# Patient Record
Sex: Female | Born: 1965 | Race: Black or African American | Hispanic: No | Marital: Single | Smoking: Never smoker
Health system: Southern US, Community
[De-identification: ages and names within clinical notes are randomized; demographics above are authoritative.]

## PROBLEM LIST (undated history)

## (undated) DIAGNOSIS — G5600 Carpal tunnel syndrome, unspecified upper limb: Secondary | ICD-10-CM

## (undated) DIAGNOSIS — I10 Essential (primary) hypertension: Secondary | ICD-10-CM

## (undated) DIAGNOSIS — K259 Gastric ulcer, unspecified as acute or chronic, without hemorrhage or perforation: Secondary | ICD-10-CM

## (undated) DIAGNOSIS — K219 Gastro-esophageal reflux disease without esophagitis: Secondary | ICD-10-CM

## (undated) DIAGNOSIS — F32A Depression, unspecified: Secondary | ICD-10-CM

## (undated) DIAGNOSIS — Z8619 Personal history of other infectious and parasitic diseases: Secondary | ICD-10-CM

## (undated) DIAGNOSIS — G4730 Sleep apnea, unspecified: Secondary | ICD-10-CM

## (undated) DIAGNOSIS — M779 Enthesopathy, unspecified: Secondary | ICD-10-CM

## (undated) DIAGNOSIS — M1990 Unspecified osteoarthritis, unspecified site: Secondary | ICD-10-CM

## (undated) DIAGNOSIS — R519 Headache, unspecified: Secondary | ICD-10-CM

## (undated) DIAGNOSIS — M722 Plantar fascial fibromatosis: Secondary | ICD-10-CM

## (undated) DIAGNOSIS — R51 Headache: Secondary | ICD-10-CM

## (undated) DIAGNOSIS — M7580 Other shoulder lesions, unspecified shoulder: Secondary | ICD-10-CM

## (undated) DIAGNOSIS — F419 Anxiety disorder, unspecified: Secondary | ICD-10-CM

## (undated) DIAGNOSIS — M7730 Calcaneal spur, unspecified foot: Secondary | ICD-10-CM

## (undated) DIAGNOSIS — F329 Major depressive disorder, single episode, unspecified: Secondary | ICD-10-CM

## (undated) DIAGNOSIS — F410 Panic disorder [episodic paroxysmal anxiety] without agoraphobia: Secondary | ICD-10-CM

## (undated) DIAGNOSIS — D573 Sickle-cell trait: Secondary | ICD-10-CM

## (undated) HISTORY — PX: WISDOM TOOTH EXTRACTION: SHX21

## (undated) HISTORY — DX: Calcaneal spur, unspecified foot: M77.30

## (undated) HISTORY — PX: CHOLECYSTECTOMY: SHX55

## (undated) HISTORY — DX: Carpal tunnel syndrome, unspecified upper limb: G56.00

## (undated) HISTORY — DX: Anxiety disorder, unspecified: F41.9

## (undated) HISTORY — DX: Gastric ulcer, unspecified as acute or chronic, without hemorrhage or perforation: K25.9

## (undated) HISTORY — DX: Personal history of other infectious and parasitic diseases: Z86.19

## (undated) HISTORY — DX: Plantar fascial fibromatosis: M72.2

## (undated) HISTORY — DX: Gastro-esophageal reflux disease without esophagitis: K21.9

---

## 2001-05-06 ENCOUNTER — Emergency Department (HOSPITAL_COMMUNITY): Admission: EM | Admit: 2001-05-06 | Discharge: 2001-05-06 | Payer: Self-pay | Admitting: Emergency Medicine

## 2001-06-30 ENCOUNTER — Emergency Department (HOSPITAL_COMMUNITY): Admission: EM | Admit: 2001-06-30 | Discharge: 2001-06-30 | Payer: Self-pay | Admitting: Emergency Medicine

## 2001-10-25 ENCOUNTER — Emergency Department (HOSPITAL_COMMUNITY): Admission: EM | Admit: 2001-10-25 | Discharge: 2001-10-26 | Payer: Self-pay | Admitting: Emergency Medicine

## 2001-10-26 ENCOUNTER — Encounter: Payer: Self-pay | Admitting: Emergency Medicine

## 2002-05-30 ENCOUNTER — Emergency Department (HOSPITAL_COMMUNITY): Admission: EM | Admit: 2002-05-30 | Discharge: 2002-05-30 | Payer: Self-pay | Admitting: *Deleted

## 2002-05-30 ENCOUNTER — Encounter: Payer: Self-pay | Admitting: *Deleted

## 2002-06-04 ENCOUNTER — Encounter: Admission: RE | Admit: 2002-06-04 | Discharge: 2002-06-04 | Payer: Self-pay | Admitting: Internal Medicine

## 2003-04-24 ENCOUNTER — Emergency Department (HOSPITAL_COMMUNITY): Admission: EM | Admit: 2003-04-24 | Discharge: 2003-04-24 | Payer: Self-pay | Admitting: Emergency Medicine

## 2003-06-08 ENCOUNTER — Emergency Department (HOSPITAL_COMMUNITY): Admission: EM | Admit: 2003-06-08 | Discharge: 2003-06-09 | Payer: Self-pay | Admitting: Emergency Medicine

## 2003-06-18 ENCOUNTER — Encounter: Admission: RE | Admit: 2003-06-18 | Discharge: 2003-06-18 | Payer: Self-pay | Admitting: Internal Medicine

## 2004-02-19 ENCOUNTER — Encounter: Admission: RE | Admit: 2004-02-19 | Discharge: 2004-02-19 | Payer: Self-pay | Admitting: Internal Medicine

## 2004-06-17 ENCOUNTER — Encounter: Admission: RE | Admit: 2004-06-17 | Discharge: 2004-06-17 | Payer: Self-pay | Admitting: Internal Medicine

## 2004-07-05 ENCOUNTER — Emergency Department (HOSPITAL_COMMUNITY): Admission: EM | Admit: 2004-07-05 | Discharge: 2004-07-05 | Payer: Self-pay | Admitting: Emergency Medicine

## 2004-08-29 HISTORY — PX: TUBAL LIGATION: SHX77

## 2004-09-08 DIAGNOSIS — Z87448 Personal history of other diseases of urinary system: Secondary | ICD-10-CM | POA: Insufficient documentation

## 2004-09-08 DIAGNOSIS — M722 Plantar fascial fibromatosis: Secondary | ICD-10-CM

## 2004-09-23 ENCOUNTER — Inpatient Hospital Stay (HOSPITAL_COMMUNITY): Admission: AD | Admit: 2004-09-23 | Discharge: 2004-09-23 | Payer: Self-pay | Admitting: Obstetrics and Gynecology

## 2005-01-06 ENCOUNTER — Emergency Department (HOSPITAL_COMMUNITY): Admission: EM | Admit: 2005-01-06 | Discharge: 2005-01-06 | Payer: Self-pay | Admitting: Family Medicine

## 2005-03-31 ENCOUNTER — Ambulatory Visit: Payer: Self-pay | Admitting: Internal Medicine

## 2005-04-30 ENCOUNTER — Emergency Department (HOSPITAL_COMMUNITY): Admission: EM | Admit: 2005-04-30 | Discharge: 2005-05-01 | Payer: Self-pay | Admitting: Emergency Medicine

## 2005-08-30 ENCOUNTER — Emergency Department (HOSPITAL_COMMUNITY): Admission: EM | Admit: 2005-08-30 | Discharge: 2005-08-30 | Payer: Self-pay | Admitting: Family Medicine

## 2006-12-13 ENCOUNTER — Emergency Department (HOSPITAL_COMMUNITY): Admission: EM | Admit: 2006-12-13 | Discharge: 2006-12-13 | Payer: Self-pay | Admitting: Family Medicine

## 2007-02-27 ENCOUNTER — Ambulatory Visit: Payer: Self-pay | Admitting: *Deleted

## 2007-03-01 ENCOUNTER — Ambulatory Visit: Payer: Self-pay | Admitting: Sports Medicine

## 2007-03-01 DIAGNOSIS — I10 Essential (primary) hypertension: Secondary | ICD-10-CM | POA: Insufficient documentation

## 2007-03-06 ENCOUNTER — Telehealth: Payer: Self-pay | Admitting: Sports Medicine

## 2007-03-15 ENCOUNTER — Ambulatory Visit: Payer: Self-pay | Admitting: Sports Medicine

## 2007-03-15 DIAGNOSIS — M25579 Pain in unspecified ankle and joints of unspecified foot: Secondary | ICD-10-CM

## 2007-03-29 ENCOUNTER — Telehealth: Payer: Self-pay | Admitting: *Deleted

## 2007-05-02 ENCOUNTER — Emergency Department (HOSPITAL_COMMUNITY): Admission: EM | Admit: 2007-05-02 | Discharge: 2007-05-02 | Payer: Self-pay | Admitting: Emergency Medicine

## 2007-05-02 ENCOUNTER — Telehealth: Payer: Self-pay | Admitting: *Deleted

## 2007-12-07 ENCOUNTER — Emergency Department (HOSPITAL_COMMUNITY): Admission: EM | Admit: 2007-12-07 | Discharge: 2007-12-07 | Payer: Self-pay | Admitting: Family Medicine

## 2008-04-05 ENCOUNTER — Telehealth: Payer: Self-pay | Admitting: *Deleted

## 2008-04-16 ENCOUNTER — Emergency Department (HOSPITAL_COMMUNITY): Admission: EM | Admit: 2008-04-16 | Discharge: 2008-04-16 | Payer: Self-pay | Admitting: Family Medicine

## 2008-04-23 ENCOUNTER — Ambulatory Visit: Payer: Self-pay | Admitting: Sports Medicine

## 2008-04-23 DIAGNOSIS — M7660 Achilles tendinitis, unspecified leg: Secondary | ICD-10-CM | POA: Insufficient documentation

## 2008-05-23 ENCOUNTER — Ambulatory Visit: Payer: Self-pay | Admitting: Internal Medicine

## 2008-05-23 DIAGNOSIS — R519 Headache, unspecified: Secondary | ICD-10-CM | POA: Insufficient documentation

## 2008-05-23 DIAGNOSIS — R51 Headache: Secondary | ICD-10-CM

## 2008-05-23 DIAGNOSIS — G4700 Insomnia, unspecified: Secondary | ICD-10-CM

## 2008-05-27 ENCOUNTER — Ambulatory Visit (HOSPITAL_COMMUNITY): Admission: RE | Admit: 2008-05-27 | Discharge: 2008-05-27 | Payer: Self-pay | Admitting: Internal Medicine

## 2008-05-28 ENCOUNTER — Encounter (INDEPENDENT_AMBULATORY_CARE_PROVIDER_SITE_OTHER): Payer: Self-pay | Admitting: Internal Medicine

## 2008-08-29 ENCOUNTER — Ambulatory Visit: Payer: Self-pay | Admitting: Sports Medicine

## 2008-09-23 ENCOUNTER — Ambulatory Visit: Payer: Self-pay | Admitting: Sports Medicine

## 2008-10-30 ENCOUNTER — Emergency Department (HOSPITAL_COMMUNITY): Admission: EM | Admit: 2008-10-30 | Discharge: 2008-10-31 | Payer: Self-pay | Admitting: Emergency Medicine

## 2009-04-04 ENCOUNTER — Emergency Department (HOSPITAL_COMMUNITY): Admission: EM | Admit: 2009-04-04 | Discharge: 2009-04-04 | Payer: Self-pay | Admitting: Emergency Medicine

## 2009-05-01 ENCOUNTER — Telehealth (INDEPENDENT_AMBULATORY_CARE_PROVIDER_SITE_OTHER): Payer: Self-pay | Admitting: *Deleted

## 2009-06-18 ENCOUNTER — Telehealth: Payer: Self-pay | Admitting: Sports Medicine

## 2009-07-30 ENCOUNTER — Ambulatory Visit: Payer: Self-pay | Admitting: Internal Medicine

## 2009-11-07 ENCOUNTER — Emergency Department (HOSPITAL_COMMUNITY): Admission: EM | Admit: 2009-11-07 | Discharge: 2009-11-07 | Payer: Self-pay | Admitting: Emergency Medicine

## 2009-12-19 ENCOUNTER — Emergency Department (HOSPITAL_COMMUNITY): Admission: EM | Admit: 2009-12-19 | Discharge: 2009-12-19 | Payer: Self-pay | Admitting: Emergency Medicine

## 2010-01-12 ENCOUNTER — Emergency Department (HOSPITAL_COMMUNITY): Admission: EM | Admit: 2010-01-12 | Discharge: 2010-01-12 | Payer: Self-pay | Admitting: Emergency Medicine

## 2010-01-19 ENCOUNTER — Emergency Department (HOSPITAL_COMMUNITY): Admission: EM | Admit: 2010-01-19 | Discharge: 2010-01-19 | Payer: Self-pay | Admitting: Emergency Medicine

## 2010-02-24 ENCOUNTER — Emergency Department (HOSPITAL_COMMUNITY): Admission: EM | Admit: 2010-02-24 | Discharge: 2010-02-24 | Payer: Self-pay | Admitting: Family Medicine

## 2010-07-02 ENCOUNTER — Emergency Department (HOSPITAL_COMMUNITY): Admission: EM | Admit: 2010-07-02 | Discharge: 2010-07-02 | Payer: Self-pay | Admitting: Family Medicine

## 2010-09-06 ENCOUNTER — Emergency Department (HOSPITAL_COMMUNITY): Admission: EM | Admit: 2010-09-06 | Discharge: 2010-09-07 | Payer: Self-pay | Admitting: Emergency Medicine

## 2010-12-30 ENCOUNTER — Emergency Department (HOSPITAL_COMMUNITY)
Admission: EM | Admit: 2010-12-30 | Discharge: 2010-12-30 | Disposition: A | Discharge status: Home / Self Care | Payer: Self-pay | Attending: Emergency Medicine | Admitting: Emergency Medicine

## 2010-12-30 DIAGNOSIS — J029 Acute pharyngitis, unspecified: Secondary | ICD-10-CM | POA: Insufficient documentation

## 2010-12-30 DIAGNOSIS — I10 Essential (primary) hypertension: Secondary | ICD-10-CM | POA: Insufficient documentation

## 2010-12-30 LAB — RAPID STREP SCREEN (MED CTR MEBANE ONLY): Streptococcus, Group A Screen (Direct): NEGATIVE

## 2011-01-05 LAB — POCT I-STAT, CHEM 8
BUN: 7 mg/dL (ref 6–23)
Chloride: 106 mEq/L (ref 96–112)
HCT: 38 % (ref 36.0–46.0)
Potassium: 3.6 mEq/L (ref 3.5–5.1)

## 2011-01-05 LAB — POCT CARDIAC MARKERS
CKMB, poc: <1 ng/mL — ABNORMAL LOW (ref 1.0–8.0)
Myoglobin, poc: 52.6 ng/mL (ref 12–200)
Troponin i, poc: <0.05 ng/mL (ref 0.00–0.09)

## 2011-01-05 LAB — DIFFERENTIAL
Basophils Absolute: 0 10*3/uL (ref 0.0–0.1)
Basophils Relative: 1 % (ref 0–1)
Lymphocytes Relative: 30 % (ref 12–46)
Monocytes Absolute: 0.6 10*3/uL (ref 0.1–1.0)
Monocytes Relative: 7 % (ref 3–12)
Neutro Abs: 4.6 10*3/uL (ref 1.7–7.7)
Neutrophils Relative %: 60 % (ref 43–77)

## 2011-01-05 LAB — CBC
HCT: 35 % — ABNORMAL LOW (ref 36.0–46.0)
Hemoglobin: 11.5 g/dL — ABNORMAL LOW (ref 12.0–15.0)
MCHC: 32.9 g/dL (ref 30.0–36.0)
RDW: 16.4 % — ABNORMAL HIGH (ref 11.5–15.5)
WBC: 7.7 10*3/uL (ref 4.0–10.5)

## 2011-01-07 LAB — WET PREP, GENITAL
Trich, Wet Prep: NONE SEEN
Yeast Wet Prep HPF POC: NONE SEEN

## 2011-01-07 LAB — POCT URINALYSIS DIPSTICK
Hgb urine dipstick: NEGATIVE
Ketones, ur: NEGATIVE mg/dL
Protein, ur: NEGATIVE mg/dL
Specific Gravity, Urine: 1.015 (ref 1.005–1.030)
Urobilinogen, UA: 0.2 mg/dL (ref 0.0–1.0)
pH: 7 (ref 5.0–8.0)

## 2011-01-07 LAB — POCT PREGNANCY, URINE: Preg Test, Ur: NEGATIVE

## 2011-03-12 ENCOUNTER — Encounter: Payer: Self-pay | Admitting: Internal Medicine

## 2011-07-16 LAB — POCT URINALYSIS DIP (DEVICE)
Nitrite: NEGATIVE
Operator id: 247071
Protein, ur: NEGATIVE
Specific Gravity, Urine: 1.015
Urobilinogen, UA: 0.2

## 2011-07-16 LAB — URINE CULTURE

## 2012-11-06 ENCOUNTER — Emergency Department (HOSPITAL_COMMUNITY): Payer: Self-pay

## 2012-11-06 ENCOUNTER — Encounter (HOSPITAL_COMMUNITY): Payer: Self-pay

## 2012-11-06 ENCOUNTER — Emergency Department (HOSPITAL_COMMUNITY)
Admission: EM | Admit: 2012-11-06 | Discharge: 2012-11-06 | Disposition: A | Discharge status: Home / Self Care | Payer: Self-pay | Attending: Emergency Medicine | Admitting: Emergency Medicine

## 2012-11-06 DIAGNOSIS — Y929 Unspecified place or not applicable: Secondary | ICD-10-CM | POA: Insufficient documentation

## 2012-11-06 DIAGNOSIS — S86019A Strain of unspecified Achilles tendon, initial encounter: Secondary | ICD-10-CM

## 2012-11-06 DIAGNOSIS — Y939 Activity, unspecified: Secondary | ICD-10-CM | POA: Insufficient documentation

## 2012-11-06 DIAGNOSIS — Z8739 Personal history of other diseases of the musculoskeletal system and connective tissue: Secondary | ICD-10-CM | POA: Insufficient documentation

## 2012-11-06 DIAGNOSIS — W19XXXA Unspecified fall, initial encounter: Secondary | ICD-10-CM | POA: Insufficient documentation

## 2012-11-06 DIAGNOSIS — S93499A Sprain of other ligament of unspecified ankle, initial encounter: Secondary | ICD-10-CM | POA: Insufficient documentation

## 2012-11-06 HISTORY — DX: Other shoulder lesions, unspecified shoulder: M75.80

## 2012-11-06 HISTORY — DX: Enthesopathy, unspecified: M77.9

## 2012-11-06 MED ORDER — HYDROCODONE-ACETAMINOPHEN 5-325 MG PO TABS
2.0000 | ORAL_TABLET | Freq: Once | ORAL | Status: AC
  Administered 2012-11-06: 2 via ORAL
  Filled 2012-11-06: qty 2

## 2012-11-06 MED ORDER — HYDROCODONE-ACETAMINOPHEN 5-325 MG PO TABS: 2.0000 | ORAL_TABLET | ORAL | Status: DC | PRN

## 2012-11-06 NOTE — ED Notes (Signed)
Paged ortho 

## 2012-11-06 NOTE — Progress Notes (Signed)
Orthopedic Tech Progress Note Patient Details:  Amy Perez Jan 30, 1966 [id number]  Ortho Devices Type of Ortho Device: CAM walker Ortho Device/Splint Location: (R) LE Ortho Device/Splint Interventions: Application   Jennye Moccasin 11/06/2012, 6:28 PM

## 2012-11-06 NOTE — ED Notes (Signed)
Pt states she fell last week and thought her foot would get better but presents for increase in pain to her right foot and pain in both hands with hx of tendonitis. Has bone spurs so unsure if it made it worse in the foot.

## 2012-11-06 NOTE — ED Notes (Signed)
Ortho tech at bedside 

## 2012-11-06 NOTE — ED Notes (Addendum)
Pt given d/c teaching and prescriptions. Pt verbalizes understanding of d/c teaching and instructions on follow up care tomorrow. Pt has no further questions upon d/c and does not appear to be in acute distress upon d/c. Pt instructed not to drive due to pain medication and states nephew will be picking her up from ED.

## 2012-11-06 NOTE — ED Provider Notes (Addendum)
History   This chart was scribed for Doug Sou, MD by Gerlean Ren, ED Scribe. This patient was seen in room TR07C/TR07C and the patient's care was started at 5:53 PM    CSN: [id number]  Arrival date & time 11/06/12  1516   None     Chief Complaint  Patient presents with  . Foot Pain     The history is provided by the patient. No language interpreter was used.   Amy Perez is a 47 y.o. female who presents to the Emergency Department complaining of constant, gradually worsening, non-radiating right heel pain after falling backwards last week that was initially painful but pt was not concerned due to h/o painful heel spurs.  Pain is worse with weightbearing to improve by Pt denies any further injury as result of fall.  Pt has percocet and benadryl allergies.  Pt denies tobacco and alcohol use.   Past Medical History  Diagnosis Date  . AC (acromioclavicular) joint bone spurs   . Tendonitis     Past Surgical History  Procedure Date  . Tubal ligation 08/29/04  . Cholecystectomy     No family history on file.  History  Substance Use Topics  . Smoking status: Never Smoker   . Smokeless tobacco: Not on file  . Alcohol Use: No    No OB history provided.   Review of Systems  Constitutional: Negative.   HENT: Negative.   Respiratory: Negative.   Cardiovascular: Negative.   Gastrointestinal: Negative.   Musculoskeletal:       Right heel pain  Skin: Negative.   Neurological: Negative.   Hematological: Negative.   Psychiatric/Behavioral: Negative.     Allergies  Benadryl; Oxycodone-acetaminophen; and Penicillins Percocet causes nausea Benadryl causes itching patient can take hydrocodone without problem Home Medications   Current Outpatient Rx  Name  Route  Sig  Dispense  Refill  . IBUPROFEN 200 MG PO TABS   Oral   Take 600 mg by mouth every 6 (six) hours as needed. For pain           BP 163/107  Pulse 100  Temp 98.1 F (36.7 C) (Oral)  SpO2 100%   LMP 10/23/2012  Physical Exam  Nursing note and vitals reviewed. Constitutional: She appears well-developed and well-nourished.  HENT:  Head: Normocephalic and atraumatic.  Eyes: Conjunctivae normal are normal. Pupils are equal, round, and reactive to light.  Neck: Neck supple. No tracheal deviation present. No thyromegaly present.  Cardiovascular: Normal rate and regular rhythm.   No murmur heard. Pulmonary/Chest: Effort normal and breath sounds normal.  Abdominal: Soft. Bowel sounds are normal. She exhibits no distension. There is no tenderness.  Musculoskeletal: Normal range of motion. She exhibits no edema and no tenderness.       Right lower extremity skin intact tender and swollen ecchymotic overlying the Achilles tendon positive Thompson's sign DP pulse 2+ all other extremities atraumatic, neurovascularly intact  Neurological: She is alert. Coordination normal.  Skin: Skin is warm and dry. No rash noted.  Psychiatric: She has a normal mood and affect.    ED Course  Procedures (including critical care time) DIAGNOSTIC STUDIES: Oxygen Saturation is 100% on room air, normal by my interpretation.    COORDINATION OF CARE: 5:56 PM- Patient informed of clinical course, understands medical decision-making process, and agrees with plan.   Dg Ankle Complete Right  11/06/2012  *RADIOLOGY REPORT*  Clinical Data: Pain and swelling after fall  RIGHT ANKLE - COMPLETE 3+ VIEW  Comparison: None.  Findings: There is regional soft tissue swelling.  There is an excavatum defect of the calcaneus at the site of the Achilles attachment with calcification and 2 cm proximal that.  This suggests Achilles rupture and a avulsion.  No fracture of the tibia, fibula or talus.  IMPRESSION: Suspicion of Achilles rupture/ avulsion.   Original Report Authenticated By: Paulina Fusi, M.D.   640 pmPatient comfortable in cam walker   No diagnosis found.  X-ray reviewed by me MDM  Case discussed with Dr.  Shon Baton plan Cam Dan Humphreys Prescription Norco patient to follow up with Dr.Hewitt tomorrow in the office. Patient should also have repeat blood pressure check one week Diagnosis 1 Achilles tendon rupture #2 elevated blood pressure   I personally performed the services described in this documentation, which was scribed in my presence. The recorded information has been reviewed and is accurate.         Doug Sou, MD 11/06/12 1844  Doug Sou, MD 11/06/12 [phone]

## 2013-12-15 ENCOUNTER — Encounter (HOSPITAL_COMMUNITY): Payer: Self-pay | Admitting: Emergency Medicine

## 2013-12-15 ENCOUNTER — Emergency Department (HOSPITAL_COMMUNITY)
Admission: EM | Admit: 2013-12-15 | Discharge: 2013-12-15 | Disposition: A | Discharge status: Home / Self Care | Payer: Self-pay | Attending: Emergency Medicine | Admitting: Emergency Medicine

## 2013-12-15 DIAGNOSIS — M25579 Pain in unspecified ankle and joints of unspecified foot: Secondary | ICD-10-CM | POA: Insufficient documentation

## 2013-12-15 DIAGNOSIS — Z880 Allergy status to penicillin: Secondary | ICD-10-CM | POA: Insufficient documentation

## 2013-12-15 DIAGNOSIS — L02619 Cutaneous abscess of unspecified foot: Secondary | ICD-10-CM | POA: Insufficient documentation

## 2013-12-15 DIAGNOSIS — L03039 Cellulitis of unspecified toe: Principal | ICD-10-CM | POA: Insufficient documentation

## 2013-12-15 DIAGNOSIS — L0390 Cellulitis, unspecified: Secondary | ICD-10-CM

## 2013-12-15 MED ORDER — CEPHALEXIN 500 MG PO CAPS: 500.0000 mg | ORAL_CAPSULE | Freq: Four times a day (QID) | ORAL | Status: DC

## 2013-12-15 NOTE — Discharge Instructions (Signed)
Cellulitis Cellulitis is an infection of the skin and the tissue beneath it. The infected area is usually red and tender. Cellulitis occurs most often in the arms and lower legs.  CAUSES  Cellulitis is caused by bacteria that enter the skin through cracks or cuts in the skin. The most common types of bacteria that cause cellulitis are Staphylococcus and Streptococcus. SYMPTOMS   Redness and warmth.  Swelling.  Tenderness or pain.  Fever. DIAGNOSIS  Your caregiver can usually determine what is wrong based on a physical exam. Blood tests may also be done. TREATMENT  Treatment usually involves taking an antibiotic medicine. HOME CARE INSTRUCTIONS   Take your antibiotics as directed. Finish them even if you start to feel better.  Keep the infected arm or leg elevated to reduce swelling.  Apply a warm cloth to the affected area up to 4 times per day to relieve pain.  Only take over-the-counter or prescription medicines for pain, discomfort, or fever as directed by your caregiver.  Keep all follow-up appointments as directed by your caregiver. SEEK MEDICAL CARE IF:   You notice red streaks coming from the infected area.  Your red area gets larger or turns dark in color.  Your bone or joint underneath the infected area becomes painful after the skin has healed.  Your infection returns in the same area or another area.  You notice a swollen bump in the infected area.  You develop new symptoms. SEEK IMMEDIATE MEDICAL CARE IF:   You have a fever.  You feel very sleepy.  You develop vomiting or diarrhea.  You have a general ill feeling (malaise) with muscle aches and pains. MAKE SURE YOU:   Understand these instructions.  Will watch your condition.  Will get help right away if you are not doing well or get worse. Document Released: 07/21/2005 Document Revised: 04/11/2012 Document Reviewed: 12/27/2011 [employer] Patient Information 2014 [employer].  

## 2013-12-15 NOTE — ED Notes (Signed)
Pt states she thinks something may have bitten her R great toe, she noticed redness and swelling to the toe 2 days ago and its gotten worse since.

## 2013-12-15 NOTE — ED Notes (Signed)
Pt presents with white growth in between toes. Great toe on right foot has large pus pocked on top of toe and toe nail is darkened. Toes and foot are reddened and swollen; pt is able to move but with discomfort. Pt is no diabetic.

## 2013-12-15 NOTE — ED Notes (Signed)
PT ambulated with baseline gait; VSS; A&Ox3; no signs of distress; respirations even and unlabored; skin warm and dry; no questions upon discharge.  

## 2013-12-15 NOTE — ED Provider Notes (Signed)
CSN: [id number]     Arrival date & time 12/15/13  1559 History  This chart was scribed for non-physician practitioner, Amy Perez, working with Hurman HornJohn M Bednar, MD by Smiley HousemanFallon Davis, ED Scribe. This patient was seen in room TR07C/TR07C and the patient's care was started at 7:32 PM.   Chief Complaint  Patient presents with  . Toe Pain   The history is provided by the patient. No language interpreter was used.   HPI Comments: Amy Perez is a 48 y.o. female who presents to the Emergency Department complaining of constant worsening pain in her right great toe that started about 2 days ago.  Pt states she felt pain when she was putting on her shoes, but she thought it was a scratch or a bite.  Pt denies fever, chills, nausea, and emesis.  Pt denies diagnosis of diabetes.  Pt states her toe nail is discolored and would like something for her toenail.  Pt [place] [facility] [place] [facility], due to insurance change.     Past Medical History  Diagnosis Date  . AC (acromioclavicular) joint bone spurs   . Tendonitis    Past Surgical History  Procedure Laterality Date  . Tubal ligation  08/29/04  . Cholecystectomy     History reviewed. No pertinent family history. History  Substance Use Topics  . Smoking status: Never Smoker   . Smokeless tobacco: Not on file  . Alcohol Use: No   OB History   Grav Para Term Preterm Abortions TAB SAB Ect Mult Living                 Review of Systems  Constitutional: Negative for fever and chills.  Respiratory: Negative for shortness of breath.   Cardiovascular: Negative for chest pain.  Gastrointestinal: Negative for nausea, vomiting, abdominal pain and diarrhea.  Musculoskeletal: Positive for arthralgias (Right great toe).  Skin: Negative for color change and rash.  Psychiatric/Behavioral: Negative for behavioral problems and confusion.  All other systems reviewed and are negative.   Allergies  Amoxicillin; Benadryl;  Oxycodone-acetaminophen; and Penicillins  Home Medications   Current Outpatient Rx  Name  Route  Sig  Dispense  Refill  . naproxen sodium (ANAPROX) 220 MG tablet   Oral   Take 440 mg by mouth daily as needed (foot pain).          Triage Vitals: BP 146/87  Pulse 111  Temp(Src) 98.8 F (37.1 C) (Oral)  Resp 18  Ht 5\' 2"  (1.575 m)  Wt 270 lb (122.471 kg)  BMI 49.37 kg/m2  SpO2 98%  Physical Exam  Nursing note and vitals reviewed. Constitutional: She is oriented to person, place, and time. She appears well-developed and well-nourished. No distress.  HENT:  Head: Normocephalic and atraumatic.  Eyes: EOM are normal.  Neck: Neck supple. No tracheal deviation present.  Cardiovascular: Normal rate.   Pulmonary/Chest: Effort normal. No respiratory distress.  Musculoskeletal: Normal range of motion.  Neurological: She is alert and oriented to person, place, and time.  Skin: Skin is warm and dry.  0.25 cm pustule on the dorsal aspect of the right great toe with mild surrounding erythema.  No streaking.  No evidence of felon or paronychia.      Psychiatric: She has a normal mood and affect. Her behavior is normal.    ED Course  Procedures (including critical care time) DIAGNOSTIC STUDIES: Oxygen Saturation is 98% on RA, normal by my interpretation.    COORDINATION OF CARE:  7:38 PM-Will drain white head located on right great toe.  Patient informed of current plan of treatment and evaluation and agrees with plan.    INCISION AND DRAINAGE PROCEDURE NOTE: Patient identification was confirmed and verbal consent was obtained. This procedure was performed by Amy Canada  at 7:40 PM. Site: Right great toe Needle size: none Anesthetic used (type and amt): none Blade size: 11 Drainage: mild Complexity: simple Packing used: none Site anesthetized, incision made over site, wound drained and explored loculations, rinsed with copious amounts of normal saline, wound packed with  sterile gauze, covered with dry, sterile dressing.  Pt tolerated procedure well without complications.  Instructions for care discussed verbally and pt provided with additional written instructions for homecare and f/u.   MDM   Pt was encouraged to take the antibiotic as directed and return for new or worsening symptoms.  Pt treated for a mild cellulitis. Pt is not diabetic.  Discharged to home in good condition.  Return precautions given.    Final diagnoses:  Cellulitis    I personally performed the services described in this documentation, which was scribed in my presence. The recorded information has been reviewed and is accurate.       Roxy Horseman, PA-C 12/15/13 2235

## 2013-12-16 NOTE — ED Provider Notes (Signed)
Medical screening examination/treatment/procedure(s) were performed by non-physician practitioner and as supervising physician I was immediately available for consultation/collaboration.   Barry Faircloth M Keagan Anthis, MD 12/16/13 0240 

## 2014-08-01 ENCOUNTER — Emergency Department (HOSPITAL_COMMUNITY)
Admission: EM | Admit: 2014-08-01 | Discharge: 2014-08-01 | Disposition: A | Discharge status: Home / Self Care | Payer: Self-pay | Attending: Emergency Medicine | Admitting: Emergency Medicine

## 2014-08-01 ENCOUNTER — Encounter (HOSPITAL_COMMUNITY): Payer: Self-pay | Admitting: Emergency Medicine

## 2014-08-01 DIAGNOSIS — G5603 Carpal tunnel syndrome, bilateral upper limbs: Secondary | ICD-10-CM

## 2014-08-01 DIAGNOSIS — Z880 Allergy status to penicillin: Secondary | ICD-10-CM | POA: Insufficient documentation

## 2014-08-01 DIAGNOSIS — Z791 Long term (current) use of non-steroidal anti-inflammatories (NSAID): Secondary | ICD-10-CM | POA: Insufficient documentation

## 2014-08-01 DIAGNOSIS — G5601 Carpal tunnel syndrome, right upper limb: Secondary | ICD-10-CM | POA: Insufficient documentation

## 2014-08-01 DIAGNOSIS — G5602 Carpal tunnel syndrome, left upper limb: Secondary | ICD-10-CM | POA: Insufficient documentation

## 2014-08-01 NOTE — ED Provider Notes (Signed)
CSN: [id number]     Arrival date & time 08/01/14  1554 History   First MD Initiated Contact with Patient 08/01/14 [id number]     Chief Complaint  Patient presents with  . Hand Pain     (Consider location/radiation/quality/duration/timing/severity/associated sxs/prior Treatment) The history is provided by the patient. No language interpreter was used.  Amy Perez is a 48 y/o F with PMHx of AC joint spurs and tendonitis presenting to the ED with bilateral hand pain that has been ongoing for the past year. Patient reported that the pain occurs at night and that it is described as a burning, pins and needles sensation -localized to the hands, denied radiation elsewhere. Stated that the pain also occurs at night. 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 Stated that she was seen and assessed at an Urgent Care Center a couple of months ago where she was given braces that aided in her discomfort. Stated that she lost them. Patient reported that she is right-hand dominant. Denied seeing a specialist regarding these issues. Denied injury, fall, neck pain, neck stiffness. PCP none  Past Medical History  Diagnosis Date  . AC (acromioclavicular) joint bone spurs   . Tendonitis    Past Surgical History  Procedure Laterality Date  . Tubal ligation  08/29/04  . Cholecystectomy     History reviewed. No pertinent family history. History  Substance Use Topics  . Smoking status: Never Smoker   . Smokeless tobacco: Not on file  . Alcohol Use: No   OB History   Grav Para Term Preterm Abortions TAB SAB Ect Mult Living                 Review of Systems  Musculoskeletal: Positive for arthralgias (bilateral hands).  Neurological: Negative for weakness and numbness.      Allergies  Amoxicillin; Benadryl; Oxycodone-acetaminophen; and Penicillins  Home Medications   Prior to Admission medications   Medication Sig Start Date End Date Taking? Authorizing  Provider  naproxen sodium (ANAPROX) 220 MG tablet Take 440 mg by mouth daily as needed (foot pain).   Yes Historical Provider, MD   BP 142/90  Pulse 89  Temp(Src) 98.2 F (36.8 C) (Oral)  SpO2 100%  LMP 07/18/2014 Physical Exam  Nursing note and vitals reviewed. Constitutional: She is oriented to person, place, and time. She appears well-developed and well-nourished. No distress.  HENT:  Head: Normocephalic and atraumatic.  Eyes: Conjunctivae and EOM are normal. Right eye exhibits no discharge. Left eye exhibits no discharge.  Neck: Normal range of motion. Neck supple. No tracheal deviation present.  Cardiovascular: Normal rate, regular rhythm and normal heart sounds.  Exam reveals no friction rub.   No murmur heard. Pulses:      Radial pulses are 2+ on the right side, and 2+ on the left side.  Cap refill less than 3 seconds  Pulmonary/Chest: Effort normal and breath sounds normal. No respiratory distress. She has no wheezes. She has no rales.  Musculoskeletal: Normal range of motion. She exhibits no tenderness.  Negative deformities, malalignments, lesions, sores, deformities, ecchymosis identified to the hands bilaterally. Negative swelling, erythema, inflammation, warmth upon palpation. Negative snuffbox tenderness. Full range of motion to digits of the hands bilaterally, wrists bilaterally, elbows bilaterally-, full range of motion to upper extremities bilaterally. Patient is able to produce a fist bilaterally. Positive Tinel sign bilaterally.  Lymphadenopathy:    She has no cervical adenopathy.  Neurological:  She is alert and oriented to person, place, and time. No cranial nerve deficit. She exhibits normal muscle tone. Coordination normal.  Cranial nerves III-XII grossly intact Strength 5+/5+ to upper extremities bilaterally with resistance applied, equal distribution noted Equal grip strength bilaterally Sensation intact with differentiation sharp and dull touch Strength intact  to MCP, PIP, DIP joints of the hands bilaterally 2 point discrimination bilaterally  Skin: Skin is warm and dry. No rash noted. She is not diaphoretic. No erythema.  Psychiatric: She has a normal mood and affect. Her behavior is normal. Thought content normal.    ED Course  Procedures (including critical care time) Labs Review Labs Reviewed - No data to display  Imaging Review No results found.   EKG Interpretation None      MDM   Final diagnoses:  Bilateral carpal tunnel syndrome    Medications - No data to display  Filed Vitals:   08/01/14 1600 08/01/14 1703 08/01/14 1728  BP: 172/111 142/90   Pulse: 108 89   Temp: 98.4 F (36.9 C)  98.2 F (36.8 C)  TempSrc: Oral  Oral  SpO2: 100% 100%     Doubt septic joint. Negative findings of ischemia. Patient neurovascularly intact. Full range of motion to the hands bilaterally. Strength intact. 2 point discrimination intact. Suspicion to be possible carpal tunnel syndrome. Patient stable, afebrile. Patient not septic appearing. Discharged patient. Patient placed in wrist braces bilaterally-discussed with patient to wear these at night. [facility] to [facility] and hand specialist. Discussed with patient to rest ice and elevate as well as massage. Discussed with patient to closely monitor symptoms and if symptoms are to worsen or change to report back to the ED - strict return instructions given.  Patient agreed to plan of care, understood, all questions answered.   Raymon Mutton, PA-C 08/01/14 1747

## 2014-08-01 NOTE — Discharge Instructions (Signed)
Please call your doctor for a followup appointment within 24-48 hours. When you talk to your doctor please let them know that you were seen in the emergency department and have them acquire all of your records so that they can discuss the findings with you and formulate a treatment plan to fully care for your new and ongoing problems. Please call and set up an appointment with hand specialist, Dr. Cheree DittoGraham neck Please wear wrist braces at night Please massage with icy hot ointment Please continue to monitor symptoms closely and if symptoms are to worsen or change (fever greater than 101, chills, sweating, nausea, vomiting, chest pain, shortness of breathe, difficulty breathing, weakness, numbness, tingling, worsening or changes to pain pattern, fall, injury, swelling to the hand, red streaks, dropping objects due to weakness in the hands) please report back to the Emergency Department immediately.    Carpal Tunnel Syndrome Carpal tunnel syndrome is a disorder of the nervous system in the wrist that causes pain, hand weakness, and/or loss of feeling. Carpal tunnel syndrome is caused by the compression, stretching, or irritation of the median nerve at the wrist joint. Athletes who experience carpal tunnel syndrome may notice a decrease in their performance to the condition, especially for sports that require strong hand or wrist action.  SYMPTOMS   Tingling, numbness, or burning pain in the hand or fingers.  Inability to sleep due to pain in the hand.  Sharp pains that shoot from the wrist up the arm or to the fingers, especially at night.  Morning stiffness or cramping of the hand.  Thumb weakness, resulting in difficulty holding objects or making a fist.  Shiny, dry skin on the hand.  Reduced performance in any sport requiring a strong grip. CAUSES   Median nerve damage at the wrist is caused by pressure due to swelling, inflammation, or scarred tissue.  Sources of pressure  include:  Repetitive gripping or squeezing that causes inflammation of the tendon sheaths.  Scarring or shortening of the ligament that covers the median nerve.  Traumatic injury to the wrist or forearm such as fracture, sprain, or dislocation.  Prolonged hyperextension (wrist bent backward) or hyperflexion (wrist bent downward) of the wrist. RISK INCREASES WITH:  Diabetes mellitus.  Menopause or amenorrhea.  Rheumatoid arthritis.  Raynaud disease.  Pregnancy.  Gout.  Kidney disease.  Ganglion cyst.  Repetitive hand or wrist action.  Hypothyroidism (underactive thyroid gland).  Repetitive jolting or shaking of the hands or wrist.  Prolonged forceful weight-bearing on the hands. PREVENTION  Bracing the hand and wrist straight during activities that involve repetitive grasping.  For activities that require prolonged extension of the wrist (bending towards the top of the forearm) periodically change the position of your wrists.  Learn and use proper technique in activities that result in the wrist position in neutral to slight extension.  Avoid bending the wrist into full extension or flexion (up or down).  Keep the wrist in a straight (neutral) position. To keep the wrist in this position, wear a splint.  Avoid repetitive hand and wrist motions.  When possible avoid prolonged grasping of items (steering wheel of a car, a pen, a vacuum cleaner, or a rake).  Loosen your grip for activities that require prolonged grasping of items.  Place keyboards and writing surfaces at the correct height as to decrease strain on the wrist and hand.  Alternate work tasks to avoid prolonged wrist flexion.  Avoid pinching activities (needlework and writing) as they may irritate your  carpal tunnel syndrome.  If these activities are necessary, complete them for shorter periods of time.  When writing, use a felt tip or rollerball pen and/or build up the grip on a pen to decrease  the forces required for writing. PROGNOSIS  Carpal tunnel syndrome is usually curable with appropriate conservative treatment and sometimes resolves spontaneously. For some cases, surgery is necessary, especially if muscle wasting or nerve changes have developed.  RELATED COMPLICATIONS   Permanent numbness and a weak thumb or fingers in the affected hand.  Permanent paralysis of a portion of the hand and fingers. TREATMENT  Treatment initially consists of stopping activities that aggravate the symptoms as well as medication and ice to reduce inflammation. A wrist splint is often recommended for wear during activities of repetitive motion as well as at night. It is also important to learn and use proper technique when performing activities that typically cause pain. On occasion, a corticosteroid injection may be given. If symptoms persist despite conservative treatment, surgery may be an option. Surgical techniques free the pinched or compressed nerve. Carpal tunnel surgery is usually performed on an outpatient basis, meaning you go home the same day as surgery. These procedures provide almost complete relief of all symptoms in 95% of patients. Expect at least 2 weeks for healing after surgery. For cases that are the result of repeated jolting or shaking of the hand or wrist or prolonged hyperextension, surgery is not usually recommended because stretching of the median nerve, not compression, is usually the cause of carpal tunnel syndrome in these cases. MEDICATION   If pain medication is necessary, nonsteroidal anti-inflammatory medications, such as aspirin and ibuprofen, or other minor pain relievers, such as acetaminophen, are often recommended.  Do not take pain medication for 7 days before surgery.  Prescription pain relievers are usually only prescribed after surgery. Use only as directed and only as much as you need.  Corticosteroid injections may be given to reduce inflammation. However,  they are not always recommended.  Vitamin B6 (pyridoxine) may reduce symptoms; use only if prescribed for your disorder. SEEK MEDICAL CARE IF:   Symptoms get worse or do not improve in 2 weeks despite treatment.  You also have a current or recent history of neck or shoulder injury that has resulted in pain or tingling elsewhere in your arm. Document Released: 10/11/2005 Document Revised: 02/25/2014 Document Reviewed: 01/23/2009 [facility] Patient Information 2015 [place], [place]. This information is not intended to replace advice given to you by your health care provider. Make sure you discuss any questions you have with your health care provider.   Emergency Department Resource Guide 1) Find a Doctor and Pay Out of Pocket Although you won't have to find out who is covered by your insurance plan, it is a good idea to ask around and get recommendations. You will then need to call the office and see if the doctor you have chosen will accept you as a new patient and what types of options they offer for patients who are self-pay. Some doctors offer discounts or will set up payment plans for their patients who do not have insurance, but you will need to ask so you aren't surprised when you get to your appointment.  2) Contact Your Local Health Department Not all health departments have doctors that can see patients for sick visits, but many do, so it is worth a call to see if yours does. If you don't know where your local health department is, you can check  in your phone book. The CDC also has a tool to help you locate your state's health department, and many state websites also have listings of all of their local health departments.  3) Find a Walk-in Clinic If your illness is not likely to be very severe or complicated, you may want to try a walk in clinic. These are popping up all over the country in pharmacies, drugstores, and shopping centers. They're usually staffed by nurse practitioners or  physician assistants that have been trained to treat common illnesses and complaints. They're usually fairly quick and inexpensive. However, if you have serious medical issues or chronic medical problems, these are probably not your best option.  No Primary Care Doctor: - Call Health Connect at  [phone] - they can help you locate a primary care doctor that  accepts your insurance, provides certain services, etc. - Physician Referral Service- [phone]  Chronic Pain Problems: Organization         Address  Phone   Notes  [facility]  [phone] Patients need to be referred by their primary care doctor.   Medication Assistance: Organization         Address  Phone   Notes  [facility] Medication [facility] [address] [place] [place], [place] [address] [phone] --Must be a resident of [facility] -- Must have NO insurance coverage whatsoever (no Medicaid/ Medicare, etc.) -- The pt. MUST have a primary care doctor that directs their care regularly and follows them in the community   [facility]ist  [phone]   [employer]  [phone]    Agencies that provide inexpensive medical care: Organization         Address  Phone   Notes  [facility]  [phone]   [facility]    [phone]   [facility] [address] [place], [place] [address] [phone]   [facility] [address] [place], [place] [phone]   Planned Parenthood    [phone]   [facility]    [phone]   Community Health and [facility]  [address], [place] Phone:  [phone], Fax:  [phone] Hours of Operation:  9 am - 6 pm, M-F.  Also accepts Medicaid/Medicare and self-pay.  [facility]  [address], [place] Phone: [phone], Fax: [phone]. Hours of Operation:  8:30 am - 5:30 pm, M-F.   Also accepts Medicaid and self-pay.  [facility] [place] [address] [place] Phone: [phone]   [facility] [address] Natasha Bence [place], [place] [phone], Ext. [phone] Mondays & Thursdays: 7-9 AM.  First 15 patients are seen on a first come, first serve basis.    Medicaid-accepting [facility] Providers:  Organization         Address  Phone   Notes  [facility] [address], [place] [phone] Also accepts self-pay patients.  [facility] [address] Laurell Josephs [place], [place]  [phone]   [facility] [address], [place] [phone]   [facility] [address] [place] [phone]   Renaye Rakers [address], [place]   [phone] Only accepts [place] Access [place] patients after they have their name applied to their  card.   Self-Pay (no insurance) in [facility]:  Organization         Address  Phone   Notes  Sickle Cell Patients, [facility] [place] [phone]   [facility]gent Care [place] [phone]   [facility] [address], [place] [phone]   [facility]/Dr. Osei-Bonsu  [address], [place] or [address], [place] [phone] Phone number for both [place] and [place] locations is the same.  Urgent Medical and [facility] [address] [place] [phone]   [facility] [address] [place] or [address] [phone]   [facility] [address] [place] [phone], phone; [phone], fax Sees patients 1st and 3rd Saturday of every month.  Must not qualify for public or private insurance (i.e. Medicaid, Medicare, [place] Health Choice, Veterans'  Benefits)  Household income should be no more than 200% of the poverty level The clinic cannot treat you if you are pregnant or think you are pregnant  Sexually transmitted diseases are not treated at the clinic.    Dental Care: Organization         Address  Phone  Notes  [facility] Department of [facility] [place] [phone] Accepts children up to age 50 who are enrolled in [place] or [place]; pregnant women with a Medicaid card; and children who have applied for Medicaid or [place] Health Choice, but were declined, whose parents can pay a reduced fee at time of service.  [facility] of [facility]  [address], [place] [phone] Accepts children up to age 48 who are enrolled in [place] or [facility] [facility]; pregnant women with a Medicaid card; and children who have applied for Medicaid or [place] Health Choice, but were declined, whose parents can pay a reduced fee at time of service.  [place] Adult [facility] Access PROGRAM  [place] [phone] Patients are seen by appointment only. Walk-ins are not accepted. [place] will see patients 40 years of age and older. Monday - Tuesday (8am-5pm) Most Wednesdays (8:30-5pm) $30 per visit, cash only  [facility] Adult [facility] Access PROGRAM  [address] [facility] [phone] Patients are seen by appointment only. Walk-ins are not accepted. [place] will see patients 81 years of age and older. One Wednesday Evening (Monthly: Volunteer Based).  $30 per visit, cash only  [place]  [phone] for adults; Children under age 76, call Graduate Pediatric Dentistry at [phone]. Children aged 11-14, please call [phone] to request a pediatric application.  Dental services are provided in all areas of dental care including fillings, crowns and bridges, complete and partial  dentures, implants, gum treatment, root canals, and extractions. Preventive care is also provided. Treatment is provided to both adults and children. Patients are selected via a lottery and there is often a waiting list.   [facility] [address], [place]  [phone] [email or web address]   [facility] [address] [place], [place] [phone], Ext. [phone] Second and Fourth Thursday of each month, opens at 6:30 AM; Clinic ends at 9 AM.  Patients are seen on a first-come first-served basis, and  a limited number are seen during each clinic.   [facility]  [address] Hillard Danker [place], [place] [phone]   Eligibility Requirements You must have lived in [place], [place], or [place] counties for at least the last three months.   You cannot be eligible for state or federal sponsored [employer], including [employer], [place], or [employer].   You generally cannot be eligible for healthcare insurance through your employer.    How to apply: Eligibility screenings are held every Tuesday and Wednesday afternoon from 1:00 pm until 4:00 pm. You do not need an appointment for the interview!  [facility] [address], [place], [place]   [place]  [place] Department  [place]  [phone]    Behavioral Health Resources in the Community: Intensive Outpatient Programs Organization         Address  Phone  Notes  [place]. [address], [place], [place] [phone]   [facility] Outpatient [address] [place], [place]   ADS: Alcohol & Drug Svcs [address], [place], [place]   [place] [address],  [place], [place] or [phone]   Substance Abuse Resources Organization          Address  Phone  Notes  Alcohol and Drug Services  [phone]   [place]  [phone]   The [place]   Chinita Pester  [phone]   Residential & Outpatient Substance Abuse Program  [phone]   Psychological Services Organization         Address  Phone  Notes  [facility] Carmine  [place]  [phone]   [place] [address], [place] or [phone]    Mobile Crisis Teams Organization         Address  Phone  Notes  [facility]atives, Mobile Crisis Care Unit  [phone]   [facility]  [address] [place], [place]   [place] [address], [place] [phone]    Self-Help/Support Groups Organization         Address  Phone             Notes  [place]. of [place] - variety of support groups  [place] Call for more information  Narcotics Anonymous (NA), [facility]aring [facility] [address], [employer] [place]  2 meetings at this location   Special educational needs teacher         Address  Phone  Notes  ASAP Residential Treatment [place],    [place]  [phone]   [facility]  [address], [place] [address], [place], [place]   [place], [place] [phone] Admissions: 8am-3pm M-F  Incentives Substance [place] [address],    [place], [place] [phone]   [facility] [address] Jadene Pierini [place], [place]   [facility] [address]  [place], [place]   [facility] Programs - Intensive Outpatient [address], [place], [place]   [facility] ([place].,  [place], [place] or [phone]   [facility] Services (RTS) [address] [place], [place] Accepts Medicaid  [facility]ship  [address],  [place] [phone] Substance Abuse/Addiction Treatment   [facility] Organization         Address  Phone  Notes  [facility]  [phone]   Domenic Schwab, PhD [address] Arlis Porta [place], [place]   [phone] or [phone]   [place], [place] [phone]   [address], [place], [place] [phone] Insurance/Medicaid/sponsorship through [facility] and Families [address]                                    [place], [place] [phone] [place] [address], [place] [phone]    Dr. Adele Schilder  [phone]   [facility] Dept. 1) [address], [place] 2) [place] 3)  [place] [address], [place] [phone]  [phone]   [place] [phone] or [phone] (After Hours)

## 2014-08-01 NOTE — ED Notes (Signed)
Declined W/C at D/C and was escorted to lobby by RN. 

## 2014-08-01 NOTE — ED Notes (Signed)
Orotho tech at bed side.

## 2014-08-01 NOTE — ED Notes (Signed)
PT offered ice packs for hand but declined at this time.

## 2014-08-01 NOTE — ED Notes (Signed)
Pt reports biateral hand pain for several months. Pt reports working in [employer] .

## 2014-08-01 NOTE — Progress Notes (Signed)
Orthopedic Tech Progress Note Patient Details:  Terrance MassMichelle E [facility] 1966/05/07 [id number]  Ortho Devices Type of Ortho Device: Thumb velcro splint Ortho Device/Splint Location: (B) UE Ortho Device/Splint Interventions: Ordered;Application   Jennye MoccasinHughes, Gabrien Mentink Craig 08/01/2014, 5:10 PM

## 2014-08-02 NOTE — ED Provider Notes (Signed)
Medical screening examination/treatment/procedure(s) were performed by non-physician practitioner and as supervising physician I was immediately available for consultation/collaboration.   EKG Interpretation None        Audree CamelScott T Chelsa Stout, MD 08/02/14 1642

## 2015-02-26 ENCOUNTER — Emergency Department (HOSPITAL_COMMUNITY): Payer: No Typology Code available for payment source

## 2015-02-26 ENCOUNTER — Emergency Department (HOSPITAL_COMMUNITY)
Admission: EM | Admit: 2015-02-26 | Discharge: 2015-02-26 | Disposition: A | Discharge status: Home / Self Care | Payer: No Typology Code available for payment source | Attending: Emergency Medicine | Admitting: Emergency Medicine

## 2015-02-26 ENCOUNTER — Encounter (HOSPITAL_COMMUNITY): Payer: Self-pay | Admitting: Emergency Medicine

## 2015-02-26 DIAGNOSIS — Z79899 Other long term (current) drug therapy: Secondary | ICD-10-CM | POA: Insufficient documentation

## 2015-02-26 DIAGNOSIS — E669 Obesity, unspecified: Secondary | ICD-10-CM | POA: Diagnosis not present

## 2015-02-26 DIAGNOSIS — R0602 Shortness of breath: Secondary | ICD-10-CM | POA: Diagnosis not present

## 2015-02-26 DIAGNOSIS — K219 Gastro-esophageal reflux disease without esophagitis: Secondary | ICD-10-CM | POA: Insufficient documentation

## 2015-02-26 DIAGNOSIS — Z880 Allergy status to penicillin: Secondary | ICD-10-CM | POA: Diagnosis not present

## 2015-02-26 DIAGNOSIS — R079 Chest pain, unspecified: Secondary | ICD-10-CM | POA: Diagnosis present

## 2015-02-26 DIAGNOSIS — Z8739 Personal history of other diseases of the musculoskeletal system and connective tissue: Secondary | ICD-10-CM | POA: Diagnosis not present

## 2015-02-26 HISTORY — DX: Gastro-esophageal reflux disease without esophagitis: K21.9

## 2015-02-26 LAB — BASIC METABOLIC PANEL
Anion gap: 10 (ref 5–15)
BUN: 8 mg/dL (ref 6–20)
CALCIUM: 9 mg/dL (ref 8.9–10.3)
CO2: 23 mmol/L (ref 22–32)
Chloride: 105 mmol/L (ref 101–111)
Creatinine, Ser: 0.99 mg/dL (ref 0.44–1.00)
Glucose, Bld: 92 mg/dL (ref 70–99)
POTASSIUM: 3.7 mmol/L (ref 3.5–5.1)
SODIUM: 138 mmol/L (ref 135–145)

## 2015-02-26 LAB — I-STAT TROPONIN, ED: Troponin i, poc: 0 ng/mL (ref 0.00–0.08)

## 2015-02-26 LAB — CBC
HEMATOCRIT: 37.3 % (ref 36.0–46.0)
HEMOGLOBIN: 12.9 g/dL (ref 12.0–15.0)
MCH: 25.7 pg — AB (ref 26.0–34.0)
MCHC: 34.6 g/dL (ref 30.0–36.0)
MCV: 74.3 fL — AB (ref 78.0–100.0)
Platelets: 338 10*3/uL (ref 150–400)
RBC: 5.02 MIL/uL (ref 3.87–5.11)
RDW: 16.3 % — ABNORMAL HIGH (ref 11.5–15.5)
WBC: 6.6 10*3/uL (ref 4.0–10.5)

## 2015-02-26 LAB — POC URINE PREG, ED: Preg Test, Ur: NEGATIVE

## 2015-02-26 MED ORDER — GI COCKTAIL ~~LOC~~
30.0000 mL | Freq: Once | ORAL | Status: AC
  Administered 2015-02-26: 30 mL via ORAL
  Filled 2015-02-26: qty 30

## 2015-02-26 MED ORDER — ASPIRIN 325 MG PO TABS
325.0000 mg | ORAL_TABLET | ORAL | Status: AC
  Administered 2015-02-26: 325 mg via ORAL
  Filled 2015-02-26: qty 1

## 2015-02-26 MED ORDER — OMEPRAZOLE 20 MG PO CPDR: 20.0000 mg | DELAYED_RELEASE_CAPSULE | Freq: Every day | ORAL | Status: DC

## 2015-02-26 NOTE — ED Notes (Signed)
Patient coming from home with central chest pain that has been ongoing x 1 month.  Pain is 6/10.  Denies radiation to the left or right arm but does have radiation under the left breast.  Patient is tender to palpation of the chest.

## 2015-02-26 NOTE — Discharge Instructions (Signed)
Chest Pain (Nonspecific) °It is often hard to give a specific diagnosis for the cause of chest pain. There is always a chance that your pain could be related to something serious, such as a heart attack or a blood clot in the lungs. You need to follow up with your health care provider for further evaluation. °CAUSES  °· Heartburn. °· Pneumonia or bronchitis. °· Anxiety or stress. °· Inflammation around your heart (pericarditis) or lung (pleuritis or pleurisy). °· A blood clot in the lung. °· A collapsed lung (pneumothorax). It can develop suddenly on its own (spontaneous pneumothorax) or from trauma to the chest. °· Shingles infection (herpes zoster virus). °The chest wall is composed of bones, muscles, and cartilage. Any of these can be the source of the pain. °· The bones can be bruised by injury. °· The muscles or cartilage can be strained by coughing or overwork. °· The cartilage can be affected by inflammation and become sore (costochondritis). °DIAGNOSIS  °Lab tests or other studies may be needed to find the cause of your pain. Your health care provider may have you take a test called an ambulatory electrocardiogram (ECG). An ECG records your heartbeat patterns over a 24-hour period. You may also have other tests, such as: °· Transthoracic echocardiogram (TTE). During echocardiography, sound waves are used to evaluate how blood flows through your heart. °· Transesophageal echocardiogram (TEE). °· Cardiac monitoring. This allows your health care provider to monitor your heart rate and rhythm in real time. °· Holter monitor. This is a portable device that records your heartbeat and can help diagnose heart arrhythmias. It allows your health care provider to track your heart activity for several days, if needed. °· Stress tests by exercise or by giving medicine that makes the heart beat faster. °TREATMENT  °· Treatment depends on what may be causing your chest pain. Treatment may include: °· Acid blockers for  heartburn. °· Anti-inflammatory medicine. °· Pain medicine for inflammatory conditions. °· Antibiotics if an infection is present. °· You may be advised to change lifestyle habits. This includes stopping smoking and avoiding alcohol, caffeine, and chocolate. °· You may be advised to keep your head raised (elevated) when sleeping. This reduces the chance of acid going backward from your stomach into your esophagus. °Most of the time, nonspecific chest pain will improve within 2-3 days with rest and mild pain medicine.  °HOME CARE INSTRUCTIONS  °· If antibiotics were prescribed, take them as directed. Finish them even if you start to feel better. °· For the next few days, avoid physical activities that bring on chest pain. Continue physical activities as directed. °· Do not use any tobacco products, including cigarettes, chewing tobacco, or electronic cigarettes. °· Avoid drinking alcohol. °· Only take medicine as directed by your health care provider. °· Follow your health care provider's suggestions for further testing if your chest pain does not go away. °· Keep any follow-up appointments you made. If you do not go to an appointment, you could develop lasting (chronic) problems with pain. If there is any problem keeping an appointment, call to reschedule. °SEEK MEDICAL CARE IF:  °· Your chest pain does not go away, even after treatment. °· You have a rash with blisters on your chest. °· You have a fever. °SEEK IMMEDIATE MEDICAL CARE IF:  °· You have increased chest pain or pain that spreads to your arm, neck, jaw, back, or abdomen. °· You have shortness of breath. °· You have an increasing cough, or you cough   up blood. °· You have severe back or abdominal pain. °· You feel nauseous or vomit. °· You have severe weakness. °· You faint. °· You have chills. °This is an emergency. Do not wait to see if the pain will go away. Get medical help at once. Call your local emergency services (911 in [place] Do not drive  yourself to the hospital. °MAKE SURE YOU:  °· Understand these instructions. °· Will watch your condition. °· Will get help right away if you are not doing well or get worse. °Document Released: 07/21/2005 Document Revised: 10/16/2013 Document Reviewed: 05/16/2008 °Exit[employer]® Patient Information ©2015 [employer]. This information is not intended to replace advice given to you by your health care provider. Make sure you discuss any questions you have with your health care provider. ° °Gastroesophageal Reflux Disease, Adult °Gastroesophageal reflux disease (GERD) happens when acid from your stomach flows up into the esophagus. When acid comes in contact with the esophagus, the acid causes soreness (inflammation) in the esophagus. Over time, GERD may create small holes (ulcers) in the lining of the esophagus. °CAUSES  °· Increased body weight. This puts pressure on the stomach, making acid rise from the stomach into the esophagus. °· Smoking. This increases acid production in the stomach. °· Drinking alcohol. This causes decreased pressure in the lower esophageal sphincter (valve or ring of muscle between the esophagus and stomach), allowing acid from the stomach into the esophagus. °· Late evening meals and a full stomach. This increases pressure and acid production in the stomach. °· A malformed lower esophageal sphincter. °Sometimes, no cause is found. °SYMPTOMS  °· Burning pain in the lower part of the mid-chest behind the breastbone and in the mid-stomach area. This may occur twice a week or more often. °· Trouble swallowing. °· Sore throat. °· Dry cough. °· Asthma-like symptoms including chest tightness, shortness of breath, or wheezing. °DIAGNOSIS  °Your caregiver may be able to diagnose GERD based on your symptoms. In some cases, X-rays and other tests may be done to check for complications or to check the condition of your stomach and esophagus. °TREATMENT  °Your caregiver may recommend over-the-counter or  prescription medicines to help decrease acid production. Ask your caregiver before starting or adding any new medicines.  °HOME CARE INSTRUCTIONS  °· Change the factors that you can control. Ask your caregiver for guidance concerning weight loss, quitting smoking, and alcohol consumption. °· Avoid foods and drinks that make your symptoms worse, such as: °¨ Caffeine or alcoholic drinks. °¨ Chocolate. °¨ Peppermint or mint flavorings. °¨ Garlic and onions. °¨ Spicy foods. °¨ Citrus fruits, such as oranges, lemons, or limes. °¨ Tomato-based foods such as sauce, chili, salsa, and pizza. °¨ Fried and fatty foods. °· Avoid lying down for the 3 hours prior to your bedtime or prior to taking a nap. °· Eat small, frequent meals instead of large meals. °· Wear loose-fitting clothing. Do not wear anything tight around your waist that causes pressure on your stomach. °· Raise the head of your bed 6 to 8 inches with wood blocks to help you sleep. Extra pillows will not help. °· Only take over-the-counter or prescription medicines for pain, discomfort, or fever as directed by your caregiver. °· Do not take aspirin, ibuprofen, or other nonsteroidal anti-inflammatory drugs (NSAIDs). °SEEK IMMEDIATE MEDICAL CARE IF:  °· You have pain in your arms, neck, jaw, teeth, or back. °· Your pain increases or changes in intensity or duration. °· You develop nausea, vomiting, or sweating (  diaphoresis). °· You develop shortness of breath, or you faint. °· Your vomit is green, yellow, black, or looks like coffee grounds or blood. °· Your stool is red, bloody, or black. °These symptoms could be signs of other problems, such as heart disease, gastric bleeding, or esophageal bleeding. °MAKE SURE YOU:  °· Understand these instructions. °· Will watch your condition. °· Will get help right away if you are not doing well or get worse. °Document Released: 07/21/2005 Document Revised: 01/03/2012 Document Reviewed: 04/30/2011 °ExitCare® Patient  Information ©2015 [employer]. This information is not intended to replace advice given to you by your health care provider. Make sure you discuss any questions you have with your health care provider. ° °Food Choices for Gastroesophageal Reflux Disease °When you have gastroesophageal reflux disease (GERD), the foods you eat and your eating habits are very important. Choosing the right foods can help ease the discomfort of GERD. °WHAT GENERAL GUIDELINES DO I NEED TO FOLLOW? °· Choose fruits, vegetables, whole grains, low-fat dairy products, and low-fat meat, fish, and poultry. °· Limit fats such as oils, salad dressings, butter, nuts, and avocado. °· Keep a food diary to identify foods that cause symptoms. °· Avoid foods that cause reflux. These may be different for different people. °· Eat frequent small meals instead of three large meals each day. °· Eat your meals slowly, in a relaxed setting. °· Limit fried foods. °· Cook foods using methods other than frying. °· Avoid drinking alcohol. °· Avoid drinking large amounts of liquids with your meals. °· Avoid bending over or lying down until 2-3 hours after eating. °WHAT FOODS ARE NOT RECOMMENDED? °The following are some foods and drinks that may worsen your symptoms: °Vegetables °Tomatoes. Tomato juice. Tomato and spaghetti sauce. Chili peppers. Onion and garlic. Horseradish. °Fruits °Oranges, grapefruit, and lemon (fruit and juice). °Meats °High-fat meats, fish, and poultry. This includes hot dogs, ribs, ham, sausage, salami, and bacon. °Dairy °Whole milk and chocolate milk. Sour cream. Cream. Butter. Ice cream. Cream cheese.  °Beverages °Coffee and tea, with or without caffeine. Carbonated beverages or energy drinks. °Condiments °Hot sauce. Barbecue sauce.  °Sweets/Desserts °Chocolate and cocoa. Donuts. Peppermint and spearmint. °Fats and Oils °High-fat foods, including French fries and potato chips. °Other °Vinegar. Strong spices, such as black pepper, white  pepper, red pepper, cayenne, curry powder, cloves, ginger, and chili powder. °The items listed above may not be a complete list of foods and beverages to avoid. Contact your dietitian for more information. °Document Released: 10/11/2005 Document Revised: 10/16/2013 Document Reviewed: 08/15/2013 °ExitCare® Patient Information ©2015 [employer]. This information is not intended to replace advice given to you by your health care provider. Make sure you discuss any questions you have with your health care provider. ° °

## 2015-02-26 NOTE — ED Provider Notes (Signed)
CSN: [id number]     Arrival date & time 02/26/15  1016 History   First MD Initiated Contact with Patient 02/26/15 1107     Chief Complaint  Patient presents with  . Chest Pain   Amy Perez is a 49 y.o. female with a history of acid reflux who presents to the ED complaining of substernal chest pain constant for the past month. She claims of substernal chest pain that she describes as dull and rates it a 6 out of 10 currently. She reports it occasionally can radiate to the left side of her chest. She reports her pain fluctuates in intensity but never goes away completely. She reports her pain is worse with eating certain foods such as cheese and better after taking Zantac or gas pills. Patient also reports some shortness of breath with exertion such as walking up stairs at his worsened in the past 2 months. She denies shortness of breath at rest or current shortness of breath. She denies personal history of MI. She reports a father with MI at age 48 and a mother with an MI at age 48. The patient denies personal or family history of PEs or DVTs. The patient denies personal or family history of blood clotting disorders such as factor V Leiden, protein C or protein S deficiency. The patient is not a smoker and is not on any endogenous estrogens. She denies recent long travel or recent surgeries. The patient denies fevers, cough, abdominal pain, nausea, vomiting, diarrhea, palpitations, leg pain, leg swelling or rashes.   (Consider location/radiation/quality/duration/timing/severity/associated sxs/prior Treatment) HPI  Past Medical History  Diagnosis Date  . AC (acromioclavicular) joint bone spurs   . Tendonitis   . Acid reflux    Past Surgical History  Procedure Laterality Date  . Tubal ligation  08/29/04  . Cholecystectomy     No family history on file. History  Substance Use Topics  . Smoking status: Never Smoker   . Smokeless tobacco: Not on file  . Alcohol Use: No   OB History    No data available     Review of Systems  Constitutional: Negative for fever and chills.  HENT: Negative for congestion, ear pain and sore throat.   Eyes: Negative for pain and visual disturbance.  Respiratory: Positive for shortness of breath. Negative for cough and wheezing.   Cardiovascular: Positive for chest pain. Negative for palpitations and leg swelling.  Gastrointestinal: Negative for nausea, vomiting, abdominal pain and diarrhea.  Genitourinary: Negative for dysuria, hematuria and difficulty urinating.  Musculoskeletal: Negative for back pain and neck pain.  Skin: Negative for rash.  Neurological: Negative for weakness, light-headedness, numbness and headaches.      Allergies  Amoxicillin; Benadryl; Oxycodone-acetaminophen; and Penicillins  Home Medications   Prior to Admission medications   Medication Sig Start Date End Date Taking? Authorizing Provider  naproxen sodium (ANAPROX) 220 MG tablet Take 440 mg by mouth daily as needed (foot pain).    Historical Provider, MD  omeprazole (PRILOSEC) 20 MG capsule Take 1 capsule (20 mg total) by mouth daily. 02/26/15   Everlene FarrierWilliam Mulki Roesler, PA-C   BP 125/72 mmHg  Pulse 85  Temp(Src) 98.1 F (36.7 C) (Oral)  Resp 17  Ht 5\' 6"  (1.676 m)  Wt 260 lb (117.935 kg)  BMI 41.99 kg/m2  SpO2 99%  LMP 02/12/2015 Physical Exam  Constitutional: She is oriented to person, place, and time. She appears well-developed and well-nourished. No distress.  Obese female. Nontoxic appearing.  HENT:  Head:  Normocephalic and atraumatic.  Right Ear: External ear normal.  Left Ear: External ear normal.  Mouth/Throat: Oropharynx is clear and moist. No oropharyngeal exudate.  Eyes: Conjunctivae are normal. Pupils are equal, round, and reactive to light. Right eye exhibits no discharge. Left eye exhibits no discharge.  Neck: Neck supple. No JVD present. No tracheal deviation present.  Cardiovascular: Normal rate, regular rhythm, normal heart sounds and  intact distal pulses.  Exam reveals no gallop and no friction rub.   No murmur heard. Bilateral radial, posterior tibialis and dorsalis pedis pulses are intact.   Pulmonary/Chest: Effort normal and breath sounds normal. No respiratory distress. She has no wheezes. She has no rales. She exhibits tenderness.  Lungs are clear to auscultation bilaterally. Substernal chest tenderness which reproduces her chest pain.  Abdominal: Soft. She exhibits no distension. There is no tenderness. There is no guarding.  Musculoskeletal: She exhibits no edema or tenderness.  No lower extremity edema or tenderness.  Lymphadenopathy:    She has no cervical adenopathy.  Neurological: She is alert and oriented to person, place, and time. Coordination normal.  Skin: Skin is warm and dry. No rash noted. She is not diaphoretic. No erythema. No pallor.  Psychiatric: She has a normal mood and affect. Her behavior is normal.  Nursing note and vitals reviewed.   ED Course  Procedures (including critical care time) Labs Review Labs Reviewed  CBC - Abnormal; Notable for the following:    MCV 74.3 (*)    MCH 25.7 (*)    RDW 16.3 (*)    All other components within normal limits  BASIC METABOLIC PANEL  I-STAT TROPOININ, ED  POC URINE PREG, ED    Imaging Review Dg Chest Port 1 View  02/26/2015   CLINICAL DATA:  Chest pain and short of breath  EXAM: PORTABLE CHEST - 1 VIEW  COMPARISON:  09/07/2010  FINDINGS: Mild cardiac enlargement without heart failure. Mild bibasilar atelectasis. Negative for pleural effusion. Negative for mass lesion.  IMPRESSION: Hypoventilation with mild bibasilar atelectasis.   Electronically Signed   By: Marlan Palau M.D.   On: 02/26/2015 11:19     EKG Interpretation   Date/Time:  Wednesday Feb 26 2015 10:20:22 EDT Ventricular Rate:  86 PR Interval:  170 QRS Duration: 80 QT Interval:  378 QTC Calculation: 452 R Axis:   26 Text Interpretation:  Normal sinus rhythm Abnormal ECG No  significant  change since last tracing Confirmed by NANAVATI, MD, ANKIT ([id number]) on  02/26/2015 11:38:57 AM      Filed Vitals:   02/26/15 1030 02/26/15 1200 02/26/15 1230 02/26/15 1300  BP: 148/80 140/67 145/62 125/72  Pulse: 86 84 80 85  Temp:      TempSrc:      Resp: 17     Height:      Weight:      SpO2: 100% 99% 97% 99%     MDM   Meds given in ED:  Medications  aspirin tablet 325 mg (325 mg Oral Given 02/26/15 1106)  gi cocktail (Maalox,Lidocaine,Donnatal) (30 mLs Oral Given 02/26/15 1221)    Discharge Medication List as of 02/26/2015  1:16 PM    START taking these medications   Details  omeprazole (PRILOSEC) 20 MG capsule Take 1 capsule (20 mg total) by mouth daily., Starting 02/26/2015, Until Discontinued, Print        Final diagnoses:  Chest pain, unspecified chest pain type  Gastroesophageal reflux disease, esophagitis presence not specified    Patient with  substernal chest pain ongoing for the past month. She reports her pain is constant and is worse with certain foods and improves with Zantac. Patient is to be discharged with recommendation to follow up with PCP in regards to today's hospital visit. Chest pain is not likely of cardiac or pulmonary etiology d/t presentation, perc negative, VSS, no tracheal deviation, no JVD or new murmur, RRR, breath sounds equal bilaterally, EKG without acute abnormalities, negative troponin, and negative CXR. Pt has been advised start a PPI and return to the ED if CP becomes exertional, associated with diaphoresis or nausea, radiates to left jaw/arm, worsens or becomes concerning in any way. Pt appears reliable for follow up and is agreeable to discharge. I advised the patient to follow-up with their primary care provider this week. I advised the patient to return to the emergency department with new or worsening symptoms or new concerns. The patient verbalized understanding and agreement with plan.   This patient was discussed with Dr.  Rhunette CroftNanavati who agrees with assessment and plan.     Everlene FarrierWilliam Oree Hislop, PA-C 02/26/15 1752  Derwood KaplanAnkit Nanavati, MD 02/27/15 [phone]

## 2015-03-04 ENCOUNTER — Ambulatory Visit: Payer: No Typology Code available for payment source

## 2016-02-27 ENCOUNTER — Emergency Department (HOSPITAL_COMMUNITY)
Admission: EM | Admit: 2016-02-27 | Discharge: 2016-02-27 | Disposition: A | Discharge status: Home / Self Care | Payer: BLUE CROSS/BLUE SHIELD | Attending: Emergency Medicine | Admitting: Emergency Medicine

## 2016-02-27 ENCOUNTER — Encounter (HOSPITAL_COMMUNITY): Payer: Self-pay | Admitting: Emergency Medicine

## 2016-02-27 DIAGNOSIS — Z3202 Encounter for pregnancy test, result negative: Secondary | ICD-10-CM | POA: Insufficient documentation

## 2016-02-27 DIAGNOSIS — I10 Essential (primary) hypertension: Secondary | ICD-10-CM

## 2016-02-27 DIAGNOSIS — K219 Gastro-esophageal reflux disease without esophagitis: Secondary | ICD-10-CM | POA: Insufficient documentation

## 2016-02-27 DIAGNOSIS — Z79899 Other long term (current) drug therapy: Secondary | ICD-10-CM | POA: Insufficient documentation

## 2016-02-27 DIAGNOSIS — Z880 Allergy status to penicillin: Secondary | ICD-10-CM | POA: Diagnosis not present

## 2016-02-27 DIAGNOSIS — Z8739 Personal history of other diseases of the musculoskeletal system and connective tissue: Secondary | ICD-10-CM | POA: Insufficient documentation

## 2016-02-27 DIAGNOSIS — R42 Dizziness and giddiness: Secondary | ICD-10-CM | POA: Diagnosis present

## 2016-02-27 LAB — BASIC METABOLIC PANEL
Anion gap: 7 (ref 5–15)
BUN: 7 mg/dL (ref 6–20)
CALCIUM: 9 mg/dL (ref 8.9–10.3)
CO2: 26 mmol/L (ref 22–32)
Chloride: 106 mmol/L (ref 101–111)
Creatinine, Ser: 0.83 mg/dL (ref 0.44–1.00)
GLUCOSE: 102 mg/dL — AB (ref 65–99)
Potassium: 3.1 mmol/L — ABNORMAL LOW (ref 3.5–5.1)
Sodium: 139 mmol/L (ref 135–145)

## 2016-02-27 LAB — URINALYSIS, ROUTINE W REFLEX MICROSCOPIC
Bilirubin Urine: NEGATIVE
GLUCOSE, UA: NEGATIVE mg/dL
Ketones, ur: NEGATIVE mg/dL
Nitrite: NEGATIVE
PH: 7.5 (ref 5.0–8.0)
Protein, ur: NEGATIVE mg/dL
Specific Gravity, Urine: 1.013 (ref 1.005–1.030)

## 2016-02-27 LAB — CBC
HEMATOCRIT: 38.3 % (ref 36.0–46.0)
Hemoglobin: 12.8 g/dL (ref 12.0–15.0)
MCH: 25.8 pg — ABNORMAL LOW (ref 26.0–34.0)
MCHC: 33.4 g/dL (ref 30.0–36.0)
MCV: 77.1 fL — AB (ref 78.0–100.0)
Platelets: 322 10*3/uL (ref 150–400)
RBC: 4.97 MIL/uL (ref 3.87–5.11)
RDW: 16.8 % — AB (ref 11.5–15.5)
WBC: 7.8 10*3/uL (ref 4.0–10.5)

## 2016-02-27 LAB — URINE MICROSCOPIC-ADD ON

## 2016-02-27 LAB — PREGNANCY, URINE: Preg Test, Ur: NEGATIVE

## 2016-02-27 LAB — TROPONIN I: Troponin I: <0.03 ng/mL (ref <0.031)

## 2016-02-27 MED ORDER — AMLODIPINE BESYLATE 5 MG PO TABS: 5.0000 mg | ORAL_TABLET | Freq: Every day | ORAL | Status: DC

## 2016-02-27 MED ORDER — AMLODIPINE BESYLATE 5 MG PO TABS
5.0000 mg | ORAL_TABLET | Freq: Once | ORAL | Status: AC
  Administered 2016-02-27: 5 mg via ORAL
  Filled 2016-02-27: qty 1

## 2016-02-27 MED ORDER — GI COCKTAIL ~~LOC~~
30.0000 mL | Freq: Once | ORAL | Status: AC
  Administered 2016-02-27: 30 mL via ORAL
  Filled 2016-02-27: qty 30

## 2016-02-27 MED ORDER — POTASSIUM CHLORIDE CRYS ER 20 MEQ PO TBCR
20.0000 meq | EXTENDED_RELEASE_TABLET | Freq: Once | ORAL | Status: AC
  Administered 2016-02-27: 20 meq via ORAL
  Filled 2016-02-27: qty 1

## 2016-02-27 NOTE — ED Notes (Signed)
Pt arrives from home with fatigue for the last several days not feeling well. Per EMS feeling dizzy with position changes. Per pt feeling like her bp was high. No PCP. 20g LAC, [id number].

## 2016-02-27 NOTE — Discharge Instructions (Signed)
Please read and follow all provided instructions.  Your diagnoses today include:  1. Essential hypertension    Tests performed today include:  Vital signs. See below for your results today.   Medications prescribed:   Take as prescribed   Home care instructions:  Follow any educational materials contained in this packet.  Follow-up instructions: Please follow-up a primary care provider with the number provided for further evaluation of symptoms and treatment   Return instructions:   Please return to the Emergency Department if you do not get better, if you get worse, or new symptoms OR  - Fever (temperature greater than 101.38F)  - Bleeding that does not stop with holding pressure to the area    -Severe pain (please note that you may be more sore the day after your accident)  - Chest Pain  - Difficulty breathing  - Severe nausea or vomiting  - Inability to tolerate food and liquids  - Passing out  - Skin becoming red around your wounds  - Change in mental status (confusion or lethargy)  - New numbness or weakness     Please return if you have any other emergent concerns.  Additional Information:  Your vital signs today were: BP 175/86 mmHg   Pulse 90   Temp(Src) 98.3 F (36.8 C) (Oral)   Resp 20   SpO2 98%   LMP 02/27/2016 If your blood pressure (BP) was elevated above 135/85 this visit, please have this repeated by your doctor within one month. ---------------

## 2016-02-27 NOTE — ED Notes (Signed)
E-signature not available, patient denies any concerns with discharge 

## 2016-02-27 NOTE — ED Provider Notes (Signed)
CSN: [id number]     Arrival date & time 02/27/16  1342 History   First MD Initiated Contact with Patient 02/27/16 1347     Chief Complaint  Patient presents with  . Dizziness  . Fatigue  . Hypertension   (Consider location/radiation/quality/duration/timing/severity/associated sxs/prior Treatment) HPI  50 y.o. female presents to the Emergency Department today complaining of fatigue for the past several weeks since she has been taking care of her elderly mother. States increase in stress as well as a decrease in sleep as well. Called EMS for her mother today and noticed dizziness upon standing up. EMS took BP and showed that it was elevated. Pt does not have a PCP or take blood pressure medication. No hx HTN. Does not endorse any pain. No CP/SOB/ABD pain at this time. No headaches. No vision changes/loss. No N/V/D. No fevers. Pt does report dizziness with positional changes. Room does not tilt on its axis. No abnormal gait. Notes decrease in PO intake since she is busy. No other symptoms noted.  Past Medical History  Diagnosis Date  . AC (acromioclavicular) joint bone spurs   . Tendonitis   . Acid reflux    Past Surgical History  Procedure Laterality Date  . Tubal ligation  08/29/04  . Cholecystectomy     History reviewed. No pertinent family history. Social History  Substance Use Topics  . Smoking status: Never Smoker   . Smokeless tobacco: None  . Alcohol Use: No   OB History    No data available     Review of Systems ROS reviewed and all are negative for acute change except as noted in the HPI.  Allergies  Amoxicillin; Benadryl; Oxycodone-acetaminophen; and Penicillins  Home Medications   Prior to Admission medications   Medication Sig Start Date End Date Taking? Authorizing Provider  naproxen sodium (ANAPROX) 220 MG tablet Take 440 mg by mouth daily as needed (foot pain).    Historical Provider, MD  omeprazole (PRILOSEC) 20 MG capsule Take 1 capsule (20 mg total) by  mouth daily. 02/26/15   Everlene FarrierWilliam Dansie, PA-C   BP 175/86 mmHg  Pulse 90  Temp(Src) 98.3 F (36.8 C) (Oral)  Resp 20  SpO2 98%  LMP 02/27/2016   Physical Exam  Constitutional: She is oriented to person, place, and time. She appears well-developed and well-nourished.  HENT:  Head: Normocephalic and atraumatic.  Eyes: EOM are normal. Pupils are equal, round, and reactive to light.  Neck: Normal range of motion. Neck supple. No tracheal deviation present.  Cardiovascular: Normal rate, regular rhythm, normal heart sounds and intact distal pulses.   No murmur heard. Pulmonary/Chest: Effort normal and breath sounds normal. No respiratory distress. She has no wheezes. She has no rales. She exhibits no tenderness.  Abdominal: Soft. Bowel sounds are normal. There is no tenderness.  Musculoskeletal: Normal range of motion.  Neurological: She is alert and oriented to person, place, and time. She has normal strength. No cranial nerve deficit or sensory deficit.  Cranial Nerves:  II: Pupils equal, round, reactive to light III,IV, VI: ptosis not present, extra-ocular motions intact bilaterally  V,VII: smile symmetric, facial light touch sensation equal VIII: hearing grossly normal bilaterally  IX,X: midline uvula rise  XI: bilateral shoulder shrug equal and strong XII: midline tongue extension  Skin: Skin is warm and dry.  Psychiatric: She has a normal mood and affect. Her behavior is normal. Thought content normal.  Nursing note and vitals reviewed.  ED Course  Procedures (including critical care time)  Labs Review Labs Reviewed  CBC - Abnormal; Notable for the following:    MCV 77.1 (*)    MCH 25.8 (*)    RDW 16.8 (*)    All other components within normal limits  BASIC METABOLIC PANEL - Abnormal; Notable for the following:    Potassium 3.1 (*)    Glucose, Bld 102 (*)    All other components within normal limits  URINALYSIS, ROUTINE W REFLEX MICROSCOPIC (NOT AT [facility]) - Abnormal;  Notable for the following:    APPearance CLOUDY (*)    Hgb urine dipstick LARGE (*)    Leukocytes, UA TRACE (*)    All other components within normal limits  URINE MICROSCOPIC-ADD ON - Abnormal; Notable for the following:    Squamous Epithelial / LPF 6-30 (*)    Bacteria, UA MANY (*)    All other components within normal limits  PREGNANCY, URINE  TROPONIN I   Imaging Review No results found. I have personally reviewed and evaluated these images and lab results as part of my medical decision-making.   EKG Interpretation   Date/Time:  Friday Feb 27 2016 13:53:42 EDT Ventricular Rate:  88 PR Interval:  178 QRS Duration: 84 QT Interval:  384 QTC Calculation: 464 R Axis:   38 Text Interpretation:  Normal sinus rhythm Normal ECG No significant change  was found Confirmed by Manus Gunning  MD, STEPHEN [phone]) on 02/27/2016 1:58:17 PM      MDM  I have reviewed and evaluated the relevant laboratory values I have interpreted the relevant EKG. I have reviewed the relevant previous healthcare records. I obtained HPI from historian. Patient discussed with supervising physician  ED Course:  Assessment: Pt is a 50yF who presents with fatigue x several weeks. Notes increase stress with taking care of mother who is sick and decrease in sleep. Noted high BP at work today and told to come to ED. No PCP. No Dx HTN or medication. On exam, pt in NAD. Nontoxic/nonseptic appearing. VSS. Afebrile. Lungs CTA. Heart RRR. Abdomen nontender soft. Orthostatics unremarkable. Blood pressure did show 193/111 standing. No vision changes, headache. Labs unremarkable. Trop neg. EKG NSR. Given  Amlodipine in ED. Plan is to DC Home with Rx Amlodipine and follow up with PCP with number provided. At time of discharge, Patient is in no acute distress. Vital Signs are stable. Patient is able to ambulate. Patient able to tolerate PO.    Disposition/Plan:  DC home Additional Verbal discharge instructions given and discussed  with patient.  Pt Instructed to f/u with PCP in the next week for evaluation and treatment of symptoms. Return precautions given Pt acknowledges and agrees with plan  Supervising Physician Glynn Octave, MD   Final diagnoses:  Essential hypertension       Audry Pili, PA-C 02/27/16 2000  Glynn Octave, MD 02/28/16 [phone]

## 2016-04-08 ENCOUNTER — Ambulatory Visit (HOSPITAL_COMMUNITY)
Admission: EM | Admit: 2016-04-08 | Discharge: 2016-04-08 | Disposition: A | Discharge status: Nursing Home (Includes ICF) | Payer: BLUE CROSS/BLUE SHIELD | Attending: Emergency Medicine | Admitting: Emergency Medicine

## 2016-04-08 ENCOUNTER — Emergency Department (HOSPITAL_COMMUNITY): Payer: BLUE CROSS/BLUE SHIELD

## 2016-04-08 ENCOUNTER — Encounter (HOSPITAL_COMMUNITY): Payer: Self-pay | Admitting: Emergency Medicine

## 2016-04-08 ENCOUNTER — Emergency Department (HOSPITAL_COMMUNITY)
Admission: EM | Admit: 2016-04-08 | Discharge: 2016-04-09 | Disposition: A | Discharge status: Home / Self Care | Payer: BLUE CROSS/BLUE SHIELD | Attending: Emergency Medicine | Admitting: Emergency Medicine

## 2016-04-08 ENCOUNTER — Other Ambulatory Visit: Payer: Self-pay

## 2016-04-08 DIAGNOSIS — R072 Precordial pain: Secondary | ICD-10-CM | POA: Insufficient documentation

## 2016-04-08 DIAGNOSIS — R0602 Shortness of breath: Secondary | ICD-10-CM

## 2016-04-08 DIAGNOSIS — R0600 Dyspnea, unspecified: Secondary | ICD-10-CM

## 2016-04-08 DIAGNOSIS — Z79899 Other long term (current) drug therapy: Secondary | ICD-10-CM | POA: Diagnosis not present

## 2016-04-08 DIAGNOSIS — R079 Chest pain, unspecified: Secondary | ICD-10-CM

## 2016-04-08 DIAGNOSIS — I10 Essential (primary) hypertension: Secondary | ICD-10-CM | POA: Diagnosis not present

## 2016-04-08 HISTORY — DX: Essential (primary) hypertension: I10

## 2016-04-08 HISTORY — DX: Panic disorder (episodic paroxysmal anxiety): F41.0

## 2016-04-08 LAB — BASIC METABOLIC PANEL
ANION GAP: 7 (ref 5–15)
BUN: 7 mg/dL (ref 6–20)
CALCIUM: 9.2 mg/dL (ref 8.9–10.3)
CO2: 24 mmol/L (ref 22–32)
Chloride: 107 mmol/L (ref 101–111)
Creatinine, Ser: 0.88 mg/dL (ref 0.44–1.00)
GFR calc non Af Amer: >60 mL/min (ref >60)
GLUCOSE: 91 mg/dL (ref 65–99)
Potassium: 3.5 mmol/L (ref 3.5–5.1)
Sodium: 138 mmol/L (ref 135–145)

## 2016-04-08 LAB — CBC
HEMATOCRIT: 37.9 % (ref 36.0–46.0)
HEMOGLOBIN: 12.6 g/dL (ref 12.0–15.0)
MCH: 25.1 pg — AB (ref 26.0–34.0)
MCHC: 33.2 g/dL (ref 30.0–36.0)
MCV: 75.6 fL — ABNORMAL LOW (ref 78.0–100.0)
Platelets: 337 10*3/uL (ref 150–400)
RBC: 5.01 MIL/uL (ref 3.87–5.11)
RDW: 16.5 % — ABNORMAL HIGH (ref 11.5–15.5)
WBC: 6.9 10*3/uL (ref 4.0–10.5)

## 2016-04-08 LAB — I-STAT TROPONIN, ED
TROPONIN I, POC: 0.03 ng/mL (ref 0.00–0.08)
TROPONIN I, POC: 0.01 ng/mL (ref 0.00–0.08)

## 2016-04-08 MED ORDER — GI COCKTAIL ~~LOC~~
30.0000 mL | Freq: Once | ORAL | Status: AC
  Administered 2016-04-08: 30 mL via ORAL
  Filled 2016-04-08: qty 30

## 2016-04-08 NOTE — ED Notes (Signed)
Patient states intermittent chest pain and shortness of breath x 4 weeks.  Patient states she has been seen here for the same 3 x weeks ago.   Patient states "i want an xray or something this time".   Patient went to urgent care today and they sent her here.

## 2016-04-08 NOTE — ED Notes (Signed)
Patient's ekg from today and prior visit given to dr Chaney Mallingmortenson

## 2016-04-08 NOTE — ED Provider Notes (Addendum)
HPI  SUBJECTIVE:  Amy Perez is a 50 y.o. female who presents with dull, stabbing, substernal chest pain is been going on for the past 3-4 weeks. She states that it is intermittent, lasting hours. She states it is radiating to her right arm, and that it is different than her previous chest pain for which she received a ED workup on 02/26/2015. She reports new shortness of breath, dyspnea on exertion and decreased exercise tolerance. Symptoms are worse with lying down, shortness of breath is worse with walking. She states that the chest pain has been treated successfully with Zantac and omeprazole in the past. She is currently not on any omeprazole. She denies nausea, vomiting, diaphoresis, radiation to her neck, through to her back, belching, water brash, coughing, wheezing, fevers. No unintentional weight gain, lower extremity edema, nocturia, orthopnea, PND. No calf pain, swelling, hemoptysis, surgery in the past 4 weeks, exogenous estrogen, prolonged immobilization, abdominal pain. Past medical history of GERD, hypertension, panic attacks. states that she is compliant with her HTN medications. No history of PE, DVT, MI, coronary disease, diabetes, smoking, arrhythmia, atrial fibrillation, CHF. Family history significant for mother with MI starting in her 3070s, father with MIs in his 850s. No immediate family history of PE or DVT. PMD: None.   Past Medical History  Diagnosis Date  . AC (acromioclavicular) joint bone spurs   . Tendonitis   . Acid reflux   . Hypertension   . Panic attacks     Past Surgical History  Procedure Laterality Date  . Tubal ligation  08/29/04  . Cholecystectomy      Family History  Problem Relation Age of Onset  . Hypertension Mother   . Hypertension Father   . Heart attack Father     Social History  Substance Use Topics  . Smoking status: Never Smoker   . Smokeless tobacco: None  . Alcohol Use: No    No current facility-administered medications for  this encounter.  Current outpatient prescriptions:  .  amLODipine (NORVASC) 5 MG tablet, Take 1 tablet (5 mg total) by mouth daily., Disp: 30 tablet, Rfl: 0 .  omeprazole (PRILOSEC) 20 MG capsule, Take 1 capsule (20 mg total) by mouth daily., Disp: 30 capsule, Rfl: 1 .  naproxen sodium (ANAPROX) 220 MG tablet, Take 440 mg by mouth daily as needed (foot pain)., Disp: , Rfl:   Allergies  Allergen Reactions  . Amoxicillin Nausea Only  . Benadryl [Diphenhydramine Hcl] Itching  . Oxycodone-Acetaminophen Nausea And Vomiting  . Penicillins Nausea Only     ROS  As noted in HPI.   Physical Exam  BP 144/96 mmHg  Pulse 88  Temp(Src) 98.9 F (37.2 C)  Resp 24  SpO2 100%  LMP 03/10/2016  Constitutional: Well developed, well nourished, no acute distress. Morbidly obese Eyes: PERRL, EOMI, conjunctiva normal bilaterally HENT: Normocephalic, atraumatic,mucus membranes moist Neck: No JVD Respiratory: Clear to auscultation bilaterally, no rales, no wheezing, no rhonchi Cardiovascular: Normal rate and rhythm, no murmurs, no gallops, no rubs. Upper and lower distal pulses equal bilaterally GI: Soft, nondistended, normal bowel sounds, nontender, no rebound, no guarding skin: No rash, skin intact Musculoskeletal: Calves symmetric, No edema, no tenderness, no deformities Neurologic: Alert & oriented x 3, CN II-XII grossly intact, no motor deficits, sensation grossly intact Psychiatric: Speech and behavior appropriate   ED Course   Medications - No data to display  Orders Placed This Encounter  Procedures  . ED EKG    Standing Status: Standing  Number of Occurrences: 1     Standing Expiration Date:     Order Specific Question:  Reason for Exam    Answer:  Chest Pain  . EKG 12-Lead    Standing Status: Standing     Number of Occurrences: 1     Standing Expiration Date:    Results for orders placed or performed during the hospital encounter of 04/08/16 (from the past 24 hour(s))   Basic metabolic panel     Status: None   Collection Time: 04/08/16  2:55 PM  Result Value Ref Range   Sodium 138 135 - 145 mmol/L   Potassium 3.5 3.5 - 5.1 mmol/L   Chloride 107 101 - 111 mmol/L   CO2 24 22 - 32 mmol/L   Glucose, Bld 91 65 - 99 mg/dL   BUN 7 6 - 20 mg/dL   Creatinine, Ser 6.04 0.44 - 1.00 mg/dL   Calcium 9.2 8.9 - 54.0 mg/dL   GFR calc non Af Amer >60 >60 mL/min   GFR calc Af Amer >60 >60 mL/min   Anion gap 7 5 - 15  CBC     Status: Abnormal   Collection Time: 04/08/16  2:55 PM  Result Value Ref Range   WBC 6.9 4.0 - 10.5 K/uL   RBC 5.01 3.87 - 5.11 MIL/uL   Hemoglobin 12.6 12.0 - 15.0 g/dL   HCT 98.1 19.1 - 47.8 %   MCV 75.6 (L) 78.0 - 100.0 fL   MCH 25.1 (L) 26.0 - 34.0 pg   MCHC 33.2 30.0 - 36.0 g/dL   RDW 29.5 (H) 62.1 - 30.8 %   Platelets 337 150 - 400 K/uL  I-stat troponin, ED     Status: None   Collection Time: 04/08/16  3:11 PM  Result Value Ref Range   Troponin i, poc 0.03 0.00 - 0.08 ng/mL   Comment 3           Dg Chest 2 View  04/08/2016  CLINICAL DATA:  Chest pain, shortness of breath and hypertension for 4-5 weeks. EXAM: CHEST  2 VIEW COMPARISON:  Chest x-ray dated 02/26/2015. FINDINGS: Heart size is upper normal. Overall cardiomediastinal silhouette is stable in size and configuration. Lungs are clear. No evidence of pneumonia. No pleural effusion or pneumothorax seen. Mild degenerative spurring noted within the thoracic spine. No acute appearing osseous abnormality. IMPRESSION: No active cardiopulmonary disease.  No evidence of pneumonia. Electronically Signed   By: Bary Richard M.D.   On: 04/08/2016 15:31    ED Clinical Impression  Chest pain, unspecified chest pain type  Dyspnea   ED Assessment/Plan  Previous records reviewed. Patient had negative ED cardiac workup on 02/26/2015. She was started on omeprazole.   EKG: Normal sinus rhythm, rate 89. Normal axis. Slightly prolonged QT. No hypertrophy. No ST elevation. Compared to EKG from  02/27/2016. EKG was obtained while patient was symptomatic.   Concern for atypical chest pain given that the chest pain has changed and she has new shortness of breath. Plan to transfer to the ED for comprehensive workup.   on reevaluation, patient states that she is asymptomatic. Vitals are normal. Feel that she is stable to go by shuttle. She has no EKG changes.   Discussed MDM, plan and followup with patient  Patient agrees with plan.   *This clinic note was created using Dragon dictation software. Therefore, there may be occasional mistakes despite careful proofreading.  ?  Domenick Gong, MD 04/08/16 1747  Domenick Gong, MD  04/08/16 2209 

## 2016-04-08 NOTE — ED Provider Notes (Signed)
CSN: [id number]     Arrival date & time 04/08/16  1446 History   First MD Initiated Contact with Patient 04/08/16 2211     Chief Complaint  Patient presents with  . Chest Pain  . Shortness of Breath     (Consider location/radiation/quality/duration/timing/severity/associated sxs/prior Treatment) The history is provided by the patient and medical records. No language interpreter was used.     Amy Perez is a 50 y.o. female  with a hx of tendonitis, GERD, HTN, anxiety presents to the Emergency Department complaining of gradual, intermittent, progressively worsening dull, stabbing, substernal chest pain onset 4-5 weeks ago, prior to being started on HTN medications.  Pt reports the pain is not aggravated by anything in particular, but that is happens after eating at times.  It is also worse at night when laying flat.  Pt reports hx of cholecystectomy approx 15 years ago.  She also reports increased gas production at night.  She does report new shortness of breath, dyspnea on exertion and decreased exercise tolerance present for 3 weeks.  She has used Zantac and Omeprazole in the past (last dose 2 days ago) without relief.    No unintentional weight gain, lower extremity edema, nocturia, orthopnea, PND. No calf pain, swelling, hemoptysis, surgery in the past 4 weeks, exogenous estrogen, prolonged immobilization, abdominal pain.  No history of PE, DVT, MI, coronary disease, diabetes, smoking, arrhythmia, atrial fibrillation, CHF. Family history significant for mother with MI starting in her 62s, father with MIs in his 22s.    PCP: none   Past Medical History  Diagnosis Date  . AC (acromioclavicular) joint bone spurs   . Tendonitis   . Acid reflux   . Hypertension   . Panic attacks    Past Surgical History  Procedure Laterality Date  . Tubal ligation  08/29/04  . Cholecystectomy     Family History  Problem Relation Age of Onset  . Hypertension Mother   . Hypertension Father   .  Heart attack Father    Social History  Substance Use Topics  . Smoking status: Never Smoker   . Smokeless tobacco: None  . Alcohol Use: No   OB History    No data available     Review of Systems  Constitutional: Negative for fever, diaphoresis, appetite change, fatigue and unexpected weight change.  HENT: Negative for mouth sores.   Eyes: Negative for visual disturbance.  Respiratory: Positive for shortness of breath ( with exertion). Negative for cough, chest tightness and wheezing.   Cardiovascular: Positive for chest pain ( intermittent). Negative for palpitations and leg swelling.  Gastrointestinal: Negative for nausea, vomiting, abdominal pain, diarrhea and constipation.  Endocrine: Negative for polydipsia, polyphagia and polyuria.  Genitourinary: Negative for dysuria, urgency, frequency and hematuria.  Musculoskeletal: Negative for back pain and neck stiffness.  Skin: Negative for rash.  Allergic/Immunologic: Negative for immunocompromised state.  Neurological: Negative for syncope, light-headedness and headaches.  Hematological: Does not bruise/bleed easily.  Psychiatric/Behavioral: Negative for sleep disturbance. The patient is not nervous/anxious.       Allergies  Amoxicillin; Benadryl; Oxycodone-acetaminophen; and Penicillins  Home Medications   Prior to Admission medications   Medication Sig Start Date End Date Taking? Authorizing Provider  amLODipine (NORVASC) 5 MG tablet Take 1 tablet (5 mg total) by mouth daily. 02/27/16  Yes Audry Pili, PA-C  omeprazole (PRILOSEC) 20 MG capsule Take 1 capsule (20 mg total) by mouth daily. Patient not taking: Reported on 04/08/2016 02/26/15   Everlene Farrier,  PA-C   BP 166/99 mmHg  Pulse 83  Temp(Src) 98.4 F (36.9 C) (Oral)  Resp 18  Ht  (1.676 m)  Wt 113.399 kg  BMI 40.37 kg/m2  SpO2 99%  LMP 03/11/2016 Physical Exam  Constitutional: She appears well-developed and well-nourished. No distress.  Awake, alert,  nontoxic appearance  HENT:  Head: Normocephalic and atraumatic.  Mouth/Throat: Oropharynx is clear and moist. No oropharyngeal exudate.  Eyes: Conjunctivae are normal. No scleral icterus.  Neck: Normal range of motion. Neck supple.  Cardiovascular: Normal rate, regular rhythm, normal heart sounds and intact distal pulses.   No murmur heard. Pulmonary/Chest: Effort normal and breath sounds normal. No respiratory distress. She has no wheezes.  Equal chest expansion  Abdominal: Soft. Bowel sounds are normal. She exhibits no mass. There is no tenderness. There is no rebound and no guarding.  Obese Soft and nontender  Musculoskeletal: Normal range of motion. She exhibits no edema or tenderness.  No peripheral edema No calf tenderness Neg Homan's sign  Neurological: She is alert.  Speech is clear and goal oriented Moves extremities without ataxia  Skin: Skin is warm and dry. No rash noted. She is not diaphoretic. No erythema.  Psychiatric: She has a normal mood and affect.  Nursing note and vitals reviewed.   ED Course  Procedures (including critical care time) Labs Review Labs Reviewed  CBC - Abnormal; Notable for the following:    MCV 75.6 (*)    MCH 25.1 (*)    RDW 16.5 (*)    All other components within normal limits  BASIC METABOLIC PANEL  I-STAT TROPOININ, ED  Rosezena Sensor, ED    Imaging Review Dg Chest 2 View  04/08/2016  CLINICAL DATA:  Chest pain, shortness of breath and hypertension for 4-5 weeks. EXAM: CHEST  2 VIEW COMPARISON:  Chest x-ray dated 02/26/2015. FINDINGS: Heart size is upper normal. Overall cardiomediastinal silhouette is stable in size and configuration. Lungs are clear. No evidence of pneumonia. No pleural effusion or pneumothorax seen. Mild degenerative spurring noted within the thoracic spine. No acute appearing osseous abnormality. IMPRESSION: No active cardiopulmonary disease.  No evidence of pneumonia. Electronically Signed   By: Bary Richard  M.D.   On: 04/08/2016 15:31   I have personally reviewed and evaluated these images and lab results as part of my medical decision-making.   EKG Interpretation: Initial  Date/Time:  Thursday April 08 2016 14:49:56 EDT Ventricular Rate:  97 PR Interval:  168 QRS Duration: 82 QT Interval:  392 QTC Calculation: 497 R Axis:   0 Text Interpretation:  Normal sinus rhythm Nonspecific T wave abnormality  Abnormal ECG No significant change since last tracing Confirmed by FLOYD  MD, Reuel Boom [phone]) on 04/08/2016 10:11:23 PM    ED ECG REPORT: Repeat   Date: 04/09/2016  Rate: 97  Rhythm: normal sinus rhythm  QRS Axis: normal  Intervals: normal  ST/T Wave abnormalities: nonspecific ST changes  Conduction Disutrbances:none  Narrative Interpretation: unchanged from ECG earlier in the evening  Old EKG Reviewed: unchanged  I have personally reviewed the EKG tracing and agree with the computerized printout as noted.   MDM   Final diagnoses:  Chest pain, unspecified chest pain type  SOB (shortness of breath) on exertion   Terrance Mass Wade presents for chest pain, intermittent for the last several months and SOB on exertion for the last several weeks.  Pt without risk factors for DVT/PE.  No clinical evidence of CHF or volume overload.  CXR  without pulmonary edema.  No hypoxia, tachycardia, leg swelling on exam.  No evidence of ACS. Repeat ECG and troponin are negative and unchanged.  Pt VSS throughout time here.  HTN noted, but pt reports she has not had her night medications.  She has been CP free throughout her time here in the ED (>8 hours) and remains CP free at this time.  Pt can ambulate in the ED without increased work of breathing or hypoxia.    Chest pain is not likely of cardiac or pulmonary etiology d/t presentation, PERC negative, VSS, no tracheal deviation, no JVD or new murmur, RRR, breath sounds equal bilaterally, EKG without acute abnormalities, negative troponin, and negative  CXR. Pt has been advised to return to the ED if CP becomes exertional, associated with diaphoresis or nausea, radiates to left jaw/arm, worsens or becomes concerning in any way. Pt appears reliable for follow up and is agreeable to discharge.   Case has been discussed with Dr. Adela LankFloyd who has evaluated his ECGs and agrees with the above plan to discharge.   Dahlia ClientHannah Cline Draheim, PA-C 04/09/16 0028  Melene Planan Floyd, DO 04/09/16 0031

## 2016-04-08 NOTE — ED Notes (Addendum)
Complains of chest, center chest.  [facility]  Patient reports chest pain for 3 weeks.  Patient reports tests at hospital were all negative.  Patient feels this is related to reflux.  Unable to relate pain to any behavior.  Patient reports sob for a week.  Getting sob with ambulation.  denies cold symptoms. Patient is difficult to follow history.  Patient jumps around in her history.  Not answering questions, but telling this nurse varied complaints and has already decided what she thinks is wrong.  Patient seems very tired and has continued stress

## 2016-04-08 NOTE — Discharge Instructions (Signed)
Let them know if your chest pain returns.

## 2016-04-09 MED ORDER — OMEPRAZOLE 20 MG PO CPDR: 20.0000 mg | DELAYED_RELEASE_CAPSULE | Freq: Every day | ORAL | Status: DC

## 2016-04-09 NOTE — ED Notes (Signed)
Patient Alert and oriented X4. Stable and ambulatory. Patient verbalized understanding of the discharge instructions.  Patient belongings were taken by the patient.  

## 2016-04-09 NOTE — Discharge Instructions (Signed)
1. Medications: Resume omeprazole, usual home medications 2. Treatment: rest, drink plenty of fluids,  3. Follow Up: Please followup with your primary doctor in 2-3 days for discussion of your diagnoses and further evaluation after today's visit; if you do not have a primary care doctor use the resource guide provided to find one; Please return to the ER for worsening chest pain, CP that occurs with exertion, radiates to the arm or is accompanied by shortness of breath, nausea, diaphoresis, or other concerning symptoms    Nonspecific Chest Pain  Chest pain can be caused by many different conditions. There is always a chance that your pain could be related to something serious, such as a heart attack or a blood clot in your lungs. Chest pain can also be caused by conditions that are not life-threatening. If you have chest pain, it is very important to follow up with your health care provider. CAUSES  Chest pain can be caused by:  Heartburn.  Pneumonia or bronchitis.  Anxiety or stress.  Inflammation around your heart (pericarditis) or lung (pleuritis or pleurisy).  A blood clot in your lung.  A collapsed lung (pneumothorax). It can develop suddenly on its own (spontaneous pneumothorax) or from trauma to the chest.  Shingles infection (varicella-zoster virus).  Heart attack.  Damage to the bones, muscles, and cartilage that make up your chest wall. This can include:  Bruised bones due to injury.  Strained muscles or cartilage due to frequent or repeated coughing or overwork.  Fracture to one or more ribs.  Sore cartilage due to inflammation (costochondritis). RISK FACTORS  Risk factors for chest pain may include:  Activities that increase your risk for trauma or injury to your chest.  Respiratory infections or conditions that cause frequent coughing.  Medical conditions or overeating that can cause heartburn.  Heart disease or family history of heart disease.  Conditions  or health behaviors that increase your risk of developing a blood clot.  Having had chicken pox (varicella zoster). SIGNS AND SYMPTOMS Chest pain can feel like:  Burning or tingling on the surface of your chest or deep in your chest.  Crushing, pressure, aching, or squeezing pain.  Dull or sharp pain that is worse when you move, cough, or take a deep breath.  Pain that is also felt in your back, neck, shoulder, or arm, or pain that spreads to any of these areas. Your chest pain may come and go, or it may stay constant. DIAGNOSIS Lab tests or other studies may be needed to find the cause of your pain. Your health care provider may have you take a test called an ambulatory ECG (electrocardiogram). An ECG records your heartbeat patterns at the time the test is performed. You may also have other tests, such as:  Transthoracic echocardiogram (TTE). During echocardiography, sound waves are used to create a picture of all of the heart structures and to look at how blood flows through your heart.  Transesophageal echocardiogram (TEE).This is a more advanced imaging test that obtains images from inside your body. It allows your health care provider to see your heart in finer detail.  Cardiac monitoring. This allows your health care provider to monitor your heart rate and rhythm in real time.  Holter monitor. This is a portable device that records your heartbeat and can help to diagnose abnormal heartbeats. It allows your health care provider to track your heart activity for several days, if needed.  Stress tests. These can be done through exercise or  by taking medicine that makes your heart beat more quickly.  Blood tests.  Imaging tests. TREATMENT  Your treatment depends on what is causing your chest pain. Treatment may include:  Medicines. These may include:  Acid blockers for heartburn.  Anti-inflammatory medicine.  Pain medicine for inflammatory conditions.  Antibiotic medicine,  if an infection is present.  Medicines to dissolve blood clots.  Medicines to treat coronary artery disease.  Supportive care for conditions that do not require medicines. This may include:  Resting.  Applying heat or cold packs to injured areas.  Limiting activities until pain decreases. HOME CARE INSTRUCTIONS  If you were prescribed an antibiotic medicine, finish it all even if you start to feel better.  Avoid any activities that bring on chest pain.  Do not use any tobacco products, including cigarettes, chewing tobacco, or electronic cigarettes. If you need help quitting, ask your health care provider.  Do not drink alcohol.  Take medicines only as directed by your health care provider.  Keep all follow-up visits as directed by your health care provider. This is important. This includes any further testing if your chest pain does not go away.  If heartburn is the cause for your chest pain, you may be told to keep your head raised (elevated) while sleeping. This reduces the chance that acid will go from your stomach into your esophagus.  Make lifestyle changes as directed by your health care provider. These may include:  Getting regular exercise. Ask your health care provider to suggest some activities that are safe for you.  Eating a heart-healthy diet. A registered dietitian can help you to learn healthy eating options.  Maintaining a healthy weight.  Managing diabetes, if necessary.  Reducing stress. SEEK MEDICAL CARE IF:  Your chest pain does not go away after treatment.  You have a rash with blisters on your chest.  You have a fever. SEEK IMMEDIATE MEDICAL CARE IF:   Your chest pain is worse.  You have an increasing cough, or you cough up blood.  You have severe abdominal pain.  You have severe weakness.  You faint.  You have chills.  You have sudden, unexplained chest discomfort.  You have sudden, unexplained discomfort in your arms, back, neck,  or jaw.  You have shortness of breath at any time.  You suddenly start to sweat, or your skin gets clammy.  You feel nauseous or you vomit.  You suddenly feel light-headed or dizzy.  Your heart begins to beat quickly, or it feels like it is skipping beats. These symptoms may represent a serious problem that is an emergency. Do not wait to see if the symptoms will go away. Get medical help right away. Call your local emergency services (911 in the [place] Do not drive yourself to the hospital.   This information is not intended to replace advice given to you by your health care provider. Make sure you discuss any questions you have with your health care provider.   Document Released: 07/21/2005 Document Revised: 11/01/2014 Document Reviewed: 05/17/2014 Elsevier Interactive Patient Education [employer] Elsevier Inc.

## 2016-04-13 ENCOUNTER — Telehealth: Payer: Self-pay | Admitting: Behavioral Health

## 2016-04-13 NOTE — Telephone Encounter (Signed)
Unable to reach patient at time of Pre-Visit Call.  Left message for patient to return call when available.    

## 2016-04-14 ENCOUNTER — Encounter: Payer: Self-pay | Admitting: Physician Assistant

## 2016-04-14 ENCOUNTER — Ambulatory Visit (INDEPENDENT_AMBULATORY_CARE_PROVIDER_SITE_OTHER): Payer: BLUE CROSS/BLUE SHIELD | Admitting: Physician Assistant

## 2016-04-14 VITALS — BP 156/98 | HR 88 | Temp 98.1°F | Resp 16 | Ht 65.5 in | Wt 282.5 lb

## 2016-04-14 DIAGNOSIS — I10 Essential (primary) hypertension: Secondary | ICD-10-CM

## 2016-04-14 DIAGNOSIS — K219 Gastro-esophageal reflux disease without esophagitis: Secondary | ICD-10-CM | POA: Diagnosis not present

## 2016-04-14 DIAGNOSIS — F411 Generalized anxiety disorder: Secondary | ICD-10-CM

## 2016-04-14 DIAGNOSIS — R0600 Dyspnea, unspecified: Secondary | ICD-10-CM

## 2016-04-14 MED ORDER — DULOXETINE HCL 20 MG PO CPEP: 20.0000 mg | ORAL_CAPSULE | Freq: Every day | ORAL | Status: DC

## 2016-04-14 MED ORDER — ALPRAZOLAM 0.25 MG PO TABS: 0.2500 mg | ORAL_TABLET | Freq: Two times a day (BID) | ORAL | Status: DC | PRN

## 2016-04-14 MED ORDER — HYDROCHLOROTHIAZIDE 25 MG PO TABS: 25.0000 mg | ORAL_TABLET | Freq: Every day | ORAL | Status: DC

## 2016-04-14 MED ORDER — PANTOPRAZOLE SODIUM 40 MG PO TBEC
40.0000 mg | DELAYED_RELEASE_TABLET | Freq: Every day | ORAL | Status: DC
Start: 2016-04-14 — End: 2016-06-10

## 2016-04-14 MED FILL — ALPRAZolam 0.25 MG TABS: 0.25 | 10 days supply | Qty: 20 | Fill #0

## 2016-04-14 MED FILL — DULoxetine HCL 20 MG CPEP: 20 | 30 days supply | Qty: 30 | Fill #0

## 2016-04-14 MED FILL — PANTOPRAZOLE SOD DR 40 MG T: 40 | 30 days supply | Qty: 30 | Fill #0

## 2016-04-14 MED FILL — HYDROCHLOROTHIAZIDE 25 MG T: 25 | 30 days supply | Qty: 30 | Fill #0

## 2016-04-14 NOTE — Progress Notes (Signed)
Pre visit review using our clinic review tool, if applicable. No additional management support is needed unless otherwise documented below in the visit note/SLS  

## 2016-04-14 NOTE — Progress Notes (Signed)
  Patient presents to clinic today to establish care. Patient is also an ER follow-up for unspecified chest pain.   Patient presented to the ER on 04/08/16 c/o gradually worsening stabbing chest pain. Patient also had noted worsening heart burn and reflux, worse at night, despite taking OTC Prilosec. Repeat EKGs and troponin levels were negative and unchanged. Pain was felt to be non-cardiac in nature. Patient discharged with strict return precautions.   Since discharge, patient denies recurrence of chest pain. Does note continued reflux despite use of PPI. Denies nausea or vomiting but does note occasional epigastric discomfort. Again worse after meals, bedtime and on waking in the morning. Patient notes history of gastric ulcer in her early 30s that healed. Denies hx of H. Pylori. Denies melena, hematochezia or tenesmus. Does note some intermittent constipation depending on diet.  Patient does note 2 months of dyspnea on exertion. Notes she will become winded and fatigued even walking from her car into a shopping center or business. Denies history of asthma or COPD. Denies smoking history. Does note chest discomfort during these episodes, described as pressure and not pain. Denies personal history of MI or CAD. Again recent ER assessment negative. Patient does note + family history of early CAD.   Patient with history of hypertension, recently placed on amlodipine over the past month. Is taking as directed. Notes lightheadedness with taking the medications. Denies palpitations. Is checking BP at home. Endorses BP varies. Last 3 readings are as follows: 150/80 120/70 and 118/90.   Chronic Issues: Patient has history of generalized anxiety and panic attacks in the past. Endorses this improved on its own but has now recurred secondary to home stressors, work stressors and recent health issues.  Does note some labile mood with anhedonia. Denies SI/HI.     Past Medical History  Diagnosis Date  . AC  (acromioclavicular) joint bone spurs   . Tendonitis     Feet  . Acid reflux   . Hypertension   . Panic attacks   . Plantar fasciitis   . Heel spur   . Anxiety   . History of chicken pox   . GERD (gastroesophageal reflux disease)   . Gastric ulcer   . CTS (carpal tunnel syndrome)     Bilateral    Past Surgical History  Procedure Laterality Date  . Tubal ligation  08/29/04  . Cholecystectomy    . Wisdom tooth extraction      No current outpatient prescriptions on file prior to visit.   No current facility-administered medications on file prior to visit.    Allergies  Allergen Reactions  . Amoxicillin Nausea Only  . Benadryl [Diphenhydramine Hcl] Itching  . Oxycodone-Acetaminophen Nausea And Vomiting  . Penicillins Nausea Only    Family History  Problem Relation Age of Onset  . Hypertension Mother     Living  . Hypertension Father 61    Deceased  . Heart attack Father   . Arthritis Mother   . Arthritis Sister     x2  . Diabetes Mother   . Lung cancer Sister     Deceased  . Cancer Other     Paternal & Maternal Aunts  . Dementia Mother   . Heart attack Maternal Grandmother   . Neuropathy Sister   . Heart disease Brother     x1  . Alcoholism Brother     x2    Social History   Social History  . Marital Status: Single    Spouse   Name: N/A  . Number of Children: N/A  . Years of Education: N/A   Occupational History  . Not on file.   Social History Main Topics  . Smoking status: Never Smoker   . Smokeless tobacco: Not on file  . Alcohol Use: No  . Drug Use: No  . Sexual Activity: Not on file   Other Topics Concern  . Not on file   Social History Narrative    Review of Systems  Constitutional: Negative for fever and malaise/fatigue.  HENT: Negative for hearing loss.   Respiratory: Positive for shortness of breath. Negative for cough, hemoptysis, sputum production and wheezing.   Cardiovascular: Positive for chest pain. Negative for  palpitations, leg swelling and PND.  Gastrointestinal: Positive for heartburn, nausea and constipation. Negative for vomiting, abdominal pain, diarrhea, blood in stool and melena.  Genitourinary: Negative.   Neurological: Negative for dizziness, loss of consciousness and headaches.  Psychiatric/Behavioral: Positive for depression. Negative for suicidal ideas, hallucinations and substance abuse. The patient is nervous/anxious. The patient does not have insomnia.     BP 156/98 mmHg  Pulse 88  Temp(Src) 98.1 F (36.7 C) (Oral)  Resp 16  Ht 5' 5.5" (1.664 m)  Wt 282 lb 8 oz (128.141 kg)  BMI 46.28 kg/m2  SpO2 99%  LMP 03/11/2016  Physical Exam  Constitutional: She is oriented to person, place, and time and well-developed, well-nourished, and in no distress.  HENT:  Head: Normocephalic and atraumatic.  Right Ear: External ear normal.  Left Ear: External ear normal.  Nose: Nose normal.  Mouth/Throat: Oropharynx is clear and moist. No oropharyngeal exudate.  TM within normal lmits  Eyes: Conjunctivae are normal. Pupils are equal, round, and reactive to light.  Neck: Neck supple. No thyromegaly present.  Cardiovascular: Normal rate, regular rhythm, normal heart sounds and intact distal pulses.   Pulmonary/Chest: Effort normal and breath sounds normal. No respiratory distress. She has no wheezes. She has no rales. She exhibits no tenderness.  Abdominal: Soft. Bowel sounds are normal. She exhibits no distension and no mass. There is no tenderness. There is no rebound and no guarding.  Neurological: She is alert and oriented to person, place, and time. No cranial nerve deficit.  Skin: Skin is warm and dry. No rash noted.  Psychiatric: Affect normal.  Vitals reviewed.   Recent Results (from the past 2160 hour(s))  CBC     Status: Abnormal   Collection Time: 02/27/16  2:30 PM  Result Value Ref Range   WBC 7.8 4.0 - 10.5 K/uL   RBC 4.97 3.87 - 5.11 MIL/uL   Hemoglobin 12.8 12.0 - 15.0  g/dL   HCT 38.3 36.0 - 46.0 %   MCV 77.1 (L) 78.0 - 100.0 fL   MCH 25.8 (L) 26.0 - 34.0 pg   MCHC 33.4 30.0 - 36.0 g/dL   RDW 16.8 (H) 11.5 - 15.5 %   Platelets 322 150 - 400 K/uL  Basic metabolic panel     Status: Abnormal   Collection Time: 02/27/16  2:30 PM  Result Value Ref Range   Sodium 139 135 - 145 mmol/L   Potassium 3.1 (L) 3.5 - 5.1 mmol/L   Chloride 106 101 - 111 mmol/L   CO2 26 22 - 32 mmol/L   Glucose, Bld 102 (H) 65 - 99 mg/dL   BUN 7 6 - 20 mg/dL   Creatinine, Ser 0.83 0.44 - 1.00 mg/dL   Calcium 9.0 8.9 - 10.3 mg/dL   GFR calc non   Af Amer >60 >60 mL/min   GFR calc Af Amer >60 >60 mL/min    Comment: (NOTE) The eGFR has been calculated using the CKD EPI equation. This calculation has not been validated in all clinical situations. eGFR's persistently <60 mL/min signify possible Chronic Kidney Disease.    Anion gap 7 5 - 15  Troponin I     Status: None   Collection Time: 02/27/16  2:30 PM  Result Value Ref Range   Troponin I <0.03 <0.031 ng/mL    Comment:        NO INDICATION OF MYOCARDIAL INJURY.   Urinalysis, Routine w reflex microscopic (not at [facility])     Status: Abnormal   Collection Time: 02/27/16  4:26 PM  Result Value Ref Range   Color, Urine YELLOW YELLOW   APPearance CLOUDY (A) CLEAR   Specific Gravity, Urine 1.013 1.005 - 1.030   pH 7.5 5.0 - 8.0   Glucose, UA NEGATIVE NEGATIVE mg/dL   Hgb urine dipstick LARGE (A) NEGATIVE   Bilirubin Urine NEGATIVE NEGATIVE   Ketones, ur NEGATIVE NEGATIVE mg/dL   Protein, ur NEGATIVE NEGATIVE mg/dL   Nitrite NEGATIVE NEGATIVE   Leukocytes, UA TRACE (A) NEGATIVE  Pregnancy, urine     Status: None   Collection Time: 02/27/16  4:26 PM  Result Value Ref Range   Preg Test, Ur NEGATIVE NEGATIVE    Comment:        THE SENSITIVITY OF THIS METHODOLOGY IS >20 mIU/mL.   Urine microscopic-add on     Status: Abnormal   Collection Time: 02/27/16  4:26 PM  Result Value Ref Range   Squamous Epithelial / LPF 6-30 (A)  NONE SEEN   WBC, UA 0-5 0 - 5 WBC/hpf   RBC / HPF 0-5 0 - 5 RBC/hpf   Bacteria, UA MANY (A) NONE SEEN  Basic metabolic panel     Status: None   Collection Time: 04/08/16  2:55 PM  Result Value Ref Range   Sodium 138 135 - 145 mmol/L   Potassium 3.5 3.5 - 5.1 mmol/L   Chloride 107 101 - 111 mmol/L   CO2 24 22 - 32 mmol/L   Glucose, Bld 91 65 - 99 mg/dL   BUN 7 6 - 20 mg/dL   Creatinine, Ser 0.88 0.44 - 1.00 mg/dL   Calcium 9.2 8.9 - 10.3 mg/dL   GFR calc non Af Amer >60 >60 mL/min   GFR calc Af Amer >60 >60 mL/min    Comment: (NOTE) The eGFR has been calculated using the CKD EPI equation. This calculation has not been validated in all clinical situations. eGFR's persistently <60 mL/min signify possible Chronic Kidney Disease.    Anion gap 7 5 - 15  CBC     Status: Abnormal   Collection Time: 04/08/16  2:55 PM  Result Value Ref Range   WBC 6.9 4.0 - 10.5 K/uL   RBC 5.01 3.87 - 5.11 MIL/uL   Hemoglobin 12.6 12.0 - 15.0 g/dL   HCT 37.9 36.0 - 46.0 %   MCV 75.6 (L) 78.0 - 100.0 fL   MCH 25.1 (L) 26.0 - 34.0 pg   MCHC 33.2 30.0 - 36.0 g/dL   RDW 16.5 (H) 11.5 - 15.5 %   Platelets 337 150 - 400 K/uL  I-stat troponin, ED     Status: None   Collection Time: 04/08/16  3:11 PM  Result Value Ref Range   Troponin i, poc 0.03 0.00 - 0.08 ng/mL   Comment 3  Comment: Due to the release kinetics of cTnI, a negative result within the first hours of the onset of symptoms does not rule out myocardial infarction with certainty. If myocardial infarction is still suspected, repeat the test at appropriate intervals.   I-stat troponin, ED     Status: None   Collection Time: 04/08/16 10:52 PM  Result Value Ref Range   Troponin i, poc 0.01 0.00 - 0.08 ng/mL   Comment 3            Comment: Due to the release kinetics of cTnI, a negative result within the first hours of the onset of symptoms does not rule out myocardial infarction with certainty. If myocardial infarction is  still suspected, repeat the test at appropriate intervals.   Comp Met (CMET)     Status: None   Collection Time: 04/14/16  4:01 PM  Result Value Ref Range   Sodium 138 135 - 145 mEq/L   Potassium 4.1 3.5 - 5.1 mEq/L   Chloride 104 96 - 112 mEq/L   CO2 30 19 - 32 mEq/L   Glucose, Bld 83 70 - 99 mg/dL   BUN 7 6 - 23 mg/dL   Creatinine, Ser 0.93 0.40 - 1.20 mg/dL   Total Bilirubin 0.4 0.2 - 1.2 mg/dL   Alkaline Phosphatase 70 39 - 117 U/L   AST 17 0 - 37 U/L   ALT 11 0 - 35 U/L   Total Protein 8.0 6.0 - 8.3 g/dL   Albumin 4.1 3.5 - 5.2 g/dL   Calcium 9.7 8.4 - 10.5 mg/dL   GFR 82.31 >60.00 mL/min  H. pylori antibody, IgG     Status: None   Collection Time: 04/14/16  4:01 PM  Result Value Ref Range   H Pylori IgG Negative Negative  Lipase     Status: None   Collection Time: 04/14/16  4:01 PM  Result Value Ref Range   Lipase 37.0 11.0 - 59.0 U/L  CBC     Status: Abnormal   Collection Time: 04/14/16  4:01 PM  Result Value Ref Range   WBC 6.7 4.0 - 10.5 K/uL   RBC 4.91 3.87 - 5.11 Mil/uL   Platelets 382.0 150.0 - 400.0 K/uL   Hemoglobin 12.5 12.0 - 15.0 g/dL   HCT 38.0 36.0 - 46.0 %   MCV 77.4 (L) 78.0 - 100.0 fl   MCHC 33.0 30.0 - 36.0 g/dL   RDW 17.7 (H) 11.5 - 15.5 %    Assessment/Plan: Essential hypertension Cannot tolerate amlodipine well. Will discontinue and begin HCTZ 25 mg daily. DASH diet reviewed. Giving dyspnea and family history, will check labs today and set up for echocardiogram and stress testing.FU scheduled.   Gastroesophageal reflux disease without esophagitis OTC Prilosec subtherapeutic. Will stop and begin Protonix 40 mg daily. Will check labs to include CMP, Lipase and H. Pylori IgG. Will begin GERD diet. Supportive measures reviewed. FU scheduled.  Dyspnea Suspect Multifactorial. Body mass index is 46.28 kg/(m^2). + family history of early CAD. Will check labs today and order echocardiogram and stress testing to further assess. Working on BP control.  Exercise is currently somewhat limited due to dyspnea. Supportive measures reviewed. Alarm signs/symptoms discussed that would warrant ER assessment.  Generalized anxiety disorder Will begin low-dose Cymbalta. Start as directed. Short term script for Xanax written for acute anxiety while waiting on Cymbalta to reach a therapeutic effect. Counseling recommended. Handout given. FU scheduled.     ,  Cody, PA-C   

## 2016-04-14 NOTE — Patient Instructions (Signed)
Please go to the lab for blood work. I will call you with your results.  We are making medication changes today with several new medications. I want us to introduce them in stages.  First off, for blood pressure, please stop the amlodipine and begin the hydrochlorothiazide taking as directed.  Try to limit foods high in salt. Keep checking your BP at home and write down. Bring these numbers to your follow-up in 2 weeks.  Go ahead and stop the Prilosec. Start the Protonix daily. You may want to add on an OTC 150 mg Zantac each night for a few nights until the Protonix kicks in. Follow the diet at the bottom of this summary. Follow-up in 2 weeks.  After tolerating these new medications for a few days ---  Start the Cymbalta daily for anxiety. This will take a few weeks to kick in but will hopefully help with anxiety and with pain. Use the Xanax as directed if needed for panic attack. Review the handout I have given you on our counselors. You can call that number to schedule an appointment if you would like.   You will be contacted to schedule an Echocardiogram (Ultrasound of your heart) and a Stress Test.  If you develop any significant chest pain, shortness of breath, lightheadedness or dizziness before our follow-up, please call 911 or go to the ER.  Food Choices for Gastroesophageal Reflux Disease, Adult When you have gastroesophageal reflux disease (GERD), the foods you eat and your eating habits are very important. Choosing the right foods can help ease the discomfort of GERD. WHAT GENERAL GUIDELINES DO I NEED TO FOLLOW?  Choose fruits, vegetables, whole grains, low-fat dairy products, and low-fat meat, fish, and poultry.  Limit fats such as oils, salad dressings, butter, nuts, and avocado.  Keep a food diary to identify foods that cause symptoms.  Avoid foods that cause reflux. These may be different for different people.  Eat frequent small meals instead of three large meals each  day.  Eat your meals slowly, in a relaxed setting.  Limit fried foods.  Cook foods using methods other than frying.  Avoid drinking alcohol.  Avoid drinking large amounts of liquids with your meals.  Avoid bending over or lying down until 2-3 hours after eating. WHAT FOODS ARE NOT RECOMMENDED? The following are some foods and drinks that may worsen your symptoms: Vegetables Tomatoes. Tomato juice. Tomato and spaghetti sauce. Chili peppers. Onion and garlic. Horseradish. Fruits Oranges, grapefruit, and lemon (fruit and juice). Meats High-fat meats, fish, and poultry. This includes hot dogs, ribs, ham, sausage, salami, and bacon. Dairy Whole milk and chocolate milk. Sour cream. Cream. Butter. Ice cream. Cream cheese.  Beverages Coffee and tea, with or without caffeine. Carbonated beverages or energy drinks. Condiments Hot sauce. Barbecue sauce.  Sweets/Desserts Chocolate and cocoa. Donuts. Peppermint and spearmint. Fats and Oils High-fat foods, including [place] fries and potato chips. Other Vinegar. Strong spices, such as black pepper, white pepper, red pepper, cayenne, curry powder, cloves, ginger, and chili powder. The items listed above may not be a complete list of foods and beverages to avoid. Contact your dietitian for more information.   This information is not intended to replace advice given to you by your health care provider. Make sure you discuss any questions you have with your health care provider.   Document Released: 10/11/2005 Document Revised: 11/01/2014 Document Reviewed: 08/15/2013 Elsevier Interactive Patient Education [employer]

## 2016-04-15 LAB — CBC
HEMATOCRIT: 38 % (ref 36.0–46.0)
Hemoglobin: 12.5 g/dL (ref 12.0–15.0)
MCHC: 33 g/dL (ref 30.0–36.0)
MCV: 77.4 fl — AB (ref 78.0–100.0)
PLATELETS: 382 10*3/uL (ref 150.0–400.0)
RBC: 4.91 Mil/uL (ref 3.87–5.11)
RDW: 17.7 % — ABNORMAL HIGH (ref 11.5–15.5)
WBC: 6.7 10*3/uL (ref 4.0–10.5)

## 2016-04-15 LAB — COMPREHENSIVE METABOLIC PANEL
ALBUMIN: 4.1 g/dL (ref 3.5–5.2)
ALK PHOS: 70 U/L (ref 39–117)
ALT: 11 U/L (ref 0–35)
AST: 17 U/L (ref 0–37)
BUN: 7 mg/dL (ref 6–23)
CHLORIDE: 104 meq/L (ref 96–112)
CO2: 30 mEq/L (ref 19–32)
Calcium: 9.7 mg/dL (ref 8.4–10.5)
Creatinine, Ser: 0.93 mg/dL (ref 0.40–1.20)
GFR: 82.31 mL/min (ref >60.00)
GLUCOSE: 83 mg/dL (ref 70–99)
POTASSIUM: 4.1 meq/L (ref 3.5–5.1)
SODIUM: 138 meq/L (ref 135–145)
Total Bilirubin: 0.4 mg/dL (ref 0.2–1.2)
Total Protein: 8 g/dL (ref 6.0–8.3)

## 2016-04-15 LAB — H. PYLORI ANTIBODY, IGG: H PYLORI IGG: NEGATIVE

## 2016-04-15 LAB — LIPASE: Lipase: 37 U/L (ref 11.0–59.0)

## 2016-04-26 DIAGNOSIS — K219 Gastro-esophageal reflux disease without esophagitis: Secondary | ICD-10-CM | POA: Insufficient documentation

## 2016-04-26 DIAGNOSIS — F411 Generalized anxiety disorder: Secondary | ICD-10-CM | POA: Insufficient documentation

## 2016-04-26 DIAGNOSIS — I10 Essential (primary) hypertension: Secondary | ICD-10-CM | POA: Insufficient documentation

## 2016-04-26 DIAGNOSIS — R0600 Dyspnea, unspecified: Secondary | ICD-10-CM | POA: Insufficient documentation

## 2016-04-26 NOTE — Assessment & Plan Note (Signed)
OTC Prilosec subtherapeutic. Will stop and begin Protonix 40 mg daily. Will check labs to include CMP, Lipase and H. Pylori IgG. Will begin GERD diet. Supportive measures reviewed. FU scheduled.

## 2016-04-26 NOTE — Assessment & Plan Note (Signed)
Suspect Multifactorial. Body mass index is 46.28 kg/(m^2). + family history of early CAD. Will check labs today and order echocardiogram and stress testing to further assess. Working on BP control. Exercise is currently somewhat limited due to dyspnea. Supportive measures reviewed. Alarm signs/symptoms discussed that would warrant ER assessment.

## 2016-04-26 NOTE — Assessment & Plan Note (Signed)
Cannot tolerate amlodipine well. Will discontinue and begin HCTZ 25 mg daily. DASH diet reviewed. Giving dyspnea and family history, will check labs today and set up for echocardiogram and stress testing.FU scheduled.

## 2016-04-26 NOTE — Assessment & Plan Note (Signed)
Will begin low-dose Cymbalta. Start as directed. Short term script for Xanax written for acute anxiety while waiting on Cymbalta to reach a therapeutic effect. Counseling recommended. Handout given. FU scheduled.

## 2016-04-28 ENCOUNTER — Ambulatory Visit (INDEPENDENT_AMBULATORY_CARE_PROVIDER_SITE_OTHER): Payer: BLUE CROSS/BLUE SHIELD | Admitting: Physician Assistant

## 2016-04-28 ENCOUNTER — Encounter: Payer: Self-pay | Admitting: Physician Assistant

## 2016-04-28 VITALS — BP 138/96 | HR 90 | Temp 98.3°F | Resp 16 | Ht 66.0 in | Wt 272.2 lb

## 2016-04-28 DIAGNOSIS — F411 Generalized anxiety disorder: Secondary | ICD-10-CM | POA: Diagnosis not present

## 2016-04-28 DIAGNOSIS — I10 Essential (primary) hypertension: Secondary | ICD-10-CM

## 2016-04-28 DIAGNOSIS — K219 Gastro-esophageal reflux disease without esophagitis: Secondary | ICD-10-CM | POA: Diagnosis not present

## 2016-04-28 NOTE — Patient Instructions (Signed)
Please continue medications as directed. Increase fluids and continue the acid reflux diet discussed at last visit.  I encourage you to increase hydration and the amount of fiber in your diet.  Start a daily probiotic (Align, Culturelle, Digestive Advantage, etc.). If no bowel movement within 24 hours, take 2 Tbs of Milk of Magnesia in a 4 oz glass of warmed prune juice every 2-3 days to help promote bowel movement. If no results within 24 hours, then repeat above regimen, adding a Dulcolax stool softener to regimen. If this does not promote a bowel movement, please call the office.  Follow-up 2 months. I will call you in 2 weeks to see how you are!

## 2016-05-02 NOTE — Assessment & Plan Note (Signed)
Marked improvement in symptoms. Continue Protonix. Will continue to work on diet and weight loss to further improve symptoms. FU scheduled.

## 2016-05-02 NOTE — Assessment & Plan Note (Signed)
Is tolerating Cymbalta very well. Continue current regimen. Continue Xanax PRN for acute anxiety. FU 2 months.

## 2016-05-02 NOTE — Assessment & Plan Note (Signed)
BP improved on recheck. Home BP normotensive. Will continue current regimen. DASH diet again reiterated to patient. Will review Echo and Stress test results once she has completed them next week. FU scheduled.

## 2016-05-02 NOTE — Progress Notes (Signed)
Patient presents to clinic today for follow-up of hypertension, GERD and anxiety.  Patient was placed on HCTZ 25 mg daily. Endorses taking daily as directed. Patient denies chest pain, palpitations, lightheadedness, dizziness, vision changes or frequent headaches. Endorses Home BPs have looked good. Has machine for review with BP at 118/83, 119/80, 121/85. Is scheduled for Echo and stress test on 05/04/16 due to prior complaints of chest discomfort at last visit.   Patient has been taking her Protonix as directed for suspect GERD secondary to complaints of epigastric/cehst discomfort and nighttime reflux. Endorses marked improvement in symptoms with resolution of pain. Is watching her diet.   For Generalized anxiety, patient endorses doing well with combination of Cymbalta and Xanax. Has not had to use Xanax since being prescribed, but states it does make her feel calmer to know she has it if needed. Denies depressed mood or anhedonia.   Past Medical History  Diagnosis Date  . AC (acromioclavicular) joint bone spurs   . Tendonitis     Feet  . Acid reflux   . Hypertension   . Panic attacks   . Plantar fasciitis   . Heel spur   . Anxiety   . History of chicken pox   . GERD (gastroesophageal reflux disease)   . Gastric ulcer   . CTS (carpal tunnel syndrome)     Bilateral    Current Outpatient Prescriptions on File Prior to Visit  Medication Sig Dispense Refill  . ALPRAZolam (XANAX) 0.25 MG tablet Take 1 tablet (0.25 mg total) by mouth 2 (two) times daily as needed for anxiety. 20 tablet 0  . DULoxetine (CYMBALTA) 20 MG capsule Take 1 capsule (20 mg total) by mouth daily. 30 capsule 1  . hydrochlorothiazide (HYDRODIURIL) 25 MG tablet Take 1 tablet (25 mg total) by mouth daily. 30 tablet 1  . pantoprazole (PROTONIX) 40 MG tablet Take 1 tablet (40 mg total) by mouth daily. 30 tablet 1  . Multiple Vitamin (MULTIVITAMIN) tablet Take 1 tablet by mouth daily. Reported on 04/28/2016     No  current facility-administered medications on file prior to visit.    Allergies  Allergen Reactions  . Amoxicillin Nausea Only  . Benadryl [Diphenhydramine Hcl] Itching  . Oxycodone-Acetaminophen Nausea And Vomiting  . Penicillins Nausea Only    Family History  Problem Relation Age of Onset  . Hypertension Mother     Living  . Hypertension Father 60    Deceased  . Heart attack Father   . Arthritis Mother   . Arthritis Sister     x2  . Diabetes Mother   . Lung cancer Sister     Deceased  . Cancer Other     Paternal & Maternal Aunts  . Dementia Mother   . Heart attack Maternal Grandmother   . Neuropathy Sister   . Heart disease Brother     x1  . Alcoholism Brother     x2    Social History   Social History  . Marital Status: Single    Spouse Name: N/A  . Number of Children: N/A  . Years of Education: N/A   Social History Main Topics  . Smoking status: Never Smoker   . Smokeless tobacco: None  . Alcohol Use: No  . Drug Use: No  . Sexual Activity: Not Asked   Other Topics Concern  . None   Social History Narrative    Review of Systems - See HPI.  All other ROS are negative.  BP  138/96 mmHg  Pulse 90  Temp(Src) 98.3 F (36.8 C) (Oral)  Resp 16  Ht '5\' 6"'  (1.676 m)  Wt 272 lb 4 oz (123.492 kg)  BMI 43.96 kg/m2  SpO2 100%  LMP 03/11/2016  Physical Exam  Constitutional: She is oriented to person, place, and time and well-developed, well-nourished, and in no distress.  HENT:  Head: Normocephalic and atraumatic.  Right Ear: External ear normal.  Left Ear: External ear normal.  Nose: Nose normal.  Mouth/Throat: Oropharynx is clear and moist. No oropharyngeal exudate.  Eyes: Conjunctivae are normal.  Neck: Neck supple.  Cardiovascular: Normal rate, regular rhythm, normal heart sounds and intact distal pulses.   Pulmonary/Chest: Effort normal and breath sounds normal. No respiratory distress. She has no wheezes. She has no rales. She exhibits no  tenderness.  Abdominal: Soft. Bowel sounds are normal. She exhibits no distension. There is no tenderness.  Neurological: She is alert and oriented to person, place, and time.  Skin: Skin is warm and dry. No rash noted.  Psychiatric: Affect normal.  Vitals reviewed.   Recent Results (from the past 2160 hour(s))  CBC     Status: Abnormal   Collection Time: 02/27/16  2:30 PM  Result Value Ref Range   WBC 7.8 4.0 - 10.5 K/uL   RBC 4.97 3.87 - 5.11 MIL/uL   Hemoglobin 12.8 12.0 - 15.0 g/dL   HCT 38.3 36.0 - 46.0 %   MCV 77.1 (L) 78.0 - 100.0 fL   MCH 25.8 (L) 26.0 - 34.0 pg   MCHC 33.4 30.0 - 36.0 g/dL   RDW 16.8 (H) 11.5 - 15.5 %   Platelets 322 150 - 400 K/uL  Basic metabolic panel     Status: Abnormal   Collection Time: 02/27/16  2:30 PM  Result Value Ref Range   Sodium 139 135 - 145 mmol/L   Potassium 3.1 (L) 3.5 - 5.1 mmol/L   Chloride 106 101 - 111 mmol/L   CO2 26 22 - 32 mmol/L   Glucose, Bld 102 (H) 65 - 99 mg/dL   BUN 7 6 - 20 mg/dL   Creatinine, Ser 0.83 0.44 - 1.00 mg/dL   Calcium 9.0 8.9 - 10.3 mg/dL   GFR calc non Af Amer >60 >60 mL/min   GFR calc Af Amer >60 >60 mL/min    Comment: (NOTE) The eGFR has been calculated using the CKD EPI equation. This calculation has not been validated in all clinical situations. eGFR's persistently <60 mL/min signify possible Chronic Kidney Disease.    Anion gap 7 5 - 15  Troponin I     Status: None   Collection Time: 02/27/16  2:30 PM  Result Value Ref Range   Troponin I <0.03 <0.031 ng/mL    Comment:        NO INDICATION OF MYOCARDIAL INJURY.   Urinalysis, Routine w reflex microscopic (not at [facility])     Status: Abnormal   Collection Time: 02/27/16  4:26 PM  Result Value Ref Range   Color, Urine YELLOW YELLOW   APPearance CLOUDY (A) CLEAR   Specific Gravity, Urine 1.013 1.005 - 1.030   pH 7.5 5.0 - 8.0   Glucose, UA NEGATIVE NEGATIVE mg/dL   Hgb urine dipstick LARGE (A) NEGATIVE   Bilirubin Urine NEGATIVE NEGATIVE    Ketones, ur NEGATIVE NEGATIVE mg/dL   Protein, ur NEGATIVE NEGATIVE mg/dL   Nitrite NEGATIVE NEGATIVE   Leukocytes, UA TRACE (A) NEGATIVE  Pregnancy, urine     Status: None  Collection Time: 02/27/16  4:26 PM  Result Value Ref Range   Preg Test, Ur NEGATIVE NEGATIVE    Comment:        THE SENSITIVITY OF THIS METHODOLOGY IS >20 mIU/mL.   Urine microscopic-add on     Status: Abnormal   Collection Time: 02/27/16  4:26 PM  Result Value Ref Range   Squamous Epithelial / LPF 6-30 (A) NONE SEEN   WBC, UA 0-5 0 - 5 WBC/hpf   RBC / HPF 0-5 0 - 5 RBC/hpf   Bacteria, UA MANY (A) NONE SEEN  Basic metabolic panel     Status: None   Collection Time: 04/08/16  2:55 PM  Result Value Ref Range   Sodium 138 135 - 145 mmol/L   Potassium 3.5 3.5 - 5.1 mmol/L   Chloride 107 101 - 111 mmol/L   CO2 24 22 - 32 mmol/L   Glucose, Bld 91 65 - 99 mg/dL   BUN 7 6 - 20 mg/dL   Creatinine, Ser 0.88 0.44 - 1.00 mg/dL   Calcium 9.2 8.9 - 10.3 mg/dL   GFR calc non Af Amer >60 >60 mL/min   GFR calc Af Amer >60 >60 mL/min    Comment: (NOTE) The eGFR has been calculated using the CKD EPI equation. This calculation has not been validated in all clinical situations. eGFR's persistently <60 mL/min signify possible Chronic Kidney Disease.    Anion gap 7 5 - 15  CBC     Status: Abnormal   Collection Time: 04/08/16  2:55 PM  Result Value Ref Range   WBC 6.9 4.0 - 10.5 K/uL   RBC 5.01 3.87 - 5.11 MIL/uL   Hemoglobin 12.6 12.0 - 15.0 g/dL   HCT 37.9 36.0 - 46.0 %   MCV 75.6 (L) 78.0 - 100.0 fL   MCH 25.1 (L) 26.0 - 34.0 pg   MCHC 33.2 30.0 - 36.0 g/dL   RDW 16.5 (H) 11.5 - 15.5 %   Platelets 337 150 - 400 K/uL  I-stat troponin, ED     Status: None   Collection Time: 04/08/16  3:11 PM  Result Value Ref Range   Troponin i, poc 0.03 0.00 - 0.08 ng/mL   Comment 3            Comment: Due to the release kinetics of cTnI, a negative result within the first hours of the onset of symptoms does not rule  out myocardial infarction with certainty. If myocardial infarction is still suspected, repeat the test at appropriate intervals.   I-stat troponin, ED     Status: None   Collection Time: 04/08/16 10:52 PM  Result Value Ref Range   Troponin i, poc 0.01 0.00 - 0.08 ng/mL   Comment 3            Comment: Due to the release kinetics of cTnI, a negative result within the first hours of the onset of symptoms does not rule out myocardial infarction with certainty. If myocardial infarction is still suspected, repeat the test at appropriate intervals.   Comp Met (CMET)     Status: None   Collection Time: 04/14/16  4:01 PM  Result Value Ref Range   Sodium 138 135 - 145 mEq/L   Potassium 4.1 3.5 - 5.1 mEq/L   Chloride 104 96 - 112 mEq/L   CO2 30 19 - 32 mEq/L   Glucose, Bld 83 70 - 99 mg/dL   BUN 7 6 - 23 mg/dL   Creatinine, Ser 0.93  0.40 - 1.20 mg/dL   Total Bilirubin 0.4 0.2 - 1.2 mg/dL   Alkaline Phosphatase 70 39 - 117 U/L   AST 17 0 - 37 U/L   ALT 11 0 - 35 U/L   Total Protein 8.0 6.0 - 8.3 g/dL   Albumin 4.1 3.5 - 5.2 g/dL   Calcium 9.7 8.4 - 10.5 mg/dL   GFR 82.31 >60.00 mL/min  H. pylori antibody, IgG     Status: None   Collection Time: 04/14/16  4:01 PM  Result Value Ref Range   H Pylori IgG Negative Negative  Lipase     Status: None   Collection Time: 04/14/16  4:01 PM  Result Value Ref Range   Lipase 37.0 11.0 - 59.0 U/L  CBC     Status: Abnormal   Collection Time: 04/14/16  4:01 PM  Result Value Ref Range   WBC 6.7 4.0 - 10.5 K/uL   RBC 4.91 3.87 - 5.11 Mil/uL   Platelets 382.0 150.0 - 400.0 K/uL   Hemoglobin 12.5 12.0 - 15.0 g/dL   HCT 38.0 36.0 - 46.0 %   MCV 77.4 (L) 78.0 - 100.0 fl   MCHC 33.0 30.0 - 36.0 g/dL   RDW 17.7 (H) 11.5 - 15.5 %    Assessment/Plan: Essential hypertension BP improved on recheck. Home BP normotensive. Will continue current regimen. DASH diet again reiterated to patient. Will review Echo and Stress test results once she has  completed them next week. FU scheduled.  Gastroesophageal reflux disease without esophagitis Marked improvement in symptoms. Continue Protonix. Will continue to work on diet and weight loss to further improve symptoms. FU scheduled.  Generalized anxiety disorder Is tolerating Cymbalta very well. Continue current regimen. Continue Xanax PRN for acute anxiety. FU 2 months.     Leeanne Rio, PA-C

## 2016-05-04 ENCOUNTER — Other Ambulatory Visit (HOSPITAL_COMMUNITY): Payer: BLUE CROSS/BLUE SHIELD

## 2016-05-13 MED FILL — PANTOPRAZOLE SOD DR 40 MG T: 40 | 30 days supply | Qty: 30 | Fill #1

## 2016-05-13 MED FILL — DULoxetine HCL 20 MG CPEP: 20 | 30 days supply | Qty: 30 | Fill #1

## 2016-05-13 MED FILL — HYDROCHLOROTHIAZIDE 25 MG T: 25 | 30 days supply | Qty: 30 | Fill #1

## 2016-05-25 ENCOUNTER — Ambulatory Visit (HOSPITAL_COMMUNITY): Payer: BLUE CROSS/BLUE SHIELD | Attending: Cardiovascular Disease

## 2016-05-25 ENCOUNTER — Other Ambulatory Visit (HOSPITAL_COMMUNITY): Payer: Self-pay

## 2016-05-25 ENCOUNTER — Ambulatory Visit (INDEPENDENT_AMBULATORY_CARE_PROVIDER_SITE_OTHER): Payer: BLUE CROSS/BLUE SHIELD

## 2016-05-25 DIAGNOSIS — R0600 Dyspnea, unspecified: Secondary | ICD-10-CM

## 2016-05-25 DIAGNOSIS — I119 Hypertensive heart disease without heart failure: Secondary | ICD-10-CM | POA: Insufficient documentation

## 2016-05-25 DIAGNOSIS — E785 Hyperlipidemia, unspecified: Secondary | ICD-10-CM | POA: Diagnosis not present

## 2016-05-25 DIAGNOSIS — Z6841 Body Mass Index (BMI) 40.0 and over, adult: Secondary | ICD-10-CM | POA: Diagnosis not present

## 2016-05-25 LAB — EXERCISE TOLERANCE TEST
Estimated workload: 4.6 METS
Exercise duration (min): 3 min
Exercise duration (sec): 0 s
MPHR: 171 {beats}/min
Peak HR: 153 {beats}/min
Percent HR: 89 %
RPE: 17
Rest HR: 89 {beats}/min

## 2016-06-10 ENCOUNTER — Other Ambulatory Visit: Payer: Self-pay | Admitting: Physician Assistant

## 2016-06-10 DIAGNOSIS — I10 Essential (primary) hypertension: Secondary | ICD-10-CM

## 2016-06-10 DIAGNOSIS — F411 Generalized anxiety disorder: Secondary | ICD-10-CM

## 2016-06-10 DIAGNOSIS — K219 Gastro-esophageal reflux disease without esophagitis: Secondary | ICD-10-CM

## 2016-06-11 MED FILL — DULoxetine HCL 20 MG CPEP: 20 | 30 days supply | Qty: 30 | Fill #0

## 2016-06-11 MED FILL — PANTOPRAZOLE SOD DR 40 MG T: 40 | 30 days supply | Qty: 30 | Fill #0

## 2016-06-11 MED FILL — HYDROCHLOROTHIAZIDE 25 MG T: 25 | 30 days supply | Qty: 30 | Fill #0

## 2016-06-14 ENCOUNTER — Telehealth: Payer: Self-pay | Admitting: *Deleted

## 2016-06-14 DIAGNOSIS — F411 Generalized anxiety disorder: Secondary | ICD-10-CM

## 2016-06-14 MED ORDER — ALPRAZOLAM 0.25 MG PO TABS: 0.2500 mg | ORAL_TABLET | Freq: Two times a day (BID) | ORAL | 1 refills | Status: DC | PRN

## 2016-06-14 NOTE — Telephone Encounter (Signed)
Ok to refill same sig and quantity with 1 additional refill. Please verify contract -- if not signed, she will need to come in to pick up Rx and sign contract

## 2016-06-14 NOTE — Telephone Encounter (Signed)
Patient informed she needs to come in to office to p/u Rx this month to sign CSC; [pharmacy also informed Dannielle Karvonen[request being sent], understood & agreed to p/u during regular business hours/SLS 08/21

## 2016-06-14 NOTE — Telephone Encounter (Signed)
Faxed refill request received from [facility] for Alprazolam 0.25mg  Last filled by MD on 04/14/16, #20x0 Last AEX - 04/28/16 Next AEX - 2-Mths. Please Advise on refills/SLS 08/21

## 2016-06-29 ENCOUNTER — Encounter: Payer: Self-pay | Admitting: Physician Assistant

## 2016-06-29 ENCOUNTER — Ambulatory Visit (INDEPENDENT_AMBULATORY_CARE_PROVIDER_SITE_OTHER): Payer: BLUE CROSS/BLUE SHIELD | Admitting: Physician Assistant

## 2016-06-29 VITALS — BP 122/88 | HR 86 | Temp 98.2°F | Resp 16 | Ht 66.0 in | Wt 270.0 lb

## 2016-06-29 DIAGNOSIS — G5603 Carpal tunnel syndrome, bilateral upper limbs: Secondary | ICD-10-CM

## 2016-06-29 DIAGNOSIS — F411 Generalized anxiety disorder: Secondary | ICD-10-CM

## 2016-06-29 DIAGNOSIS — G4700 Insomnia, unspecified: Secondary | ICD-10-CM | POA: Diagnosis not present

## 2016-06-29 DIAGNOSIS — K219 Gastro-esophageal reflux disease without esophagitis: Secondary | ICD-10-CM | POA: Diagnosis not present

## 2016-06-29 DIAGNOSIS — I10 Essential (primary) hypertension: Secondary | ICD-10-CM

## 2016-06-29 DIAGNOSIS — G5600 Carpal tunnel syndrome, unspecified upper limb: Secondary | ICD-10-CM | POA: Insufficient documentation

## 2016-06-29 MED ORDER — DULOXETINE HCL 30 MG PO CPEP: 30.0000 mg | ORAL_CAPSULE | Freq: Every day | ORAL | 1 refills | Status: DC

## 2016-06-29 MED ORDER — RANITIDINE HCL 300 MG PO TABS: 300.0000 mg | ORAL_TABLET | Freq: Every day | ORAL | 1 refills | Status: DC

## 2016-06-29 MED ORDER — TRAZODONE HCL 50 MG PO TABS: 25.0000 mg | ORAL_TABLET | Freq: Every day | ORAL | 1 refills | Status: DC

## 2016-06-29 MED FILL — raNITIdine HCL 300 MG TABS: 300 | 30 days supply | Qty: 30 | Fill #0

## 2016-06-29 MED FILL — traZODone HCL 50 MG TABS: 50 | 90 days supply | Qty: 45 | Fill #0

## 2016-06-29 MED FILL — DULoxetine HCL 30 MG CPEP: 30 | 30 days supply | Qty: 30 | Fill #0

## 2016-06-29 MED FILL — ALPRAZolam 0.25 MG TABS: 0.25 | 10 days supply | Qty: 20 | Fill #0

## 2016-06-29 NOTE — Progress Notes (Signed)
Patient presents to clinic today for follow-up of Hypertension, GERD and Anxiety/Depression.  Hypertension -- Patient currently on HCTZ 25 mg daily. Endorses taking medications as directed. Patient denies chest pain, palpitations, lightheadedness, dizziness, vision changes or frequent headaches.  BP Readings from Last 3 Encounters:  06/29/16 122/88  04/28/16 (!) 138/96  04/14/16 (!) 156/98   GERD -- Patient currently on Protonix 40 mg daily. Endorses taking as directed. Endorses symptoms are improved but sometimes still present, especially in the evenings. Denies abdominal pain. Does note mild throat discomfort sometimes. Is trying to watch diet. Denies melena, hematochezia or tenesmus.   Anxiety/Depression -- Currently on Cymbalta 20 mg. Taking as directed without side effect. Notes some improvement of symptoms but feels she is still with depressed mood and easy to anger. Anxiety is doing better. Denies new stressors at present. Denies SI/HI. Does note difficulty with sleep from time to time.  Patient also c/o intermittent numbness and tingling of 1st two fingers of hands bilaterally with some wrist pain. Does a lot of typing at work. Endorses history of carpal tunnel in the past and states this feels similar. Has not tried anything for symptoms.      Past Medical History:  Diagnosis Date  . AC (acromioclavicular) joint bone spurs   . Acid reflux   . Anxiety   . CTS (carpal tunnel syndrome)    Bilateral  . Gastric ulcer   . GERD (gastroesophageal reflux disease)   . Heel spur   . History of chicken pox   . Hypertension   . Panic attacks   . Plantar fasciitis   . Tendonitis    Feet    Current Outpatient Prescriptions on File Prior to Visit  Medication Sig Dispense Refill  . ALPRAZolam (XANAX) 0.25 MG tablet Take 1 tablet (0.25 mg total) by mouth 2 (two) times daily as needed for anxiety. 20 tablet 1  . hydrochlorothiazide (HYDRODIURIL) 25 MG tablet TAKE 1 TABLET (25 MG  TOTAL) BY MOUTH DAILY. 30 tablet 0  . Multiple Vitamin (MULTIVITAMIN) tablet Take 1 tablet by mouth daily. Reported on 04/28/2016    . pantoprazole (PROTONIX) 40 MG tablet TAKE 1 TABLET (40 MG TOTAL) BY MOUTH DAILY. 30 tablet 0   No current facility-administered medications on file prior to visit.     Allergies  Allergen Reactions  . Amoxicillin Nausea Only  . Benadryl [Diphenhydramine Hcl] Itching  . Oxycodone-Acetaminophen Nausea And Vomiting  . Penicillins Nausea Only    Family History  Problem Relation Age of Onset  . Hypertension Mother     Living  . Hypertension Father 10    Deceased  . Heart attack Father   . Arthritis Mother   . Arthritis Sister     x2  . Diabetes Mother   . Lung cancer Sister     Deceased  . Cancer Other     Paternal & Maternal Aunts  . Dementia Mother   . Heart attack Maternal Grandmother   . Neuropathy Sister   . Heart disease Brother     x1  . Alcoholism Brother     x2    Social History   Social History  . Marital status: Single    Spouse name: N/A  . Number of children: N/A  . Years of education: N/A   Social History Main Topics  . Smoking status: Never Smoker  . Smokeless tobacco: None  . Alcohol use No  . Drug use: No  . Sexual activity: Not  Asked   Other Topics Concern  . None   Social History Narrative  . None   Review of Systems - See HPI.  All other ROS are negative.  BP 122/88 (BP Location: Right Arm, Patient Position: Sitting, Cuff Size: Large)   Pulse 86   Temp 98.2 F (36.8 C) (Oral)   Resp 16   Ht '5\' 6"'  (1.676 m)   Wt 270 lb (122.5 kg)   LMP 06/22/2016   SpO2 99%   BMI 43.58 kg/m   Physical Exam  Constitutional: She is oriented to person, place, and time and well-developed, well-nourished, and in no distress.  HENT:  Head: Normocephalic and atraumatic.  Eyes: Conjunctivae are normal.  Neck: Neck supple.  Cardiovascular: Normal rate, regular rhythm, normal heart sounds and intact distal pulses.     Pulmonary/Chest: Effort normal and breath sounds normal. No respiratory distress. She has no wheezes. She has no rales. She exhibits no tenderness.  Abdominal: Soft. Bowel sounds are normal. She exhibits no distension.  Musculoskeletal:       Right hand: Decreased sensation noted. Decreased sensation is present in the radial distribution.       Left hand: Decreased sensation noted. Decreased sensation is present in the radial distribution.  + Tinel sign bilaterally  Neurological: She is alert and oriented to person, place, and time.  Skin: Skin is warm and dry. No rash noted.  Psychiatric: Affect normal.  Vitals reviewed.   Recent Results (from the past 2160 hour(s))  Basic metabolic panel     Status: None   Collection Time: 04/08/16  2:55 PM  Result Value Ref Range   Sodium 138 135 - 145 mmol/L   Potassium 3.5 3.5 - 5.1 mmol/L   Chloride 107 101 - 111 mmol/L   CO2 24 22 - 32 mmol/L   Glucose, Bld 91 65 - 99 mg/dL   BUN 7 6 - 20 mg/dL   Creatinine, Ser 0.88 0.44 - 1.00 mg/dL   Calcium 9.2 8.9 - 10.3 mg/dL   GFR calc non Af Amer >60 >60 mL/min   GFR calc Af Amer >60 >60 mL/min    Comment: (NOTE) The eGFR has been calculated using the CKD EPI equation. This calculation has not been validated in all clinical situations. eGFR's persistently <60 mL/min signify possible Chronic Kidney Disease.    Anion gap 7 5 - 15  CBC     Status: Abnormal   Collection Time: 04/08/16  2:55 PM  Result Value Ref Range   WBC 6.9 4.0 - 10.5 K/uL   RBC 5.01 3.87 - 5.11 MIL/uL   Hemoglobin 12.6 12.0 - 15.0 g/dL   HCT 37.9 36.0 - 46.0 %   MCV 75.6 (L) 78.0 - 100.0 fL   MCH 25.1 (L) 26.0 - 34.0 pg   MCHC 33.2 30.0 - 36.0 g/dL   RDW 16.5 (H) 11.5 - 15.5 %   Platelets 337 150 - 400 K/uL  I-stat troponin, ED     Status: None   Collection Time: 04/08/16  3:11 PM  Result Value Ref Range   Troponin i, poc 0.03 0.00 - 0.08 ng/mL   Comment 3            Comment: Due to the release kinetics of cTnI, a  negative result within the first hours of the onset of symptoms does not rule out myocardial infarction with certainty. If myocardial infarction is still suspected, repeat the test at appropriate intervals.   I-stat troponin, ED  Status: None   Collection Time: 04/08/16 10:52 PM  Result Value Ref Range   Troponin i, poc 0.01 0.00 - 0.08 ng/mL   Comment 3            Comment: Due to the release kinetics of cTnI, a negative result within the first hours of the onset of symptoms does not rule out myocardial infarction with certainty. If myocardial infarction is still suspected, repeat the test at appropriate intervals.   Comp Met (CMET)     Status: None   Collection Time: 04/14/16  4:01 PM  Result Value Ref Range   Sodium 138 135 - 145 mEq/L   Potassium 4.1 3.5 - 5.1 mEq/L   Chloride 104 96 - 112 mEq/L   CO2 30 19 - 32 mEq/L   Glucose, Bld 83 70 - 99 mg/dL   BUN 7 6 - 23 mg/dL   Creatinine, Ser 0.93 0.40 - 1.20 mg/dL   Total Bilirubin 0.4 0.2 - 1.2 mg/dL   Alkaline Phosphatase 70 39 - 117 U/L   AST 17 0 - 37 U/L   ALT 11 0 - 35 U/L   Total Protein 8.0 6.0 - 8.3 g/dL   Albumin 4.1 3.5 - 5.2 g/dL   Calcium 9.7 8.4 - 10.5 mg/dL   GFR 82.31 >60.00 mL/min  H. pylori antibody, IgG     Status: None   Collection Time: 04/14/16  4:01 PM  Result Value Ref Range   H Pylori IgG Negative Negative  Lipase     Status: None   Collection Time: 04/14/16  4:01 PM  Result Value Ref Range   Lipase 37.0 11.0 - 59.0 U/L  CBC     Status: Abnormal   Collection Time: 04/14/16  4:01 PM  Result Value Ref Range   WBC 6.7 4.0 - 10.5 K/uL   RBC 4.91 3.87 - 5.11 Mil/uL   Platelets 382.0 150.0 - 400.0 K/uL   Hemoglobin 12.5 12.0 - 15.0 g/dL   HCT 38.0 36.0 - 46.0 %   MCV 77.4 (L) 78.0 - 100.0 fl   MCHC 33.0 30.0 - 36.0 g/dL   RDW 17.7 (H) 11.5 - 15.5 %  Exercise Tolerance Test     Status: None   Collection Time: 05/25/16  2:33 PM  Result Value Ref Range   Rest HR 89 bpm   Rest BP 129/90 mmHg    RPE 17    Exercise duration (sec) 0 sec   Percent HR 89 %   Exercise duration (min) 3 min   Estimated workload 4.6 METS   Peak HR 153 bpm   Peak BP 246/83 mmHg   MPHR 171 bpm    Assessment/Plan: Insomnia Will attempt trial of Trazodone 25 mg nightly for sleep. Sleep hygiene practices reviewed. FU scheduled.  Generalized anxiety disorder Will increase Cymbalta to 30 mg. Trazodone in the evening. FU scheduled.   Gastroesophageal reflux disease without esophagitis Continue PPI and GERD diet. Will add on nightly Ranitidine and daily probiotic. Referral to GI placed.  Essential hypertension BP stable. Asymptomatic. Continue current regimen. Will repeat labs at FU in 1 month for her other chronic conditions.  Carpal tunnel syndrome Braces given and applied x 2. Wear at night. Supportive measures reviewed. OTC medications discussed. If not resolving, recommend assessment by Hand surgery.    Leeanne Rio, PA-C

## 2016-06-29 NOTE — Patient Instructions (Signed)
Please start the new dose of Cymbalta. Take Trazodone each evening as directed.  Continue BP medications.  Your BP looks good.  WEar your braces at night for the carpal tunnel. If not improving symptoms, we will need to get a hand specialist on board.  Return to the lab for blood work. I will call with your results.  Continue the Protonix as directed. Start a Ranitidine 300 mg each evening.  You will be contacted for assessment by Gastroenterology.

## 2016-07-01 ENCOUNTER — Other Ambulatory Visit: Payer: Self-pay

## 2016-07-02 DIAGNOSIS — G4700 Insomnia, unspecified: Secondary | ICD-10-CM | POA: Insufficient documentation

## 2016-07-02 NOTE — Assessment & Plan Note (Signed)
Will attempt trial of Trazodone 25 mg nightly for sleep. Sleep hygiene practices reviewed. FU scheduled.

## 2016-07-02 NOTE — Assessment & Plan Note (Signed)
Will increase Cymbalta to 30 mg. Trazodone in the evening. FU scheduled.

## 2016-07-02 NOTE — Assessment & Plan Note (Signed)
BP stable. Asymptomatic. Continue current regimen. Will repeat labs at FU in 1 month for her other chronic conditions.

## 2016-07-02 NOTE — Assessment & Plan Note (Signed)
Continue PPI and GERD diet. Will add on nightly Ranitidine and daily probiotic. Referral to GI placed.

## 2016-07-02 NOTE — Assessment & Plan Note (Signed)
Braces given and applied x 2. Wear at night. Supportive measures reviewed. OTC medications discussed. If not resolving, recommend assessment by Hand surgery.

## 2016-07-03 IMAGING — CR DG CHEST 2V
2 series · 2 of 2 positions shown · non-contrast
Comparison: Chest x-ray dated 02/26/2015.

CLINICAL DATA: Chest pain, shortness of breath and hypertension for
4-5 weeks.

EXAM:
CHEST  2 VIEW

[chest pa]
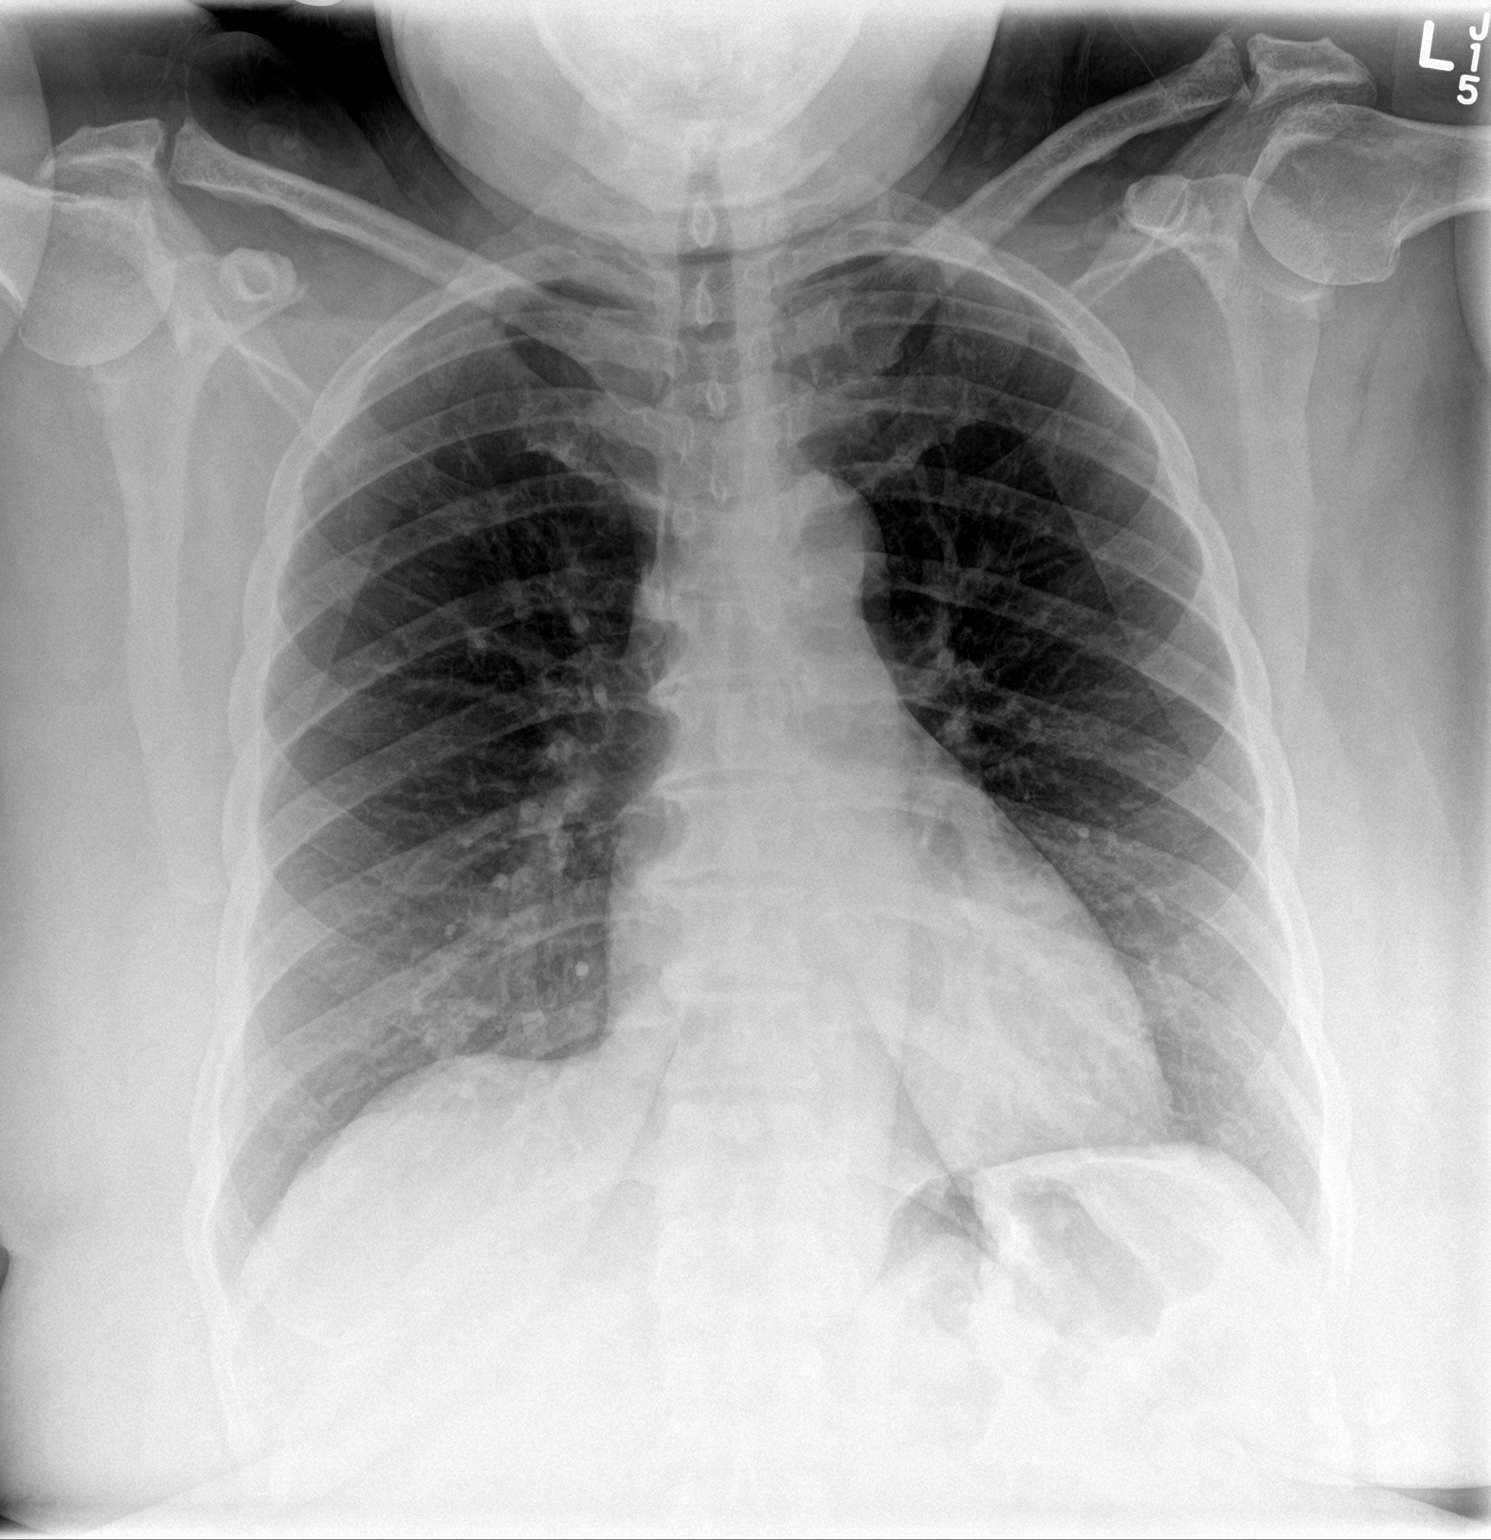

[chest lat]
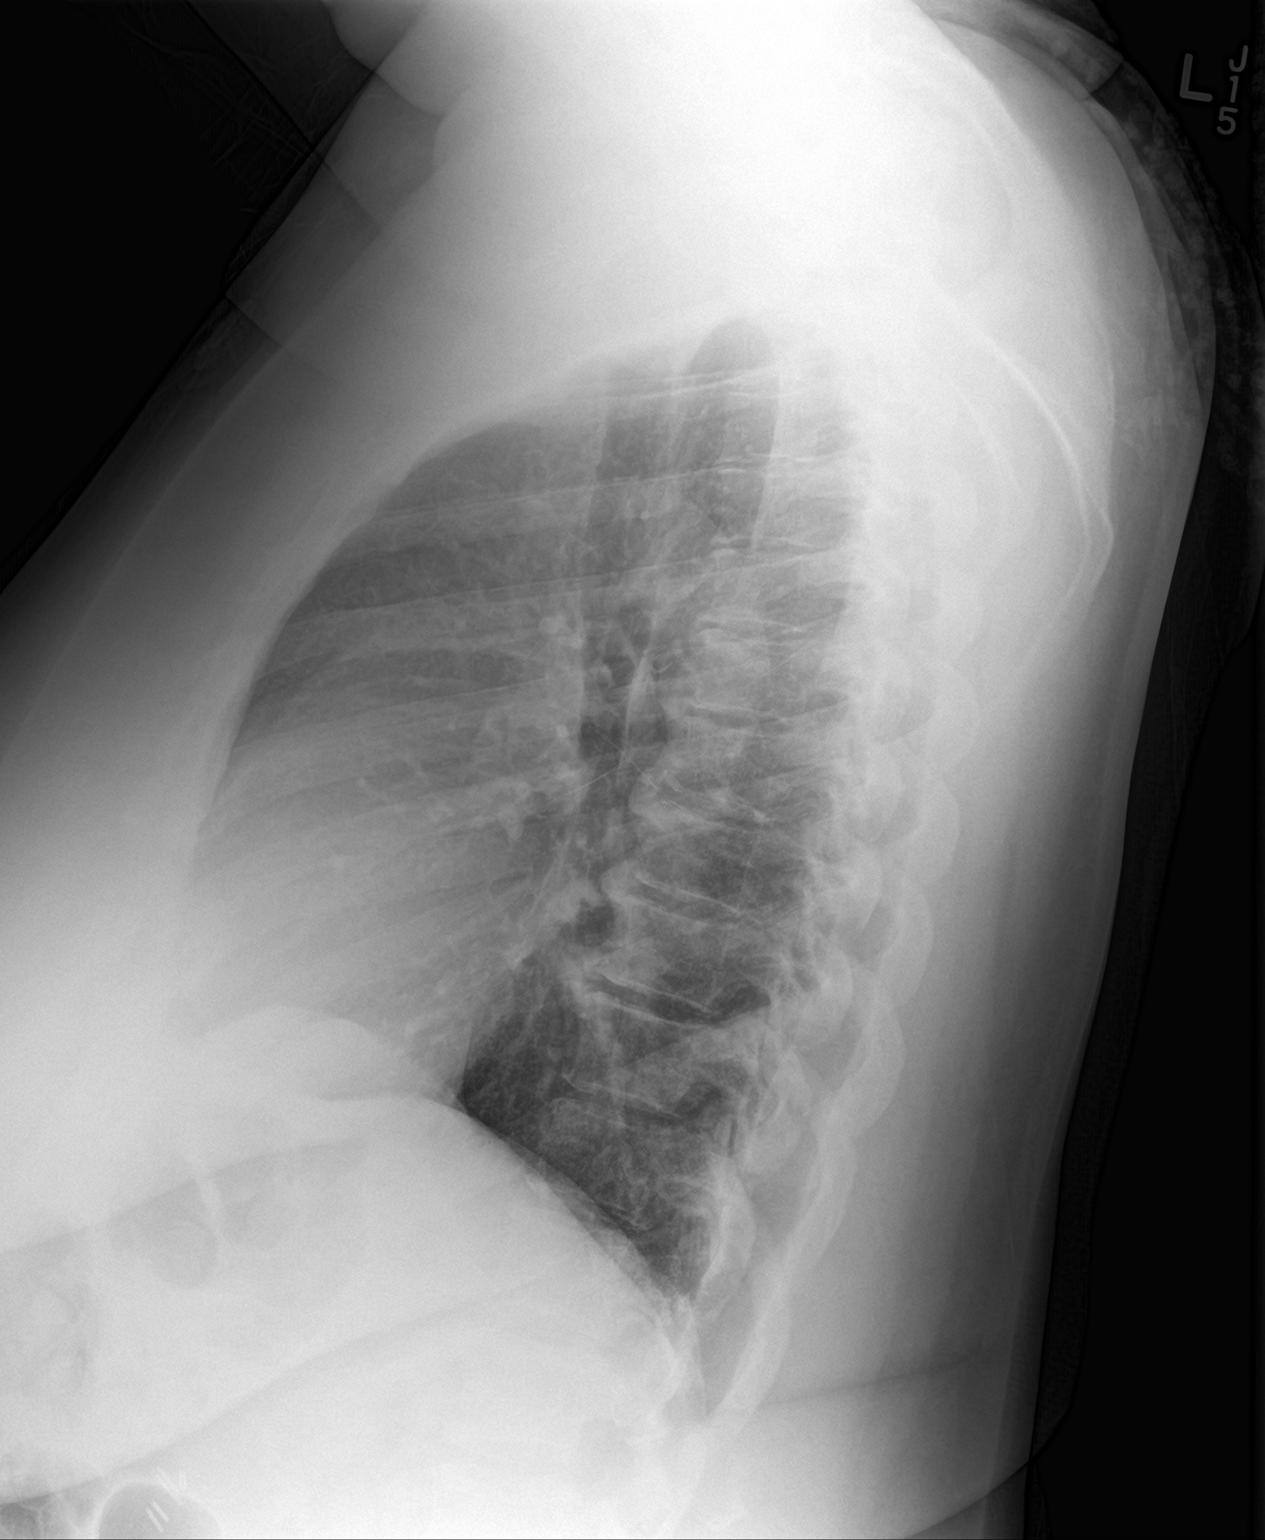

[2 of 2 positions shown; findings below may reference images not displayed]

FINDINGS: Heart size is upper normal. Overall cardiomediastinal silhouette is
stable in size and configuration. Lungs are clear. No evidence of
pneumonia. No pleural effusion or pneumothorax seen.

Mild degenerative spurring noted within the thoracic spine. No acute
appearing osseous abnormality.
IMPRESSION: No active cardiopulmonary disease.  No evidence of pneumonia.

## 2016-07-07 ENCOUNTER — Other Ambulatory Visit: Payer: Self-pay

## 2016-07-13 ENCOUNTER — Other Ambulatory Visit: Payer: Self-pay | Admitting: Physician Assistant

## 2016-07-13 DIAGNOSIS — I10 Essential (primary) hypertension: Secondary | ICD-10-CM

## 2016-07-13 DIAGNOSIS — K219 Gastro-esophageal reflux disease without esophagitis: Secondary | ICD-10-CM

## 2016-07-13 MED FILL — HYDROCHLOROTHIAZIDE 25 MG T: 25 | 30 days supply | Qty: 30 | Fill #0

## 2016-07-13 MED FILL — PANTOPRAZOLE SOD DR 40 MG T: 40 | 30 days supply | Qty: 30 | Fill #0 | Status: TO

## 2016-07-13 NOTE — Telephone Encounter (Signed)
Rx request to pharmacy/SLS  

## 2016-07-14 ENCOUNTER — Other Ambulatory Visit (INDEPENDENT_AMBULATORY_CARE_PROVIDER_SITE_OTHER): Payer: BLUE CROSS/BLUE SHIELD

## 2016-07-14 DIAGNOSIS — G609 Hereditary and idiopathic neuropathy, unspecified: Secondary | ICD-10-CM

## 2016-07-15 LAB — BASIC METABOLIC PANEL
BUN: 6 mg/dL (ref 6–23)
CALCIUM: 9.3 mg/dL (ref 8.4–10.5)
CO2: 33 meq/L — AB (ref 19–32)
CREATININE: 0.98 mg/dL (ref 0.40–1.20)
Chloride: 102 mEq/L (ref 96–112)
GFR: 77.41 mL/min (ref >60.00)
GLUCOSE: 88 mg/dL (ref 70–99)
Potassium: 3.7 mEq/L (ref 3.5–5.1)
Sodium: 142 mEq/L (ref 135–145)

## 2016-07-15 LAB — VITAMIN B12: Vitamin B-12: 1250 pg/mL — ABNORMAL HIGH (ref 211–911)

## 2016-07-19 ENCOUNTER — Telehealth: Payer: Self-pay | Admitting: Physician Assistant

## 2016-07-19 NOTE — Telephone Encounter (Signed)
Relation to ZO:XWRUpt:self Call back number:[phone]   Reason for call:  Patient inquiring about lab results, please advise

## 2016-07-19 NOTE — Telephone Encounter (Signed)
Amy MerlWilliam C Martin, PA-C sent to Amy BillSharon L Natara Perez, CMA        Labs good overall. Her Vitamin B12 level was elevated. Is she taking OTC B12 supplement or B complex multivitamin? If not, would like to recheck this level in a couple of weeks to make sure it is normal.     LMOM with contact name and number for return call RE: is patient taking an OTC Vitamin B and/or Vitamin B complex and further provider instructions/SLS 09/25

## 2016-07-19 NOTE — Telephone Encounter (Signed)
Copy & Pasted to Results note.

## 2016-08-10 MED FILL — DULoxetine HCL 30 MG CPEP: 30 | 30 days supply | Qty: 30 | Fill #1

## 2016-08-10 MED FILL — HYDROCHLOROTHIAZIDE 25 MG T: 25 | 30 days supply | Qty: 30 | Fill #1

## 2016-08-10 MED FILL — PANTOPRAZOLE SOD DR 40 MG T: 40 | 30 days supply | Qty: 30 | Fill #1 | Status: TO

## 2016-08-10 MED FILL — raNITIdine HCL 300 MG TABS: 300 | 30 days supply | Qty: 30 | Fill #1

## 2016-08-10 MED FILL — ALPRAZolam 0.25 MG TABS: 0.25 | 10 days supply | Qty: 20 | Fill #1

## 2016-08-20 ENCOUNTER — Telehealth: Payer: Self-pay | Admitting: Physician Assistant

## 2016-08-20 DIAGNOSIS — R748 Abnormal levels of other serum enzymes: Secondary | ICD-10-CM

## 2016-08-20 NOTE — Telephone Encounter (Signed)
Would have her recheck in 1.5-2 weeks

## 2016-08-20 NOTE — Telephone Encounter (Signed)
Orders in..     KP 

## 2016-08-20 NOTE — Telephone Encounter (Signed)
Patient scheduled an appt for labs on 09/01/16, there aren't any orders in yet. FYI

## 2016-08-20 NOTE — Telephone Encounter (Signed)
°  Relationship to patient: Self Can be reached: [phone]    Reason for call: Patient needs to know when provider would like for her to get her B12 level drawn

## 2016-08-20 NOTE — Telephone Encounter (Signed)
LMOM with contact name and number RE: lab timeframe per provider instructions/SLS 10/27

## 2016-08-20 NOTE — Addendum Note (Signed)
Addended by: Arnette NorrisPAYNE, Tyresse Jayson P on: 08/20/2016 03:24 PM   Modules accepted: Orders

## 2016-08-30 ENCOUNTER — Telehealth: Payer: Self-pay | Admitting: Physician Assistant

## 2016-08-30 NOTE — Telephone Encounter (Signed)
Pt declined going to ER, but has agreed to go to Urgent Care.

## 2016-08-30 NOTE — Telephone Encounter (Signed)
Patient called stating that she is unable to use her hand. When I asked if something happened to it she stated "it just went like that" Patient sent to [facility]. FYI

## 2016-08-30 NOTE — Telephone Encounter (Signed)
Patient Name: Amy RuffiniMICHELLE Perez  DOB: Mar 23, 1966    Initial Comment Caller states she suddenly cannot use hand.   Nurse Assessment  Nurse: Stefano GaulStringer, RN, Dwana CurdVera Date/Time (Eastern Time): 08/30/2016 11:45:20 AM  Confirm and document reason for call. If symptomatic, describe symptoms. You must click the next button to save text entered. ---Caller states she had tendonitis. Has numbness in both hands. She is using her braces. Today she has a lot of pain when she tries to use her right hand. Could not even pick up a spoon.  Has the patient traveled out of the country within the last 30 days? ---Not Applicable  Does the patient have any new or worsening symptoms? ---Yes  Will a triage be completed? ---Yes  Related visit to physician within the last 2 weeks? ---No  Does the PT have any chronic conditions? (i.e. diabetes, asthma, etc.) ---No  Is the patient pregnant or possibly pregnant? (Ask all females between the ages of 3212-55) ---No  Is this a behavioral health or substance abuse call? ---No     Guidelines    Guideline Title Affirmed Question Affirmed Notes  Neurologic Deficit [1] Weakness of the face, arm / hand, or leg / foot on one side of the body AND [2] gradual onset (e.g., days to weeks) AND [3] present now    Final Disposition User   See Physician within 4 Hours (or PCP triage) Stefano GaulStringer, RN, Vera    Comments  no appts available within 4 hrs at [place]. Pt does not want to go to another office. She does not want to go to urgent care or ER at this time but states she will go if it gets worse.   Referrals  GO TO FACILITY REFUSED   Disagree/Comply: Disagree  Disagree/Comply Reason: Disagree with instructions

## 2016-08-30 NOTE — Telephone Encounter (Signed)
Noted. Message routed to PCP for FYI.  

## 2016-08-30 NOTE — Telephone Encounter (Signed)
Would highly encourage ER assessment if she is unable to move her hand. That is a very concerning finding.

## 2016-09-01 ENCOUNTER — Other Ambulatory Visit: Payer: Self-pay

## 2016-09-13 ENCOUNTER — Telehealth: Payer: Self-pay | Admitting: Physician Assistant

## 2016-09-13 DIAGNOSIS — F411 Generalized anxiety disorder: Secondary | ICD-10-CM

## 2016-09-13 DIAGNOSIS — I10 Essential (primary) hypertension: Secondary | ICD-10-CM

## 2016-09-13 DIAGNOSIS — K219 Gastro-esophageal reflux disease without esophagitis: Secondary | ICD-10-CM

## 2016-09-13 MED ORDER — RANITIDINE HCL 300 MG PO TABS: 300.0000 mg | ORAL_TABLET | Freq: Every day | ORAL | 0 refills | Status: DC

## 2016-09-13 MED ORDER — PANTOPRAZOLE SODIUM 40 MG PO TBEC: 40.0000 mg | DELAYED_RELEASE_TABLET | Freq: Every day | ORAL | 0 refills | Status: DC

## 2016-09-13 MED ORDER — HYDROCHLOROTHIAZIDE 25 MG PO TABS: 25.0000 mg | ORAL_TABLET | Freq: Every day | ORAL | 0 refills | Status: DC

## 2016-09-13 MED ORDER — DULOXETINE HCL 30 MG PO CPEP: 30.0000 mg | ORAL_CAPSULE | Freq: Every day | ORAL | 0 refills | Status: DC

## 2016-09-13 MED FILL — raNITIdine HCL 300 MG TABS: 300 | 30 days supply | Qty: 30 | Fill #0

## 2016-09-13 MED FILL — HYDROCHLOROTHIAZIDE 25 MG T: 25 | 90 days supply | Qty: 90 | Fill #0

## 2016-09-13 MED FILL — DULoxetine HCL 30 MG CPEP: 30 | 90 days supply | Qty: 90 | Fill #0

## 2016-09-13 MED FILL — PANTOPRAZOLE SOD DR 40 MG T: 40 | 30 days supply | Qty: 30 | Fill #0

## 2016-09-13 NOTE — Telephone Encounter (Signed)
Discussed with Selena BattenCody and refilled the Cymbalta, HCTZ, Ranitidine and Pantoprazole.

## 2016-09-13 NOTE — Telephone Encounter (Signed)
Pt states that she needs a refill on all Rx and asking that they be for 90 day supply sent to [facility].

## 2016-09-22 ENCOUNTER — Ambulatory Visit (INDEPENDENT_AMBULATORY_CARE_PROVIDER_SITE_OTHER): Payer: BLUE CROSS/BLUE SHIELD | Admitting: Gastroenterology

## 2016-09-22 ENCOUNTER — Encounter (INDEPENDENT_AMBULATORY_CARE_PROVIDER_SITE_OTHER): Payer: Self-pay

## 2016-09-22 ENCOUNTER — Encounter: Payer: Self-pay | Admitting: Gastroenterology

## 2016-09-22 VITALS — BP 126/84 | HR 98 | Ht 66.0 in | Wt 265.0 lb

## 2016-09-22 DIAGNOSIS — K5900 Constipation, unspecified: Secondary | ICD-10-CM

## 2016-09-22 DIAGNOSIS — K219 Gastro-esophageal reflux disease without esophagitis: Secondary | ICD-10-CM | POA: Diagnosis not present

## 2016-09-22 DIAGNOSIS — R0789 Other chest pain: Secondary | ICD-10-CM

## 2016-09-22 DIAGNOSIS — R140 Abdominal distension (gaseous): Secondary | ICD-10-CM | POA: Diagnosis not present

## 2016-09-22 MED ORDER — NA SULFATE-K SULFATE-MG SULF 17.5-3.13-1.6 GM/177ML PO SOLN: 1.0000 | Freq: Once | ORAL | 0 refills | Status: AC

## 2016-09-22 NOTE — Progress Notes (Signed)
[facility] Consult Note:  History: Amy Perez 09/22/2016  Referring physician: Leeanne Rio, PA-C  Reason for consult/chief complaint: Gastroesophageal Reflux (still having minimal indigestion after eating since taking ranitidine  rx'd by PCP, still having heavy gas, notices gas increase after consuming dairy products)   Subjective  HPI:  This is a 50 year old woman referred by primary care for a constellation of GI symptoms. For the last 2 or 3 years she has complained of postprandial chest pain belching bloating regurgitation abdominal bloating  flatulence and constipation. Sometimes even water will cause heartburn. She denies rectal bleeding. She does note these symptoms are much worse with offending foods such as fried or fatty foods.   ROS:  Review of Systems  Constitutional: Negative for appetite change and unexpected weight change.  HENT: Negative for mouth sores and voice change.   Eyes: Negative for pain and redness.  Respiratory: Negative for cough and shortness of breath.   Cardiovascular: Negative for chest pain and palpitations.  Genitourinary: Negative for dysuria and hematuria.  Musculoskeletal: Negative for arthralgias and myalgias.  Skin: Negative for pallor and rash.  Neurological: Negative for weakness and headaches.  Hematological: Negative for adenopathy.  Psychiatric/Behavioral: Positive for dysphoric mood. The patient is nervous/anxious.      Past Medical History: Past Medical History:  Diagnosis Date  . AC (acromioclavicular) joint bone spurs   . Acid reflux   . Anxiety   . CTS (carpal tunnel syndrome)    Bilateral  . Gastric ulcer   . GERD (gastroesophageal reflux disease)   . Heel spur   . History of chicken pox   . Hypertension   . Panic attacks   . Plantar fasciitis   . Tendonitis    Feet     Past Surgical History: Past Surgical History:  Procedure Laterality Date  . CHOLECYSTECTOMY    . TUBAL LIGATION   08/29/04  . WISDOM TOOTH EXTRACTION       Family History: Family History  Problem Relation Age of Onset  . Hypertension Father 46    Deceased  . Heart attack Father   . Hypertension Mother     Living  . Arthritis Mother   . Diabetes Mother   . Dementia Mother   . Arthritis Sister     x2  . Lung cancer Sister     Deceased  . Cancer Other     Paternal & Maternal Aunts  . Heart attack Maternal Grandmother   . Neuropathy Sister   . Heart disease Brother     x1  . Alcoholism Brother     x2    Social History: Social History   Social History  . Marital status: Single    Spouse name: N/A  . Number of children: N/A  . Years of education: N/A   Social History Main Topics  . Smoking status: Never Smoker  . Smokeless tobacco: Never Used  . Alcohol use No  . Drug use: No  . Sexual activity: Not Asked   Other Topics Concern  . None   Social History Narrative  . None    Allergies: Allergies  Allergen Reactions  . Amoxicillin Nausea Only  . Benadryl [Diphenhydramine Hcl] Itching  . Milk-Related Compounds   . Oxycodone-Acetaminophen Nausea And Vomiting  . Penicillins Nausea Only    Outpatient Meds: Current Outpatient Prescriptions  Medication Sig Dispense Refill  . ALPRAZolam (XANAX) 0.25 MG tablet Take 1 tablet (0.25 mg total) by mouth 2 (two) times daily as  needed for anxiety. 20 tablet 1  . DULoxetine (CYMBALTA) 30 MG capsule Take 1 capsule (30 mg total) by mouth daily. 90 capsule 0  . hydrochlorothiazide (HYDRODIURIL) 25 MG tablet Take 1 tablet (25 mg total) by mouth daily. 90 tablet 0  . Multiple Vitamin (MULTIVITAMIN) tablet Take 1 tablet by mouth daily. Reported on 04/28/2016    . pantoprazole (PROTONIX) 40 MG tablet Take 1 tablet (40 mg total) by mouth daily. 30 tablet 0  . ranitidine (ZANTAC) 300 MG tablet Take 1 tablet (300 mg total) by mouth at bedtime. 30 tablet 0  . Na Sulfate-K Sulfate-Mg Sulf 17.5-3.13-1.6 GM/180ML SOLN Take 1 kit by mouth once. 354  mL 0  . traZODone (DESYREL) 50 MG tablet Take 0.5 tablets (25 mg total) by mouth at bedtime. (Patient not taking: Reported on 09/22/2016) 45 tablet 1   No current facility-administered medications for this visit.       ___________________________________________________________________ Objective   Exam:  BP 126/84 (BP Location: Left Arm, Patient Position: Sitting, Cuff Size: Large)   Pulse 98   Ht 5' 6" (1.676 m)   Wt 265 lb (120.2 kg)   SpO2 96%   BMI 42.77 kg/m    General: this is a(n) Morbidly obese middle-aged woman   Eyes: sclera anicteric, no redness  ENT: oral mucosa moist without lesions, no cervical or supraclavicular lymphadenopathy, good dentition  CV: RRR without murmur, S1/S2, no JVD, no peripheral edema  Resp: clear to auscultation bilaterally, normal RR and effort noted  GI: soft, no tenderness, with active bowel sounds. No guarding or palpable organomegaly noted.  Skin; warm and dry, no rash or jaundice noted  Neuro: awake, alert and oriented x 3. Normal gross motor function and fluent speech  Labs: H pylori IgG negative  CMP Latest Ref Rng & Units 07/14/2016 04/14/2016 04/08/2016  Glucose 70 - 99 mg/dL 88 83 91  BUN 6 - 23 mg/dL 6 7 7  Creatinine 0.40 - 1.20 mg/dL 0.98 0.93 0.88  Sodium 135 - 145 mEq/L 142 138 138  Potassium 3.5 - 5.1 mEq/L 3.7 4.1 3.5  Chloride 96 - 112 mEq/L 102 104 107  CO2 19 - 32 mEq/L 33(H) 30 24  Calcium 8.4 - 10.5 mg/dL 9.3 9.7 9.2  Total Protein 6.0 - 8.3 g/dL - 8.0 -  Total Bilirubin 0.2 - 1.2 mg/dL - 0.4 -  Alkaline Phos 39 - 117 U/L - 70 -  AST 0 - 37 U/L - 17 -  ALT 0 - 35 U/L - 11 -   CBC Latest Ref Rng & Units 04/14/2016 04/08/2016 02/27/2016  WBC 4.0 - 10.5 K/uL 6.7 6.9 7.8  Hemoglobin 12.0 - 15.0 g/dL 12.5 12.6 12.8  Hematocrit 36.0 - 46.0 % 38.0 37.9 38.3  Platelets 150.0 - 400.0 K/uL 382.0 337 322   Cardiac stress test negative  Cardiac echo normal  Assessment: Encounter Diagnoses  Name Primary?  .  Gastroesophageal reflux disease, esophagitis presence not specified Yes  . Other chest pain   . Abdominal bloating   . Constipation, unspecified constipation type     Overall, this sounds most likely functional and exacerbated by poor diet and lifestyle choices.  Plan: Written dietary advice and conservative treatment for constipation given. EGD and colonoscopy   Thank you for the courtesy of this consult.  Please call me with any questions or concerns.  Henry L Danis III  CC: Martin, William Cody, PA-C  

## 2016-09-22 NOTE — Patient Instructions (Addendum)
If you are age 49 or older, your body mass index should be between 23-30. Your Body mass index is 42.77 kg/m. If this is out of the aforementioned range listed, please consider follow up with your Primary Care Provider.  If you are age 49 or younger, your body mass index should be between 19-25. Your Body mass index is 42.77 kg/m. If this is out of the aformentioned range listed, please consider follow up with your Primary Care Provider.   You have been scheduled for a colonoscopy. Please follow written instructions given to you at your visit today.  Please pick up your prep supplies at the pharmacy within the next 1-3 days. If you use inhalers (even only as needed), please bring them with you on the day of your procedure. Your physician has requested that you go to www.startemmi.com and enter the access code given to you at your visit today. This web site gives a general overview about your procedure. However, you should still follow specific instructions given to you by our office regarding your preparation for the procedure.  Constipation:  Increase your intake of water, fruits/vegetables and your activity level.  Ducosate stool softener, 100 mg twice daily. If not improved in 1 week after starting that, please add:  Miralax powder  1 capful in a glass of water or juice once daily.   Food Guidelines for a sensitive stomach  Many people have difficulty digesting certain foods, causing a variety of distressing and embarrassing symptoms such as abdominal pain, bloating and gas.  These foods may need to be avoided or consumed in small amounts.  Here are some tips that might be helpful for you.  1.   Lactose intolerance is the difficulty or complete inability to digest lactose, the natural sugar in milk and anything made from milk.  This condition is harmless, common, and can begin any time during life.  Some people can digest a modest amount of lactose while others cannot tolerate any.  Also,  not all dairy products contain equal amounts of lactose.  For example, hard cheeses such as parmesan have less lactose than soft cheeses such as cheddar.  Yogurt has less lactose than milk or cheese.  Many packaged foods (even many brands of bread) have milk, so read ingredient lists carefully.  It is difficult to test for lactose intolerance, so just try avoiding lactose as much as possible for a week and see what happens with your symptoms.  If you seem to be lactose intolerant, the best plan is to avoid it (but make sure you get calcium from another source).  The next best thing is to use lactase enzyme supplements, available over the counter everywhere.  Just know that many lactose intolerant people need to take several tablets with each serving of dairy to avoid symptoms.  Lastly, a lot of restaurant food is made with milk or butter.  Many are things you might not suspect, such as mashed potatoes, rice and pasta (cooked with butter) and "grilled" items.  If you are lactose intolerant, it never hurts to ask your server what has milk or butter.  2.   Fiber is an important part of your diet, but not all fiber is well-tolerated.  Insoluble fiber such as bran is often consumed by normal gut bacteria and converted into gas.  Soluble fiber such as oats, squash, carrots and green beans are typically tolerated better.  3.   Some types of carbohydrates can be poorly digested.  Examples include:  fructose (apples, cherries, pears, raisins and other dried fruits), fructans (onions, zucchini, large amounts of wheat), sorbitol/mannitol/xylitol and sucralose/Splenda (common artificial sweeteners), and raffinose (lentils, broccoli, cabbage, asparagus, brussel sprouts, many types of beans).  Do a Programmer, multimediaweb search for [place]ODMAP diet and you will find helpful information. Beano, a dietary supplement, will often help with raffinose-containing foods.  As with lactase tablets, you may need several per serving.  4.   Whenever possible,  avoid processed food&meats and chemical additives.  High fructose corn syrup, a common sweetener, may be difficult to digest.  Eggs and soy (comes from the soybean, and added to many foods now) are the other most common bloating/gassy foods.  - Dr. Sherlynn CarbonHenry Danis [facility]

## 2016-09-24 ENCOUNTER — Telehealth: Payer: Self-pay | Admitting: Gastroenterology

## 2016-09-27 ENCOUNTER — Encounter: Payer: Self-pay | Admitting: Gastroenterology

## 2016-09-27 NOTE — Telephone Encounter (Signed)
A pay no more than 50 dollar coupon was faxed to pts pharmacy.

## 2016-10-04 ENCOUNTER — Encounter: Payer: Self-pay | Admitting: Gastroenterology

## 2016-10-20 ENCOUNTER — Other Ambulatory Visit: Payer: Self-pay | Admitting: General Practice

## 2016-10-20 ENCOUNTER — Telehealth: Payer: Self-pay | Admitting: Gastroenterology

## 2016-10-20 ENCOUNTER — Telehealth: Payer: Self-pay | Admitting: Physician Assistant

## 2016-10-20 DIAGNOSIS — K219 Gastro-esophageal reflux disease without esophagitis: Secondary | ICD-10-CM

## 2016-10-20 MED ORDER — PANTOPRAZOLE SODIUM 40 MG PO TBEC: 40.0000 mg | DELAYED_RELEASE_TABLET | Freq: Every day | ORAL | 0 refills | Status: DC

## 2016-10-20 MED FILL — PANTOPRAZOLE SOD DR 40 MG T: 40 | 30 days supply | Qty: 30 | Fill #0

## 2016-10-20 NOTE — Telephone Encounter (Signed)
Medication filled to pharmacy as requested.   

## 2016-10-20 NOTE — Telephone Encounter (Signed)
Patient is requesting a refill of her pantoprazole until she sees cody next week. States that she is totally out. Advised that I would ask you, but uncertain if you could or not.

## 2016-10-20 NOTE — Telephone Encounter (Signed)
Dr Myrtie Neitheranis, Are you ok with sending in Rx for Pantoprazole. Pt seen you for GERD 08/2016?

## 2016-10-20 NOTE — Telephone Encounter (Signed)
[facility] today.  If not, a script for 14 day supply in my name is fine.

## 2016-10-26 ENCOUNTER — Ambulatory Visit (INDEPENDENT_AMBULATORY_CARE_PROVIDER_SITE_OTHER): Payer: BLUE CROSS/BLUE SHIELD | Admitting: Physician Assistant

## 2016-10-26 DIAGNOSIS — Z0289 Encounter for other administrative examinations: Secondary | ICD-10-CM

## 2016-11-03 ENCOUNTER — Encounter: Payer: Self-pay | Admitting: Physician Assistant

## 2016-11-03 ENCOUNTER — Ambulatory Visit (INDEPENDENT_AMBULATORY_CARE_PROVIDER_SITE_OTHER): Payer: PRIVATE HEALTH INSURANCE | Admitting: Physician Assistant

## 2016-11-03 VITALS — BP 130/82 | HR 101 | Temp 98.5°F | Resp 16 | Ht 66.0 in | Wt 268.0 lb

## 2016-11-03 DIAGNOSIS — J019 Acute sinusitis, unspecified: Secondary | ICD-10-CM

## 2016-11-03 DIAGNOSIS — K219 Gastro-esophageal reflux disease without esophagitis: Secondary | ICD-10-CM

## 2016-11-03 DIAGNOSIS — M545 Low back pain, unspecified: Secondary | ICD-10-CM

## 2016-11-03 DIAGNOSIS — F411 Generalized anxiety disorder: Secondary | ICD-10-CM | POA: Diagnosis not present

## 2016-11-03 DIAGNOSIS — B9689 Other specified bacterial agents as the cause of diseases classified elsewhere: Secondary | ICD-10-CM

## 2016-11-03 DIAGNOSIS — I10 Essential (primary) hypertension: Secondary | ICD-10-CM

## 2016-11-03 LAB — COMPREHENSIVE METABOLIC PANEL
ALBUMIN: 3.9 g/dL (ref 3.5–5.2)
ALK PHOS: 72 U/L (ref 39–117)
ALT: 34 U/L (ref 0–35)
AST: 32 U/L (ref 0–37)
BILIRUBIN TOTAL: 0.4 mg/dL (ref 0.2–1.2)
BUN: 8 mg/dL (ref 6–23)
CO2: 32 mEq/L (ref 19–32)
Calcium: 9.6 mg/dL (ref 8.4–10.5)
Chloride: 102 mEq/L (ref 96–112)
Creatinine, Ser: 0.89 mg/dL (ref 0.40–1.20)
GFR: 86.4 mL/min (ref >60.00)
Glucose, Bld: 108 mg/dL — ABNORMAL HIGH (ref 70–99)
POTASSIUM: 3.8 meq/L (ref 3.5–5.1)
SODIUM: 141 meq/L (ref 135–145)
TOTAL PROTEIN: 7.5 g/dL (ref 6.0–8.3)

## 2016-11-03 LAB — VITAMIN B12: Vitamin B-12: >1500 pg/mL — ABNORMAL HIGH (ref 211–911)

## 2016-11-03 MED ORDER — CYCLOBENZAPRINE HCL 10 MG PO TABS: 10.0000 mg | ORAL_TABLET | Freq: Every day | ORAL | 0 refills | Status: DC

## 2016-11-03 MED ORDER — DOXYCYCLINE HYCLATE 100 MG PO CAPS: 100.0000 mg | ORAL_CAPSULE | Freq: Two times a day (BID) | ORAL | 0 refills | Status: DC

## 2016-11-03 NOTE — Patient Instructions (Signed)
Please go to the lab for blood work. I will call with your results.  Please continue chronic medications.  Avoid heavy lifting or overexertion.  Use Flexeril as directed in the evening. Use Tylenol for pain. Apply heating pad for 10 minutes a few times per day. Follow-up if symptoms are not resolving.   Please take antibiotic as directed.  Increase fluid intake.  Use Saline nasal spray.  Take a daily multivitamin. Resume Flonase daily. Start a daily claritin.  Place a humidifier in the bedroom.  Please call or return clinic if symptoms are not improving.  Sinusitis Sinusitis is redness, soreness, and swelling (inflammation) of the paranasal sinuses. Paranasal sinuses are air pockets within the bones of your face (beneath the eyes, the middle of the forehead, or above the eyes). In healthy paranasal sinuses, mucus is able to drain out, and air is able to circulate through them by way of your nose. However, when your paranasal sinuses are inflamed, mucus and air can become trapped. This can allow bacteria and other germs to grow and cause infection. Sinusitis can develop quickly and last only a short time (acute) or continue over a long period (chronic). Sinusitis that lasts for more than 12 weeks is considered chronic.  CAUSES  Causes of sinusitis include:  Allergies.  Structural abnormalities, such as displacement of the cartilage that separates your nostrils (deviated septum), which can decrease the air flow through your nose and sinuses and affect sinus drainage.  Functional abnormalities, such as when the small hairs (cilia) that line your sinuses and help remove mucus do not work properly or are not present. SYMPTOMS  Symptoms of acute and chronic sinusitis are the same. The primary symptoms are pain and pressure around the affected sinuses. Other symptoms include:  Upper toothache.  Earache.  Headache.  Bad breath.  Decreased sense of smell and taste.  A cough, which worsens  when you are lying flat.  Fatigue.  Fever.  Thick drainage from your nose, which often is green and may contain pus (purulent).  Swelling and warmth over the affected sinuses. DIAGNOSIS  Your caregiver will perform a physical exam. During the exam, your caregiver may:  Look in your nose for signs of abnormal growths in your nostrils (nasal polyps).  Tap over the affected sinus to check for signs of infection.  View the inside of your sinuses (endoscopy) with a special imaging device with a light attached (endoscope), which is inserted into your sinuses. If your caregiver suspects that you have chronic sinusitis, one or more of the following tests may be recommended:  Allergy tests.  Nasal culture A sample of mucus is taken from your nose and sent to a lab and screened for bacteria.  Nasal cytology A sample of mucus is taken from your nose and examined by your caregiver to determine if your sinusitis is related to an allergy. TREATMENT  Most cases of acute sinusitis are related to a viral infection and will resolve on their own within 10 days. Sometimes medicines are prescribed to help relieve symptoms (pain medicine, decongestants, nasal steroid sprays, or saline sprays).  However, for sinusitis related to a bacterial infection, your caregiver will prescribe antibiotic medicines. These are medicines that will help kill the bacteria causing the infection.  Rarely, sinusitis is caused by a fungal infection. In theses cases, your caregiver will prescribe antifungal medicine. For some cases of chronic sinusitis, surgery is needed. Generally, these are cases in which sinusitis recurs more than 3 times per  year, despite other treatments. HOME CARE INSTRUCTIONS   Drink plenty of water. Water helps thin the mucus so your sinuses can drain more easily.  Use a humidifier.  Inhale steam 3 to 4 times a day (for example, sit in the bathroom with the shower running).  Apply a warm, moist  washcloth to your face 3 to 4 times a day, or as directed by your caregiver.  Use saline nasal sprays to help moisten and clean your sinuses.  Take over-the-counter or prescription medicines for pain, discomfort, or fever only as directed by your caregiver. SEEK IMMEDIATE MEDICAL CARE IF:  You have increasing pain or severe headaches.  You have nausea, vomiting, or drowsiness.  You have swelling around your face.  You have vision problems.  You have a stiff neck.  You have difficulty breathing. MAKE SURE YOU:   Understand these instructions.  Will watch your condition.  Will get help right away if you are not doing well or get worse. Document Released: 10/11/2005 Document Revised: 01/03/2012 Document Reviewed: 10/26/2011 [facility] Patient Information 2014 [place], [place].

## 2016-11-03 NOTE — Progress Notes (Signed)
Patient presents to clinic today for follow-up of chronic medical issues.   GERD -- Is followed by Gastroenterology. Is taking Protonix daily. Endorses good relief with medication. Is scheduled for EGD and further assessment with GI specialist.   Anxiety/Depression -- Is taking Cymbalta 30 mg as directed. Endorses good relief of mood with medication. Endorses rare use of Xanax with acute anxiety, maybe once to twice per week.   Hypertension -- Is taking HCTZ as directed. Denies side effect. Patient denies chest pain, palpitations, lightheadedness, dizziness, vision changes or frequent headaches.  Patient c/o > 1 week of sinus pressure, sinus pain with ear pressure and L-sided ear pain. Endorses facial pain for a couple of days. Denies fever, chills. Notes cough that is mainly dry. Has taken Mucinex without much improvement in symptoms.   Patient also endorses intermittent low back pain over the past 3 days, bilateral. Denies known trauma or injury. Denies radiation of pain into lower extremities. Denies change to bowel or bladder habits. Has not taken anything for symptoms.   Past Medical History:  Diagnosis Date  . AC (acromioclavicular) joint bone spurs   . Acid reflux   . Anxiety   . CTS (carpal tunnel syndrome)    Bilateral  . Gastric ulcer   . GERD (gastroesophageal reflux disease)   . Heel spur   . History of chicken pox   . Hypertension   . Panic attacks   . Plantar fasciitis   . Tendonitis    Feet    Current Outpatient Prescriptions on File Prior to Visit  Medication Sig Dispense Refill  . ALPRAZolam (XANAX) 0.25 MG tablet Take 1 tablet (0.25 mg total) by mouth 2 (two) times daily as needed for anxiety. 20 tablet 1  . hydrochlorothiazide (HYDRODIURIL) 25 MG tablet Take 1 tablet (25 mg total) by mouth daily. 90 tablet 0  . Multiple Vitamin (MULTIVITAMIN) tablet Take 1 tablet by mouth daily. Reported on 04/28/2016    . pantoprazole (PROTONIX) 40 MG tablet Take 1 tablet (40  mg total) by mouth daily. 30 tablet 0  . DULoxetine (CYMBALTA) 30 MG capsule Take 1 capsule (30 mg total) by mouth daily. 90 capsule 0   No current facility-administered medications on file prior to visit.     Allergies  Allergen Reactions  . Amoxicillin Nausea Only  . Benadryl [Diphenhydramine Hcl] Itching  . Milk-Related Compounds   . Oxycodone-Acetaminophen Nausea And Vomiting  . Penicillins Nausea Only    Family History  Problem Relation Age of Onset  . Hypertension Father 7361    Deceased  . Heart attack Father   . Hypertension Mother     Living  . Arthritis Mother   . Diabetes Mother   . Dementia Mother   . Arthritis Sister     x2  . Lung cancer Sister     Deceased  . Cancer Other     Paternal & Maternal Aunts  . Heart attack Maternal Grandmother   . Neuropathy Sister   . Heart disease Brother     x1  . Alcoholism Brother     x2    Social History   Social History  . Marital status: Single    Spouse name: N/A  . Number of children: N/A  . Years of education: N/A   Social History Main Topics  . Smoking status: Never Smoker  . Smokeless tobacco: Never Used  . Alcohol use No  . Drug use: No  . Sexual activity: Not Asked  Other Topics Concern  . None   Social History Narrative  . None    Review of Systems - See HPI.  All other ROS are negative.  BP 130/82   Pulse (!) 101   Temp 98.5 F (36.9 C) (Oral)   Resp 16   Ht 5\' 6"  (1.676 m)   Wt 268 lb (121.6 kg)   SpO2 98%   BMI 43.26 kg/m   Physical Exam  Constitutional: She is oriented to person, place, and time and well-developed, well-nourished, and in no distress.  HENT:  Head: Normocephalic and atraumatic.  Right Ear: Tympanic membrane normal.  Left Ear: Tympanic membrane normal.  Nose: Mucosal edema and rhinorrhea present.  Mouth/Throat: Uvula is midline, oropharynx is clear and moist and mucous membranes are normal.  + TTP sinuses on exam.  Cardiovascular: Normal rate, regular rhythm,  normal heart sounds and intact distal pulses.   Pulmonary/Chest: Effort normal and breath sounds normal. No respiratory distress. She has no wheezes. She has no rales. She exhibits no tenderness.  Abdominal: Soft. Bowel sounds are normal. She exhibits no distension and no mass. There is no tenderness. There is no rebound and no guarding.  Musculoskeletal:       Cervical back: She exhibits normal range of motion, no tenderness and no bony tenderness.       Thoracic back: She exhibits normal range of motion, no tenderness and no bony tenderness.       Lumbar back: She exhibits tenderness. She exhibits normal range of motion and no bony tenderness.  Neurological: She is alert and oriented to person, place, and time.  Skin: Skin is warm and dry. No rash noted.  Psychiatric: Affect normal.  Vitals reviewed.    Assessment/Plan: Essential hypertension BP stable on current regimen. Asymptomatic. Will continue current medication regimen. Will check CMP today  Acute bacterial sinusitis Rx Doxycycline.  Increase fluids.  Rest.  Saline nasal spray.  Probiotic.  Mucinex as directed.  Humidifier in bedroom. .  Call or return to clinic if symptoms are not improving.   Gastroesophageal reflux disease without esophagitis Continue PPI. Diet reviewed. FU with GI for EGD as scheduled.   Acute bilateral low back pain without sciatica Supportive measures, stretches and OTC medications reviewed. Rx Flexeril. FU if not resolving over next week.   Generalized anxiety disorder Doing well overall. Continue current regimen. Refills given.     Piedad Climes, PA-C

## 2016-11-03 NOTE — Progress Notes (Signed)
Pre visit review using our clinic review tool, if applicable. No additional management support is needed unless otherwise documented below in the visit note. 

## 2016-11-05 ENCOUNTER — Telehealth: Payer: Self-pay | Admitting: Physician Assistant

## 2016-11-05 NOTE — Telephone Encounter (Signed)
Advised patient to stop the B12 vitamin due to elevated levels. Patient is agreeable

## 2016-11-05 NOTE — Telephone Encounter (Signed)
Pt calling regarding B-12 that she is still taking, asking if she needs to stop due to elevated levels

## 2016-11-07 DIAGNOSIS — J019 Acute sinusitis, unspecified: Principal | ICD-10-CM

## 2016-11-07 DIAGNOSIS — M545 Low back pain, unspecified: Secondary | ICD-10-CM | POA: Insufficient documentation

## 2016-11-07 DIAGNOSIS — B9689 Other specified bacterial agents as the cause of diseases classified elsewhere: Secondary | ICD-10-CM | POA: Insufficient documentation

## 2016-11-07 NOTE — Assessment & Plan Note (Signed)
Continue PPI. Diet reviewed. FU with GI for EGD as scheduled.

## 2016-11-07 NOTE — Assessment & Plan Note (Signed)
Supportive measures, stretches and OTC medications reviewed. Rx Flexeril. FU if not resolving over next week.

## 2016-11-07 NOTE — Assessment & Plan Note (Signed)
Doing well overall. Continue current regimen. Refills given.

## 2016-11-07 NOTE — Assessment & Plan Note (Signed)
Rx Doxycycline.  Increase fluids.  Rest.  Saline nasal spray.  Probiotic.  Mucinex as directed.  Humidifier in bedroom.  Call or return to clinic if symptoms are not improving.  

## 2016-11-07 NOTE — Assessment & Plan Note (Signed)
BP stable on current regimen. Asymptomatic. Will continue current medication regimen. Will check CMP today

## 2016-11-16 ENCOUNTER — Telehealth: Payer: Self-pay | Admitting: Physician Assistant

## 2016-11-16 NOTE — Telephone Encounter (Signed)
Pt states that she has completed Rx for sinus infection and still having a sinus headache and asking if anything could be called in for that, [facility] [address]. Please call when this has been done.

## 2016-11-17 ENCOUNTER — Other Ambulatory Visit: Payer: Self-pay | Admitting: Emergency Medicine

## 2016-11-17 MED ORDER — FLUTICASONE PROPIONATE 50 MCG/ACT NA SUSP: 2.0000 | Freq: Every day | NASAL | 0 refills | Status: DC

## 2016-11-17 NOTE — Telephone Encounter (Signed)
Advised patient to try Flonase OTC 2 sprays each nostril daily. Patient states she already does Flonase. She was very upset for not getting a pill given for her headaches. She did not want to come back in to be evaluated. Added Flonase to her med list

## 2016-11-17 NOTE — Telephone Encounter (Signed)
Patient calling to check status of request for medication.

## 2016-11-17 NOTE — Telephone Encounter (Signed)
Have her start over-the-counter Flonase -- 2 sprays each nostril once daily. This will help with pressure and headache. If not, needs reassessment.

## 2016-11-17 NOTE — Telephone Encounter (Signed)
Spoke with patient she feels like her sinus infection has improved but is continued headache. She has had some ear fullness and headache. She has tried Claritin, Careers adviserAllegra, Tylenol or Ibuprofen with no relief Advised patient she may need another appt for evaluation but patient declines. Any recommendations for headaches.  Wants refill on pantoprazole and Alprazolam last filled 06/14/16 #20 1 rf

## 2016-11-17 NOTE — Telephone Encounter (Signed)
The question is are the headaches stemming from continued sinus infection or from other cause -- migraine, etc. She may need a steroid to help if there is significant inflammation or fluid on the ear but I cannot tell that without taking a look. Cannot give a "pill" for headache without assessing current cause.

## 2016-11-22 ENCOUNTER — Telehealth: Payer: Self-pay | Admitting: Physician Assistant

## 2016-11-22 NOTE — Telephone Encounter (Signed)
Patient calling to request refills of the following:  pantoprazole (PROTONIX) 40 MG tablet hydrochlorothiazide (HYDRODIURIL) 25 MG tablet DULoxetine (CYMBALTA) 30 MG capsule ALPRAZolam (XANAX) 0.25 MG tablet  Pharmacy: [facility] [id number] - [place], [place] - [address] [phone] (Phone) [phone] (Fax)

## 2016-11-25 ENCOUNTER — Other Ambulatory Visit: Payer: Self-pay | Admitting: Emergency Medicine

## 2016-11-25 DIAGNOSIS — I10 Essential (primary) hypertension: Secondary | ICD-10-CM

## 2016-11-25 DIAGNOSIS — F411 Generalized anxiety disorder: Secondary | ICD-10-CM

## 2016-11-25 DIAGNOSIS — K219 Gastro-esophageal reflux disease without esophagitis: Secondary | ICD-10-CM

## 2016-11-25 MED ORDER — PANTOPRAZOLE SODIUM 40 MG PO TBEC: 40.0000 mg | DELAYED_RELEASE_TABLET | Freq: Every day | ORAL | 0 refills | Status: DC

## 2016-11-25 MED ORDER — DULOXETINE HCL 30 MG PO CPEP: 30.0000 mg | ORAL_CAPSULE | Freq: Every day | ORAL | 0 refills | Status: DC

## 2016-11-25 MED ORDER — HYDROCHLOROTHIAZIDE 25 MG PO TABS: 25.0000 mg | ORAL_TABLET | Freq: Every day | ORAL | 0 refills | Status: DC

## 2016-11-25 NOTE — Telephone Encounter (Signed)
Pt calling checking status on this. Pt states that she is out and asking for a call back when this has been called in.

## 2016-11-25 NOTE — Telephone Encounter (Signed)
Last filled 06/14/16 #20 0RF Please advise of refill of Xanax

## 2016-11-25 NOTE — Telephone Encounter (Signed)
Sent in rx of the medications except the Xanax. Waiting on response for refill.

## 2016-11-26 MED ORDER — ALPRAZOLAM 0.25 MG PO TABS: 0.2500 mg | ORAL_TABLET | Freq: Two times a day (BID) | ORAL | 1 refills | Status: DC | PRN

## 2016-11-26 NOTE — Telephone Encounter (Signed)
Faxed rx of Xanax to the pharmacy.

## 2016-11-26 NOTE — Telephone Encounter (Signed)
[facility] to [facility].

## 2016-11-26 NOTE — Telephone Encounter (Signed)
Notified patient of medications sent to the pharmacy

## 2016-11-27 ENCOUNTER — Encounter (HOSPITAL_COMMUNITY): Payer: Self-pay | Admitting: Emergency Medicine

## 2016-11-27 ENCOUNTER — Emergency Department (HOSPITAL_COMMUNITY)
Admission: EM | Admit: 2016-11-27 | Discharge: 2016-11-27 | Disposition: A | Discharge status: Home / Self Care | Payer: PRIVATE HEALTH INSURANCE | Attending: Emergency Medicine | Admitting: Emergency Medicine

## 2016-11-27 ENCOUNTER — Emergency Department (HOSPITAL_COMMUNITY): Payer: PRIVATE HEALTH INSURANCE

## 2016-11-27 DIAGNOSIS — I10 Essential (primary) hypertension: Secondary | ICD-10-CM | POA: Insufficient documentation

## 2016-11-27 DIAGNOSIS — M25561 Pain in right knee: Secondary | ICD-10-CM | POA: Insufficient documentation

## 2016-11-27 DIAGNOSIS — Z79899 Other long term (current) drug therapy: Secondary | ICD-10-CM | POA: Insufficient documentation

## 2016-11-27 MED ORDER — IBUPROFEN 400 MG PO TABS: 400.0000 mg | ORAL_TABLET | Freq: Three times a day (TID) | ORAL | 0 refills | Status: AC

## 2016-11-27 NOTE — ED Provider Notes (Signed)
[facility]-EMERGENCY DEPT Provider Note   CSN: [id number] Arrival date & time: 11/27/16  1720  By signing my name below, I, Doreatha Martin, attest that this documentation has been prepared under the direction and in the presence of Gerhard Munch, MD. Electronically Signed: Doreatha Martin, ED Scribe. 11/27/16. 6:06 PM.     History   Chief Complaint Chief Complaint  Patient presents with  . Knee Pain    HPI Amy Perez is a 51 y.o. female who presents to the Emergency Department complaining of moderate, worsening right knee pain that began 1 week ago and worsened today. Pt states she moved from a seated to standing position when she felt a sudden onset of pain a week ago. She denies fall, LOC, trauma or injury. She states the knee occasionally "pops" but it does not give out on her. Pt states her pain is worsened with weight bearing and ambulation. She reports she has taken Tylenol and a muscle relaxer with no relief of pain. She denies additional complaints. No daily medications. Otherwise healthy.   The history is provided by the patient. No language interpreter was used.    Past Medical History:  Diagnosis Date  . AC (acromioclavicular) joint bone spurs   . Acid reflux   . Anxiety   . CTS (carpal tunnel syndrome)    Bilateral  . Gastric ulcer   . GERD (gastroesophageal reflux disease)   . Heel spur   . History of chicken pox   . Hypertension   . Panic attacks   . Plantar fasciitis   . Tendonitis    Feet    Patient Active Problem List   Diagnosis Date Noted  . Acute bilateral low back pain without sciatica 11/07/2016  . Acute bacterial sinusitis 11/07/2016  . Insomnia 07/02/2016  . Carpal tunnel syndrome 06/29/2016  . Gastroesophageal reflux disease without esophagitis 04/26/2016  . Essential hypertension 04/26/2016  . Generalized anxiety disorder 04/26/2016  . OBESITY, MORBID 03/01/2007    Past Surgical History:  Procedure Laterality Date  . CHOLECYSTECTOMY    .  TUBAL LIGATION  08/29/04  . WISDOM TOOTH EXTRACTION      OB History    No data available       Home Medications    Prior to Admission medications   Medication Sig Start Date End Date Taking? Authorizing Provider  ALPRAZolam (XANAX) 0.25 MG tablet Take 1 tablet (0.25 mg total) by mouth 2 (two) times daily as needed for anxiety. 11/26/16   Waldon Merl, PA-C  cyclobenzaprine (FLEXERIL) 10 MG tablet Take 1 tablet (10 mg total) by mouth at bedtime. 11/03/16   Waldon Merl, PA-C  doxycycline (VIBRAMYCIN) 100 MG capsule Take 1 capsule (100 mg total) by mouth 2 (two) times daily. 11/03/16   Waldon Merl, PA-C  DULoxetine (CYMBALTA) 30 MG capsule Take 1 capsule (30 mg total) by mouth daily. 11/25/16   Waldon Merl, PA-C  fluticasone (FLONASE) 50 MCG/ACT nasal spray Place 2 sprays into both nostrils daily. 11/17/16   Waldon Merl, PA-C  hydrochlorothiazide (HYDRODIURIL) 25 MG tablet Take 1 tablet (25 mg total) by mouth daily. 11/25/16   Waldon Merl, PA-C  Multiple Vitamin (MULTIVITAMIN) tablet Take 1 tablet by mouth daily. Reported on 04/28/2016    Historical Provider, MD  pantoprazole (PROTONIX) 40 MG tablet Take 1 tablet (40 mg total) by mouth daily. 11/25/16   Waldon Merl, PA-C    Family History Family History  Problem Relation Age of Onset  .  Hypertension Father 1061    Deceased  . Heart attack Father   . Hypertension Mother     Living  . Arthritis Mother   . Diabetes Mother   . Dementia Mother   . Arthritis Sister     x2  . Lung cancer Sister     Deceased  . Cancer Other     Paternal & Maternal Aunts  . Heart attack Maternal Grandmother   . Neuropathy Sister   . Heart disease Brother     x1  . Alcoholism Brother     x2    Social History Social History  Substance Use Topics  . Smoking status: Never Smoker  . Smokeless tobacco: Never Used  . Alcohol use No     Allergies   Amoxicillin; Benadryl [diphenhydramine hcl]; Milk-related compounds;  Oxycodone-acetaminophen; and Penicillins   Review of Systems Review of Systems  Constitutional:       Per HPI, otherwise negative  HENT:       Per HPI, otherwise negative  Respiratory:       Per HPI, otherwise negative  Cardiovascular:       Per HPI, otherwise negative  Gastrointestinal: Negative for vomiting.  Endocrine:       Negative aside from HPI  Genitourinary:       Neg aside from HPI   Musculoskeletal:       Per HPI, otherwise negative  Skin: Negative.   Allergic/Immunologic: Negative for immunocompromised state.  Neurological: Negative for syncope.     Physical Exam Updated Vital Signs BP 142/93 (BP Location: Right Arm)   Pulse 93   Temp 98.2 F (36.8 C) (Oral)   Resp 18   LMP 11/13/2016   SpO2 100%   Physical Exam  Constitutional: She is oriented to person, place, and time. She appears well-developed and well-nourished. No distress.  HENT:  Head: Normocephalic and atraumatic.  Eyes: Conjunctivae and EOM are normal.  Cardiovascular: Normal rate and regular rhythm.   Pulmonary/Chest: Effort normal and breath sounds normal. No stridor. No respiratory distress.  Abdominal: She exhibits no distension.  Musculoskeletal: She exhibits no edema.       Right knee: She exhibits normal range of motion, no swelling, no effusion, no ecchymosis, no deformity, no laceration, no erythema, normal alignment, no LCL laxity, normal patellar mobility, normal meniscus and no MCL laxity. Tenderness found. Medial joint line and patellar tendon tenderness noted.       Right ankle: Normal.  Right wrist in semirigid brace.  Neurological: She is alert and oriented to person, place, and time. No cranial nerve deficit.  Skin: Skin is warm and dry.  Psychiatric: She has a normal mood and affect.  Nursing note and vitals reviewed.    ED Treatments / Results   DIAGNOSTIC STUDIES: Oxygen Saturation is 100% on RA, normal by my interpretation.    COORDINATION OF CARE: 6:05 PM  Discussed treatment plan with pt at bedside which includes XR, ace wrap and pt agreed to plan.   Radiology Dg Knee Complete 4 Views Right  Result Date: 11/27/2016 CLINICAL DATA:  Pt c/o entire right knee pain x 1 week. States her R knee popped. EXAM: RIGHT KNEE - COMPLETE 4+ VIEW COMPARISON:  None. FINDINGS: Mild degenerative narrowing of the medial and patellofemoral compartments, with associated mild osseous spurring. No evidence of advanced degenerative joint disease. Bone mineralization is normal. No fracture line or displaced fracture fragment identified. No acute or suspicious osseous lesion. Probable small joint effusion within the  suprapatellar bursa. Superficial soft tissues are unremarkable. IMPRESSION: 1. Probable joint effusion. 2. No acute bony abnormality.  No fracture or dislocation. 3. Mild degenerative changes. Electronically Signed   By: Bary Richard M.D.   On: 11/27/2016 18:18    Procedures Procedures (including critical care time)   Initial Impression / Assessment and Plan / ED Course  I have reviewed the triage vital signs and the nursing notes.  Pertinent labs & imaging results that were available during my care of the patient were reviewed by me and considered in my medical decision making (see chart for details).   I personally performed the services described in this documentation, which was scribed in my presence. The recorded information has been reviewed and is accurate.    Patient presents with almost 1 week of right knee pain. Onset was essentially nontraumatic, and since that time the patient has been ambulatory, weightbearing, but with persistent discomfort. Some suspicion for degenerative changes, and/or cartilaginous disruption. No evidence for substantial laxity, deformity, neurovascular compromise. After reviewing the x-ray with patient and her husband, she was discharged in stable condition to follow-up with orthopedics. Ace wrap provided, ibuprofen  advised.   Gerhard Munch, MD 11/27/16 [phone]

## 2016-11-27 NOTE — Discharge Instructions (Signed)
As discussed, today's evaluation has been generally reassuring. Your knee pain is likely due to degenerative changes. It is important to follow-up with history of primary care physician or an orthopedist to discuss physical therapy, as well as ongoing evaluation. Return here for concerning changes. For the next four days, please be sure to take ibuprofen, 400 mg, 3 times daily.

## 2016-11-27 NOTE — ED Triage Notes (Signed)
Pt c/o R knee pain x 1 week. States her R knee popped. She denies trauma to knee. She had some swelling to R knee, but it has improved. She has taken tylenol for pain with some little relief. States pain is from mid LE up to knee. She rates pain as 8/10 when ambulating.

## 2016-12-28 ENCOUNTER — Other Ambulatory Visit: Payer: Self-pay | Admitting: Physician Assistant

## 2016-12-28 DIAGNOSIS — K219 Gastro-esophageal reflux disease without esophagitis: Secondary | ICD-10-CM

## 2016-12-28 NOTE — Telephone Encounter (Signed)
Patient requesting refill of pantoprazole (PROTONIX) 40 MG tablet.  Pharmacy:  [facility] [id number] [place] - [address] [phone] (Phone) [phone] (Fax)

## 2016-12-30 MED ORDER — PANTOPRAZOLE SODIUM 40 MG PO TBEC: 40.0000 mg | DELAYED_RELEASE_TABLET | Freq: Every day | ORAL | 0 refills | Status: DC

## 2016-12-30 NOTE — Telephone Encounter (Signed)
Medication filled to pharmacy as requested.   

## 2017-02-14 ENCOUNTER — Telehealth: Payer: Self-pay | Admitting: Physician Assistant

## 2017-02-14 NOTE — Telephone Encounter (Signed)
Advised patient her dx is GAD-generalized anxiety disorder.

## 2017-02-14 NOTE — Telephone Encounter (Signed)
Generalized Anxiety Disorder

## 2017-02-14 NOTE — Telephone Encounter (Signed)
Pt states that she is trying to apply for a new insurance and with her taking xanax and cymbalta pt asking if Selena Batten has diagnosed her with Bipolar or was just anxiety, please advise.

## 2017-02-16 ENCOUNTER — Telehealth: Payer: Self-pay | Admitting: Physician Assistant

## 2017-02-16 NOTE — Telephone Encounter (Signed)
Pt asking what can see take OTC for acid reflex.

## 2017-02-17 NOTE — Telephone Encounter (Signed)
Can try Prilosec OTC or Prevacid.  Is there an issue with her being able to afford the Rx medications?

## 2017-02-17 NOTE — Telephone Encounter (Signed)
LMOVM advising of medication she can take OTC for reflux.

## 2017-02-25 ENCOUNTER — Other Ambulatory Visit: Payer: Self-pay | Admitting: Physician Assistant

## 2017-02-25 DIAGNOSIS — I10 Essential (primary) hypertension: Secondary | ICD-10-CM

## 2017-02-25 NOTE — Telephone Encounter (Signed)
Pt asking for refill on her BP meds, [facility] [address]

## 2017-03-17 ENCOUNTER — Telehealth: Payer: Self-pay | Admitting: Physician Assistant

## 2017-03-17 DIAGNOSIS — F411 Generalized anxiety disorder: Secondary | ICD-10-CM

## 2017-03-17 MED ORDER — OMEPRAZOLE 20 MG PO CPDR: 20.0000 mg | DELAYED_RELEASE_CAPSULE | Freq: Every day | ORAL | 1 refills | Status: DC

## 2017-03-17 MED ORDER — DULOXETINE HCL 30 MG PO CPEP: 30.0000 mg | ORAL_CAPSULE | Freq: Every day | ORAL | 0 refills | Status: DC

## 2017-03-17 NOTE — Telephone Encounter (Signed)
Please advise 

## 2017-03-17 NOTE — Telephone Encounter (Signed)
Ok to refill her Cymbalta -- 3 month supply. Will be due for follow-up before additional fills.  If the Protonix is too expense, we can try and Rx for Omeprazole (Prilosec). She can also get OTC. If she wants rx ok to send in rx Omeprazole 20 mg QD 30 with 1 refill.

## 2017-03-17 NOTE — Telephone Encounter (Signed)
Patient is aware of new medication and instructions.  RX's sent to pharmacy.  Patient also aware of need for f/u in 3 months.

## 2017-03-17 NOTE — Telephone Encounter (Signed)
Patient requesting refill of DULoxetine (CYMBALTA) 30 MG capsule.  She is requesting generic since she does not have insurance.  She also states she was not able to pick up rx for pantoprazole (PROTONIX) 40 MG tablet due to cost.  She states she has been taking something otc, she wants to know if there is a cheaper alternative to the pantoprazole (PROTONIX) 40 MG tablet that can be called in to pharmacy.   Pharmacy:  [facility] [id number] [place] - [address] [phone] (Phone) [phone] (Fax)

## 2017-03-18 ENCOUNTER — Telehealth: Payer: Self-pay | Admitting: Physician Assistant

## 2017-03-18 DIAGNOSIS — F419 Anxiety disorder, unspecified: Secondary | ICD-10-CM

## 2017-03-18 NOTE — Telephone Encounter (Signed)
Pt states that the Rx for DULoxetine cost to much and asking if something else could be called in for her that would not cost so much.

## 2017-03-19 NOTE — Telephone Encounter (Signed)
If she feels that she has done well with the duloxetine (cymbalta) then we could try another medication in the same class like Venlafaxine. Has she ever taken before? If not, we could start at a 37.5 mg tablet taking once daily. Ok to send in quantity 30 with 1 refill. She would start the next day after last dose of Cymbalta. Follow-up with me 3-4 weeks after starting. If any side effect of medication or worsening mood, she needs to come see me sooner.

## 2017-03-22 ENCOUNTER — Telehealth: Payer: Self-pay | Admitting: General Practice

## 2017-03-22 DIAGNOSIS — F419 Anxiety disorder, unspecified: Secondary | ICD-10-CM

## 2017-03-22 MED ORDER — FLUOXETINE HCL 20 MG PO TABS: 20.0000 mg | ORAL_TABLET | Freq: Every day | ORAL | 1 refills | Status: DC

## 2017-03-22 MED ORDER — VENLAFAXINE HCL ER 37.5 MG PO CP24: 37.5000 mg | ORAL_CAPSULE | Freq: Every day | ORAL | 1 refills | Status: DC

## 2017-03-22 NOTE — Telephone Encounter (Signed)
Patient has not tried Venlafaxine before, but she is willing to try.   Medication called in to pharmacy for patient.   Patient was scheduled for 4 week follow-up, but instructed that if she has any side effect or worsening mood to call us and get in to be seen sooner.  Patient stated verbal understanding.

## 2017-03-22 NOTE — Telephone Encounter (Signed)
[facility] Night - C TELEPHONE ADVICE RECORD [facility] Patient Name: Amy Perez Gender: Female DOB: [birth year] Age: 51 Y 2 M 13 D Return Phone Number: [phone] (Primary) City/State/Zip: [place] [address] Client [facility] Night - C Client Site [facility] - Night Physician AA - PHYSICIAN, NOT LISTED- MD Who Is Calling Patient / Member / Family / Caregiver Call Type Triage / Clinical Relationship To Patient Self Return Phone Number [phone] (Primary) Chief Complaint Anxiety and Panic Attack Reason for Call Medication Question / Request Initial Comment Caller states she is needing someone to call in a medication for her panic attacks, doctor called in Fluoxetine but it was too expensive, wondering if she can get something else. Sees Dr. Malva Cogan Nurse Assessment Nurse: Tawni Pummel, RN, Claris Che Date/Time Lamount Cohen Time): 03/19/2017 2:05:54 PM Confirm and document reason for call. If symptomatic, describe symptoms. ---Caller states the doctor called in Fluoxetine for her panic attacks and its to expensive can he call in something cheaper for her to use everyday. She is having moody spells and she doesnt feel like herself. Does the PT have any chronic conditions? (i.e. diabetes, asthma, etc.) ---No Is the patient pregnant or possibly pregnant? (Ask all females between the ages of 36-55) ---No Guidelines Guideline Title Affirmed Question Anxiety and Panic Attack Symptoms interfere with sleep Disp. Time Lamount Cohen Time) Disposition Final User 03/19/2017 2:11:33 PM See PCP When Office is Open (within 3 days) Yes Cockrum, RN, Margaret Referrals REFERRED TO PCP OFFICE Care Advice Given Per Guideline SEE PCP WITHIN 3 DAYS: * You need to be seen within 2 or 3 days. Call your doctor during regular office hours and make an appointment. An urgent care center is  often the best source of care if your doctor's office is closed or you can't get an appointment. NOTE: If office will be open tomorrow, tell caller to call then, not in 3 days. HEALTHY LIFESTYLE BASICS: * Sleep - Try to get sufficient amount of sleep. Most people need 7-8 hours of sleep each night. * Regular exercise will improve your overall health, improve your mood, and is a simple method to reduce stress. * Eat a balanced healthy diet. * Drink adequate liquids - 6-8 glasses of water daily. AVOID CAFFEINE: Avoid caffeine-containing beverages. (Reason: it is a stimulant and can aggravate anxiety) Examples include coffee, tea, colas. AVOID TRIGGERS OF ANXIETY: * Limit your alcohol consumption to no more than 2 drinks PLEASE NOTE: All timestamps contained within this report are represented as [place] Standard Time. CONFIDENTIALTY NOTICE: This fax transmission is intended only for the addressee. It contains information that is legally privileged, confidential or otherwise protected from use or disclosure. If you are not the intended recipient, you are strictly prohibited from reviewing, disclosing, copying using or disseminating any of this information or taking any action in reliance on or regarding this information. If you have received this fax in error, please notify us immediately by telephone so that we can arrange for its return to [place]. Phone: [phone], Toll-Free: [phone], Fax: [phone] Page: 2 of 2 Call Id: [id number] Care Advice Given Per Guideline a day. After dinner drinking causes insomnia 3-4 hours after falling asleep. * For smokers - Stop or reduce your smoking (Reason: nicotine is a stimulant) * Avoid diet pills (Reason: they act as stimulants.) * Talk to your doctor before taking any herbal supplements (Reason: some have side effects) STRESS REDUCTION: * Try a  calming activity (e.g., go for a daily walk, spend time with friends/ family, read a book) * Take regular breaks  throughout the day * Occasionally pamper yourself * Take a class on stress management * Learn relaxation technique/meditation/yoga CALL BACK IF: * You feel like harming yourself * You become worse. CARE ADVICE given per Anxiety and Panic Attack (Adult) guideline.

## 2017-03-22 NOTE — Telephone Encounter (Signed)
Spoke with patient and she states the Cymbalta cost was too expensive. Medication was changed to Effexor but she has not picked up yet. I advised to call the pharmacy to see how much the medication is. She will call back if the medication is too expensive.She is only taking the Xanax prn.

## 2017-03-22 NOTE — Telephone Encounter (Signed)
Patient called back and the Effexor will cost her $118.00. Patient states she has not tried Effexor or Fluoxetine before. Per Selena Battenody will start Fluoxetine 20 mg capsule daily for anxiety. [facility] the [facility] list or $10 for 90 day supply. Patient aware the rx has been sent to the pharmacy and is on the $4 list.

## 2017-04-20 ENCOUNTER — Ambulatory Visit: Payer: Self-pay | Admitting: Physician Assistant

## 2017-04-21 ENCOUNTER — Ambulatory Visit: Payer: PRIVATE HEALTH INSURANCE | Attending: Internal Medicine | Admitting: Internal Medicine

## 2017-04-21 ENCOUNTER — Encounter: Payer: Self-pay | Admitting: Internal Medicine

## 2017-04-21 VITALS — BP 144/92 | HR 86 | Temp 98.0°F | Resp 16 | Wt 277.2 lb

## 2017-04-21 DIAGNOSIS — K219 Gastro-esophageal reflux disease without esophagitis: Secondary | ICD-10-CM | POA: Diagnosis not present

## 2017-04-21 DIAGNOSIS — F329 Major depressive disorder, single episode, unspecified: Secondary | ICD-10-CM | POA: Insufficient documentation

## 2017-04-21 DIAGNOSIS — I10 Essential (primary) hypertension: Secondary | ICD-10-CM | POA: Diagnosis not present

## 2017-04-21 DIAGNOSIS — Z8249 Family history of ischemic heart disease and other diseases of the circulatory system: Secondary | ICD-10-CM | POA: Insufficient documentation

## 2017-04-21 DIAGNOSIS — Z131 Encounter for screening for diabetes mellitus: Secondary | ICD-10-CM | POA: Diagnosis not present

## 2017-04-21 DIAGNOSIS — Z811 Family history of alcohol abuse and dependence: Secondary | ICD-10-CM | POA: Insufficient documentation

## 2017-04-21 DIAGNOSIS — G4700 Insomnia, unspecified: Secondary | ICD-10-CM | POA: Insufficient documentation

## 2017-04-21 DIAGNOSIS — F419 Anxiety disorder, unspecified: Secondary | ICD-10-CM

## 2017-04-21 DIAGNOSIS — Z809 Family history of malignant neoplasm, unspecified: Secondary | ICD-10-CM | POA: Insufficient documentation

## 2017-04-21 DIAGNOSIS — Z79899 Other long term (current) drug therapy: Secondary | ICD-10-CM | POA: Insufficient documentation

## 2017-04-21 DIAGNOSIS — B9689 Other specified bacterial agents as the cause of diseases classified elsewhere: Secondary | ICD-10-CM | POA: Insufficient documentation

## 2017-04-21 DIAGNOSIS — J019 Acute sinusitis, unspecified: Secondary | ICD-10-CM | POA: Insufficient documentation

## 2017-04-21 DIAGNOSIS — Z8261 Family history of arthritis: Secondary | ICD-10-CM | POA: Insufficient documentation

## 2017-04-21 DIAGNOSIS — M1711 Unilateral primary osteoarthritis, right knee: Secondary | ICD-10-CM | POA: Diagnosis not present

## 2017-04-21 DIAGNOSIS — G5603 Carpal tunnel syndrome, bilateral upper limbs: Secondary | ICD-10-CM | POA: Insufficient documentation

## 2017-04-21 DIAGNOSIS — F410 Panic disorder [episodic paroxysmal anxiety] without agoraphobia: Secondary | ICD-10-CM | POA: Insufficient documentation

## 2017-04-21 DIAGNOSIS — Z6841 Body Mass Index (BMI) 40.0 and over, adult: Secondary | ICD-10-CM | POA: Diagnosis not present

## 2017-04-21 DIAGNOSIS — Z880 Allergy status to penicillin: Secondary | ICD-10-CM | POA: Insufficient documentation

## 2017-04-21 DIAGNOSIS — F411 Generalized anxiety disorder: Secondary | ICD-10-CM | POA: Insufficient documentation

## 2017-04-21 DIAGNOSIS — Z9049 Acquired absence of other specified parts of digestive tract: Secondary | ICD-10-CM | POA: Insufficient documentation

## 2017-04-21 DIAGNOSIS — M545 Low back pain: Secondary | ICD-10-CM | POA: Insufficient documentation

## 2017-04-21 DIAGNOSIS — Z9889 Other specified postprocedural states: Secondary | ICD-10-CM | POA: Insufficient documentation

## 2017-04-21 DIAGNOSIS — Z888 Allergy status to other drugs, medicaments and biological substances status: Secondary | ICD-10-CM | POA: Insufficient documentation

## 2017-04-21 DIAGNOSIS — Z833 Family history of diabetes mellitus: Secondary | ICD-10-CM | POA: Insufficient documentation

## 2017-04-21 LAB — POCT GLYCOSYLATED HEMOGLOBIN (HGB A1C): HEMOGLOBIN A1C: 5.4

## 2017-04-21 MED ORDER — ESOMEPRAZOLE MAGNESIUM 40 MG PO CPDR: 40.0000 mg | DELAYED_RELEASE_CAPSULE | Freq: Every day | ORAL | 5 refills | Status: DC

## 2017-04-21 MED ORDER — MELOXICAM 7.5 MG PO TABS: 7.5000 mg | ORAL_TABLET | Freq: Every day | ORAL | 5 refills | Status: DC

## 2017-04-21 MED ORDER — ESOMEPRAZOLE MAGNESIUM 20 MG PO CPDR: 20.0000 mg | DELAYED_RELEASE_CAPSULE | Freq: Every day | ORAL | 6 refills | Status: DC

## 2017-04-21 MED ORDER — DULOXETINE HCL 30 MG PO CPEP: 30.0000 mg | ORAL_CAPSULE | Freq: Every day | ORAL | 5 refills | Status: DC

## 2017-04-21 MED ORDER — HYDROCHLOROTHIAZIDE 25 MG PO TABS: 25.0000 mg | ORAL_TABLET | Freq: Every day | ORAL | 1 refills | Status: DC

## 2017-04-21 MED FILL — MELOXICAM 7.5 MG TABLET: 7.5 | 30 days supply | Qty: 30 | Fill #0

## 2017-04-21 MED FILL — HYDROCHLOROTHIAZIDE 25 MG T: 25 | 90 days supply | Qty: 90 | Fill #0

## 2017-04-21 MED FILL — ?DULOXETINE HCL DR 30 MG CA: 30 MG | 30 days supply | Qty: 30 | Fill #0

## 2017-04-21 NOTE — Progress Notes (Signed)
Patient ID: Amy Perez, female    DOB: Jan 01, 1966  MRN: [id number]  CC: New Patient (Initial Visit); Hypertension; and Panic Attack   Subjective: Amy Perez is a 51 y.o. female who presents for new pt visit Her concerns today include:  Pt with hx of HTN, GERD, anxiety/depression, bone spurs in heels.  PCP was at [facility] but she cancelled insurance because it was not paying for med  1. Anxiety/Depression:  -was on Cymbalta for 7-9 mths. worked well for her Cymbalta changed to Prozac in past mth because it was cheaper.  Prozac makes her tired and does not help with body pains like Cymbalta did.  "Like I don't have no energy." -takes Xanax "only when I absolutely really, really have too."  Mother past middle of last mth; that was the last time she took one.  2. GERD: changed from Nexium to Prilosec due to cost but Nexium worked better.  -still gets break reflux on Omeprazole She avoids foods like OJ, sauces, spaghetti and pizza.  3.HTN: did not take med as yet for today.  Ate late and forgot. -usually very good at taking daily -checks BP 2 x a wk.  Gives range 120-130s/80s  4. C/o RT knee pain x 5 mths. Sometimes it pops -swelling at times; + stiffness -worse with prolong standing and walking -x-ry 11/2016 showed mild DJD changes and jt effusion -has a stationary bicycle pedal that she uses 3 x a wk for 15 mins.  Not able to walk as much due to knee pain -"my eating habits could be better. I want to eat more vegetables and fruits." - tries to limit portion sizes Patient Active Problem List   Diagnosis Date Noted  . Diabetes mellitus screening 04/21/2017  . Acute bilateral low back pain without sciatica 11/07/2016  . Acute bacterial sinusitis 11/07/2016  . Insomnia 07/02/2016  . Carpal tunnel syndrome 06/29/2016  . Gastroesophageal reflux disease without esophagitis 04/26/2016  . Essential hypertension 04/26/2016  . Generalized anxiety disorder 04/26/2016  .  OBESITY, MORBID 03/01/2007     Current Outpatient Prescriptions on File Prior to Visit  Medication Sig Dispense Refill  . ALPRAZolam (XANAX) 0.25 MG tablet Take 1 tablet (0.25 mg total) by mouth 2 (two) times daily as needed for anxiety. 20 tablet 1  . cyclobenzaprine (FLEXERIL) 10 MG tablet Take 1 tablet (10 mg total) by mouth at bedtime. 15 tablet 0  . fluticasone (FLONASE) 50 MCG/ACT nasal spray Place 2 sprays into both nostrils daily. 16 g 0  . Multiple Vitamin (MULTIVITAMIN) tablet Take 1 tablet by mouth daily. Reported on 04/28/2016     No current facility-administered medications on file prior to visit.     Allergies  Allergen Reactions  . Amoxicillin Nausea Only  . Benadryl [Diphenhydramine Hcl] Itching  . Milk-Related Compounds   . Oxycodone-Acetaminophen Nausea And Vomiting  . Penicillins Nausea Only    Social History   Social History  . Marital status: Single    Spouse name: N/A  . Number of children: N/A  . Years of education: N/A   Occupational History  . Not on file.   Social History Main Topics  . Smoking status: Never Smoker  . Smokeless tobacco: Never Used  . Alcohol use No  . Drug use: No  . Sexual activity: Not on file   Other Topics Concern  . Not on file   Social History Narrative  . No narrative on file    Family History  Problem  Relation Age of Onset  . Hypertension Father 40       Deceased  . Heart attack Father   . Hypertension Mother        Living  . Arthritis Mother   . Diabetes Mother   . Dementia Mother   . Arthritis Sister        x2  . Lung cancer Sister        Deceased  . Cancer Other        Paternal & Maternal Aunts  . Heart attack Maternal Grandmother   . Neuropathy Sister   . Heart disease Brother        x1  . Alcoholism Brother        x2    Past Surgical History:  Procedure Laterality Date  . CHOLECYSTECTOMY    . TUBAL LIGATION  08/29/04  . WISDOM TOOTH EXTRACTION      ROS: Review of Systems  Respiratory:  Negative for cough and chest tightness.   Cardiovascular: Negative for chest pain and leg swelling.  Neurological: Negative for dizziness and headaches.    PHYSICAL EXAM: BP (!) 144/92   Pulse 86   Temp 98 F (36.7 C) (Oral)   Resp 16   Wt 277 lb 3.2 oz (125.7 kg)   SpO2 98%   BMI 44.74 kg/m   Physical Exam  General appearance - alert, well appearing, obese middle-age African-American female and in no distress Mental status - alert, oriented to person, place, and time, normal mood, behavior, speech, dress, motor activity, and thought processes Eyes - pupils equal and reactive, extraocular eye movements intact Mouth - mucous membranes moist, pharynx normal without lesions Neck - supple, no significant adenopathy Chest - clear to auscultation, no wheezes, rales or rhonchi, symmetric air entry Heart - normal rate, regular rhythm, normal S1, S2, no murmurs, rubs, clicks or gallops Musculoskeletal - right knee: Large body habitus. No tenderness on palpation. Mild crepitus and discomfort on passive range of motion. Extremities - peripheral pulses normal, no pedal edema, no clubbing or cyanosis  Results for orders placed or performed in visit on 04/21/17  POCT glycosylated hemoglobin (Hb A1C)  Result Value Ref Range   Hemoglobin A1C 5.4    ASSESSMENT AND PLAN: 1. Essential hypertension Not at goal. Patient to take her medicine so she returns home and every day as prescribed. DASH diet discussed - hydrochlorothiazide (HYDRODIURIL) 25 MG tablet; Take 1 tablet (25 mg total) by mouth daily.  Dispense: 90 tablet; Refill: 1  2. Class 3 severe obesity due to excess calories without serious comorbidity with body mass index (BMI) of 40.0 to 44.9 in adult [facility]) -Discussed healthy eating habits and importance of regular exercise as tolerated to achieve weight loss and prevent other diseases associated with obesity. -Printed information given  3. Diabetes mellitus screening - POCT  glycosylated hemoglobin (Hb A1C)  4. Primary osteoarthritis of right knee -Stressed the importance of weight loss to decrease mechanical strain on the knees - meloxicam (MOBIC) 7.5 MG tablet; Take 1 tablet (7.5 mg total) by mouth daily.  Dispense: 30 tablet; Refill: 5 - Ambulatory referral to Orthopedic Surgery  5. Anxiety and depression - DULoxetine (CYMBALTA) 30 MG capsule; Take 1 capsule (30 mg total) by mouth daily.  Dispense: 30 capsule; Refill: 5 -She does not need refill on Xanax at this time  6. Gastroesophageal reflux disease without esophagitis GERD precautions discussed - esomeprazole (NEXIUM) 40 MG capsule; Take 1 capsule (20 mg total) by mouth daily  at 12 noon.  Dispense: 30 capsule; Refill: 6    Patient was given the opportunity to ask questions.  Patient verbalized understanding of the plan and was able to repeat key elements of the plan.   Orders Placed This Encounter  Procedures  . Ambulatory referral to Orthopedic Surgery  . POCT glycosylated hemoglobin (Hb A1C)     Requested Prescriptions   Signed Prescriptions Disp Refills  . hydrochlorothiazide (HYDRODIURIL) 25 MG tablet 90 tablet 1    Sig: Take 1 tablet (25 mg total) by mouth daily.  . DULoxetine (CYMBALTA) 30 MG capsule 30 capsule 5    Sig: Take 1 capsule (30 mg total) by mouth daily.  . meloxicam (MOBIC) 7.5 MG tablet 30 tablet 5    Sig: Take 1 tablet (7.5 mg total) by mouth daily.  Marland Kitchen. esomeprazole (NEXIUM) 40 MG capsule 30 capsule 5    Sig: Take 1 capsule (40 mg total) by mouth daily.    Return in about 2 months (around 06/21/2017).  Jonah Blueeborah Abigail Marsiglia, MD, FACP

## 2017-04-21 NOTE — Patient Instructions (Signed)

## 2017-04-22 MED FILL — ?ESOMEPRAZOLE MAG DR 40MG C: 40 | 30 days supply | Qty: 30 | Fill #0

## 2017-04-25 ENCOUNTER — Telehealth: Payer: Self-pay | Admitting: Internal Medicine

## 2017-04-25 DIAGNOSIS — K029 Dental caries, unspecified: Secondary | ICD-10-CM

## 2017-04-25 NOTE — Telephone Encounter (Signed)
Pt came in asking for a referral for dental. Pt is applying for OC on 05/04/17. Please f/u. Thank you.

## 2017-05-04 ENCOUNTER — Ambulatory Visit: Payer: Self-pay | Attending: Internal Medicine

## 2017-05-24 MED FILL — MELOXICAM 7.5 MG TABLET: 7.5 | 30 days supply | Qty: 30 | Fill #1

## 2017-05-24 MED FILL — ?DULOXETINE HCL DR 30 MG CA: 30 MG | 30 days supply | Qty: 30 | Fill #1

## 2017-06-08 ENCOUNTER — Ambulatory Visit (INDEPENDENT_AMBULATORY_CARE_PROVIDER_SITE_OTHER): Payer: Self-pay | Admitting: Orthopaedic Surgery

## 2017-06-08 DIAGNOSIS — M1711 Unilateral primary osteoarthritis, right knee: Secondary | ICD-10-CM

## 2017-06-08 DIAGNOSIS — M25561 Pain in right knee: Secondary | ICD-10-CM | POA: Insufficient documentation

## 2017-06-08 DIAGNOSIS — G8929 Other chronic pain: Secondary | ICD-10-CM

## 2017-06-08 MED ORDER — LIDOCAINE HCL 1 % IJ SOLN
3.0000 mL | INTRAMUSCULAR | Status: AC | PRN
Start: 2017-06-08 — End: 2017-06-08
  Administered 2017-06-08: 3 mL

## 2017-06-08 MED ORDER — METHYLPREDNISOLONE ACETATE 40 MG/ML IJ SUSP
40.0000 mg | INTRAMUSCULAR | Status: AC | PRN
  Administered 2017-06-08: 40 mg via INTRA_ARTICULAR

## 2017-06-08 NOTE — Progress Notes (Signed)
Office Visit Note   Patient: Amy Perez           Date of Birth: 18-Sep-1966           MRN: [id number] Visit Date: 06/08/2017              Requested by: Marcine Matar, MD [address] [place], [place] [address] PCP: Marcine Matar, MD   Assessment & Plan: Visit Diagnoses:  1. Chronic pain of right knee   2. Primary osteoarthritis of right knee     Plan: Her knee pain is definitely from arthritis in the knee. I showed her quad strengthening exercises that are demonstrated these back to me. I talked about weight loss with her as well as trying education such as Tumeric to try to help with knee arthritic pain. Also offered a steroid injection in her knee and explained the risks and benefits of this as well. She agreed with this as she tolerated the injection well. Certainly she is a candidate for hyaluronic acid but being that she is a "[facility]" patient in the [facility] she will likely not be able to have that type of injection provided. I'll see her back in 4 weeks to see how she doing overall. All questions were encouraged and answered.  Follow-Up Instructions: Return in about 4 weeks (around 07/06/2017).   Orders:  Orders Placed This Encounter  Procedures  . Large Joint Injection/Arthrocentesis   No orders of the defined types were placed in this encounter.     Procedures: Large Joint Inj Date/Time: 06/08/2017 9:21 AM Performed by: Kathryne Hitch Authorized by: Kathryne Hitch   Location:  Knee Site:  R knee Ultrasound Guidance: No   Fluoroscopic Guidance: No   Arthrogram: No   Medications:  3 mL lidocaine 1 %; 40 mg methylPREDNISolone acetate 40 MG/ML     Clinical Data: No additional findings.   Subjective: No chief complaint on file. The patient that I'm seeing for the first time. She comes her referral for chronic right knee pain. She has x-rays from February of this year. She is someone that is moderately obese as his  hurts mainly with activities and standing on her knee. She said her knee does swell as well. She's never had any type of injection and she says her knee pops quite a bit. She says his detrimentally affecting her activities daily living, her quality of life, and her mobility. She does try to use an exercise bike. She says that she is not a diabetic.  HPI  Review of Systems She denies any headache, chest pain, shortness of breath, fever, chills, nausea, vomiting.  Objective: Vital Signs: There were no vitals taken for this visit.  Physical Exam She is alert and oriented 3 and in no acute distress Ortho Exam Examination of her right knee does show a mild varus malalignment. There is significant patellofemoral crepitation with just a mild effusion. Her knee is ligamentously stable with full range of motion. Specialty Comments:  No specialty comments available.  Imaging: No results found. X-rays independently reviewed of her right knee show moderate patellofemoral arthritic changes. There is mild arthritic changes at the medial lateral compartments with mild varus malalignment. There is otherwise no acute changes.  PMFS History: Patient Active Problem List   Diagnosis Date Noted  . Chronic pain of right knee 06/08/2017  . Diabetes mellitus screening 04/21/2017  . Primary osteoarthritis of right knee 04/21/2017  . Acute bilateral low back  pain without sciatica 11/07/2016  . Acute bacterial sinusitis 11/07/2016  . Insomnia 07/02/2016  . Carpal tunnel syndrome 06/29/2016  . Gastroesophageal reflux disease without esophagitis 04/26/2016  . Essential hypertension 04/26/2016  . Generalized anxiety disorder 04/26/2016  . OBESITY, MORBID 03/01/2007   Past Medical History:  Diagnosis Date  . AC (acromioclavicular) joint bone spurs   . Acid reflux   . Anxiety   . CTS (carpal tunnel syndrome)    Bilateral  . Gastric ulcer   . GERD (gastroesophageal reflux disease)   . Heel spur   .  History of chicken pox   . Hypertension   . Panic attacks   . Plantar fasciitis   . Tendonitis    Feet    Family History  Problem Relation Age of Onset  . Hypertension Father [id number]       Deceased  . Heart attack Father   . Hypertension Mother        Living  . Arthritis Mother   . Diabetes Mother   . Dementia Mother   . Arthritis Sister        x2  . Lung cancer Sister        Deceased  . Cancer Other        Paternal & Maternal Aunts  . Heart attack Maternal Grandmother   . Neuropathy Sister   . Heart disease Brother        x1  . Alcoholism Brother        x2    Past Surgical History:  Procedure Laterality Date  . CHOLECYSTECTOMY    . TUBAL LIGATION  08/29/04  . WISDOM TOOTH EXTRACTION     Social History   Occupational History  . Not on file.   Social History Main Topics  . Smoking status: Never Smoker  . Smokeless tobacco: Never Used  . Alcohol use No  . Drug use: No  . Sexual activity: Not on file

## 2017-06-24 ENCOUNTER — Encounter: Payer: Self-pay | Admitting: Internal Medicine

## 2017-06-24 ENCOUNTER — Ambulatory Visit: Payer: Self-pay | Attending: Internal Medicine | Admitting: Internal Medicine

## 2017-06-24 VITALS — BP 130/86 | HR 97 | Resp 18 | Wt 281.0 lb

## 2017-06-24 DIAGNOSIS — Z6841 Body Mass Index (BMI) 40.0 and over, adult: Secondary | ICD-10-CM | POA: Insufficient documentation

## 2017-06-24 DIAGNOSIS — G4700 Insomnia, unspecified: Secondary | ICD-10-CM | POA: Insufficient documentation

## 2017-06-24 DIAGNOSIS — Z2821 Immunization not carried out because of patient refusal: Secondary | ICD-10-CM

## 2017-06-24 DIAGNOSIS — F329 Major depressive disorder, single episode, unspecified: Secondary | ICD-10-CM | POA: Insufficient documentation

## 2017-06-24 DIAGNOSIS — Z1239 Encounter for other screening for malignant neoplasm of breast: Secondary | ICD-10-CM

## 2017-06-24 DIAGNOSIS — K219 Gastro-esophageal reflux disease without esophagitis: Secondary | ICD-10-CM | POA: Insufficient documentation

## 2017-06-24 DIAGNOSIS — M7752 Other enthesopathy of left foot: Secondary | ICD-10-CM | POA: Insufficient documentation

## 2017-06-24 DIAGNOSIS — F411 Generalized anxiety disorder: Secondary | ICD-10-CM | POA: Insufficient documentation

## 2017-06-24 DIAGNOSIS — R0981 Nasal congestion: Secondary | ICD-10-CM | POA: Insufficient documentation

## 2017-06-24 DIAGNOSIS — I10 Essential (primary) hypertension: Secondary | ICD-10-CM | POA: Insufficient documentation

## 2017-06-24 DIAGNOSIS — J329 Chronic sinusitis, unspecified: Secondary | ICD-10-CM

## 2017-06-24 DIAGNOSIS — M7751 Other enthesopathy of right foot: Secondary | ICD-10-CM | POA: Insufficient documentation

## 2017-06-24 DIAGNOSIS — Z880 Allergy status to penicillin: Secondary | ICD-10-CM | POA: Insufficient documentation

## 2017-06-24 DIAGNOSIS — F419 Anxiety disorder, unspecified: Secondary | ICD-10-CM

## 2017-06-24 DIAGNOSIS — Z79899 Other long term (current) drug therapy: Secondary | ICD-10-CM | POA: Insufficient documentation

## 2017-06-24 DIAGNOSIS — M1711 Unilateral primary osteoarthritis, right knee: Secondary | ICD-10-CM | POA: Insufficient documentation

## 2017-06-24 DIAGNOSIS — Z1231 Encounter for screening mammogram for malignant neoplasm of breast: Secondary | ICD-10-CM

## 2017-06-24 MED FILL — MELOXICAM 7.5 MG TABLET: 7.5 | 30 days supply | Qty: 30 | Fill #2

## 2017-06-24 MED FILL — ESOMEPRAZOLE MAG DR 40 MG C: 40 | 30 days supply | Qty: 30 | Fill #1

## 2017-06-24 MED FILL — ?DULOXETINE HCL DR 30 MG CA: 30 MG | 30 days supply | Qty: 30 | Fill #2

## 2017-06-24 NOTE — Progress Notes (Signed)
Patient ID: Amy Perez, female    DOB: 1966-06-11  MRN: [id number]  CC: Follow-up and Sinus Problem   Subjective: Amy Perez is a 51 y.o. female who presents for chronic disease management. Last seen 2 months ago Her concerns today include:  Pt with hx of HTN, GERD, anxiety/depression, bone spurs in heels  1. Request refer to ENT due to recurrent sinus issues -gets 8-9 episodes of sinus infection  in winter. Reports about 2-3 episodes this year. Treated with Zithromax -gets drainage at back of throat -current symptom: front HA, congestion, sore throat.  No drainage from nose No fever. Uses Flonase and OTC Claritin  -has [facility] and [facility] discount  2. Dep/Anxiety -Reports mood swings Misses her mother home she cared for up to the time of death. Mother had dementia. 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 HTN -Compliant with HCTZ No chest pains or shortness of breath Patient Active Problem List   Diagnosis Date Noted  . Chronic pain of right knee 06/08/2017  . Diabetes mellitus screening 04/21/2017  . Primary osteoarthritis of right knee 04/21/2017  . Acute bilateral low back pain without sciatica 11/07/2016  . Acute bacterial sinusitis 11/07/2016  . Insomnia 07/02/2016  . Carpal tunnel syndrome 06/29/2016  . Gastroesophageal reflux disease without esophagitis 04/26/2016  . Essential hypertension 04/26/2016  . Generalized anxiety disorder 04/26/2016  . OBESITY, MORBID 03/01/2007     Current Outpatient Prescriptions on File Prior to Visit  Medication Sig Dispense Refill  . ALPRAZolam (XANAX) 0.25 MG tablet Take 1 tablet (0.25 mg total) by mouth 2 (two) times daily as needed for anxiety. 20 tablet 1  . cyclobenzaprine (FLEXERIL) 10 MG tablet Take 1 tablet (10 mg total) by mouth at bedtime. 15 tablet 0  . DULoxetine (CYMBALTA) 30 MG capsule  Take 1 capsule (30 mg total) by mouth daily. 30 capsule 5  . esomeprazole (NEXIUM) 40 MG capsule Take 1 capsule (40 mg total) by mouth daily. 30 capsule 5  . fluticasone (FLONASE) 50 MCG/ACT nasal spray Place 2 sprays into both nostrils daily. 16 g 0  . hydrochlorothiazide (HYDRODIURIL) 25 MG tablet Take 1 tablet (25 mg total) by mouth daily. 90 tablet 1  . meloxicam (MOBIC) 7.5 MG tablet Take 1 tablet (7.5 mg total) by mouth daily. 30 tablet 5  . Multiple Vitamin (MULTIVITAMIN) tablet Take 1 tablet by mouth daily. Reported on 04/28/2016     No current facility-administered medications on file prior to visit.     Allergies  Allergen Reactions  . Amoxicillin Nausea Only  . Benadryl [Diphenhydramine Hcl] Itching  . Milk-Related Compounds   . Oxycodone-Acetaminophen Nausea And Vomiting  . Penicillins Nausea Only    Social History   Social History  . Marital status: Single    Spouse name: N/A  . Number of children: N/A  . Years of education: N/A   Occupational History  . Not on file.   Social History Main Topics  . Smoking status: Never Smoker  . Smokeless tobacco: Never Used  . Alcohol use No  . Drug use: No  . Sexual activity: Not on file   Other Topics Concern  . Not on file   Social History Narrative  . No narrative on file    Family History  Problem Relation Age of Onset  . Hypertension Father 79  Deceased  . Heart attack Father   . Hypertension Mother        Living  . Arthritis Mother   . Diabetes Mother   . Dementia Mother   . Arthritis Sister        x2  . Lung cancer Sister        Deceased  . Cancer Other        Paternal & Maternal Aunts  . Heart attack Maternal Grandmother   . Neuropathy Sister   . Heart disease Brother        x1  . Alcoholism Brother        x2    Past Surgical History:  Procedure Laterality Date  . CHOLECYSTECTOMY    . TUBAL LIGATION  08/29/04  . WISDOM TOOTH EXTRACTION      ROS: Review of Systems Negative except as  stated above PHYSICAL EXAM: BP 130/86   Pulse 97   Resp 18   Wt 281 lb (127.5 kg)   LMP 06/07/2017   SpO2 98%   BMI 45.35 kg/m   Physical Exam General appearance - alert, well appearing, obese female and in no distress Mental status - normal mood and affect Nose -nasal mucosa dry. Mild enlargement of nasal turbinates Mouth - mucous membranes moist, pharynx normal without lesions Neck - supple, no significant adenopathy Chest - clear to auscultation, no wheezes, rales or rhonchi, symmetric air entry Heart - normal rate, regular rhythm, normal S1, S2, no murmurs, rubs, clicks or gallops Extremities - peripheral pulses normal, no pedal edema, no clubbing or cyanosis    ASSESSMENT AND PLAN: 1. Anxiety and depression -Patient declined increased dose of Cymbalta. States she really would like just a therapist - Ambulatory referral to Psychiatry  2. Essential hypertension At goal. Continue HCTZ  3. Chronic congestion of paranasal sinus -Clinically she does not appear to have a sinus infection at this time but will get imaging of the sinuses given history of recurrent sinusitis - CT Maxillofacial WO CM; Future  4. Influenza vaccination declined  5. Breast CA screening Patient was given the opportunity to ask questions.  Patient verbalized understanding of the plan and was able to repeat key elements of the plan.   Orders Placed This Encounter  Procedures  . CT Maxillofacial WO CM  . Ambulatory referral to Psychiatry     Requested Prescriptions    No prescriptions requested or ordered in this encounter    Return in about 3 months (around 09/23/2017).  Jonah Blueeborah Azari Janssens, MD, FACP

## 2017-06-28 ENCOUNTER — Telehealth: Payer: Self-pay | Admitting: Internal Medicine

## 2017-06-28 NOTE — Addendum Note (Signed)
Addended by: Jonah BlueJOHNSON, Orlinda Slomski B on: 06/28/2017 04:20 PM   Modules accepted: Orders

## 2017-06-28 NOTE — Telephone Encounter (Signed)
Pt came to the office requesting a CT-Scan be schedule as soon is possible, please follow up

## 2017-07-01 NOTE — Telephone Encounter (Signed)
Pt. Called requesting to know the status of the CT-scan for her head. Please f/u

## 2017-07-01 NOTE — Telephone Encounter (Signed)
July 05, 2017 @330  [facility] @315  [facility] Pt is aware of appointment and doesn't have any questions or concerns

## 2017-07-05 ENCOUNTER — Encounter (HOSPITAL_COMMUNITY): Payer: Self-pay

## 2017-07-05 ENCOUNTER — Ambulatory Visit (HOSPITAL_COMMUNITY)
Admission: RE | Admit: 2017-07-05 | Discharge: 2017-07-05 | Disposition: A | Discharge status: Home / Self Care | Payer: Self-pay | Source: Ambulatory Visit | Attending: Internal Medicine | Admitting: Internal Medicine

## 2017-07-05 DIAGNOSIS — H748X1 Other specified disorders of right middle ear and mastoid: Secondary | ICD-10-CM | POA: Insufficient documentation

## 2017-07-05 DIAGNOSIS — J329 Chronic sinusitis, unspecified: Secondary | ICD-10-CM | POA: Insufficient documentation

## 2017-07-05 MED ORDER — IOPAMIDOL (ISOVUE-300) INJECTION 61%
INTRAVENOUS | Status: AC
  Administered 2017-07-05: 75 mL via INTRAVENOUS
  Filled 2017-07-05: qty 75

## 2017-07-06 ENCOUNTER — Telehealth: Payer: Self-pay | Admitting: Internal Medicine

## 2017-07-06 DIAGNOSIS — J329 Chronic sinusitis, unspecified: Secondary | ICD-10-CM

## 2017-07-06 NOTE — Telephone Encounter (Signed)
PC placed to pt this evening. Pt informed that CT revealed changes c/w chronic sinusitis. Also has septal deviation to LT and small effusion RT mastoid bone. Will refer to ENT. Pt expressed understanding and was greatfull for the call.

## 2017-07-11 ENCOUNTER — Ambulatory Visit (INDEPENDENT_AMBULATORY_CARE_PROVIDER_SITE_OTHER): Payer: Self-pay | Admitting: Orthopaedic Surgery

## 2017-07-15 ENCOUNTER — Other Ambulatory Visit: Payer: Self-pay | Admitting: Internal Medicine

## 2017-08-02 MED FILL — ?DULOXETINE HCL DR 30 MG CA: 30 MG | 30 days supply | Qty: 30 | Fill #3

## 2017-08-02 MED FILL — MELOXICAM 7.5 MG TABLET: 7.5 | 30 days supply | Qty: 30 | Fill #3

## 2017-08-02 MED FILL — ESOMEPRAZOLE MAG DR 40 MG C: 40 | 30 days supply | Qty: 30 | Fill #2

## 2017-08-02 MED FILL — HYDROCHLOROTHIAZIDE 25 MG T: 25 | 30 days supply | Qty: 30 | Fill #1

## 2017-08-03 ENCOUNTER — Encounter (HOSPITAL_COMMUNITY): Payer: Self-pay | Admitting: Licensed Clinical Social Worker

## 2017-08-03 ENCOUNTER — Ambulatory Visit (INDEPENDENT_AMBULATORY_CARE_PROVIDER_SITE_OTHER): Payer: No Typology Code available for payment source | Admitting: Licensed Clinical Social Worker

## 2017-08-03 ENCOUNTER — Encounter (INDEPENDENT_AMBULATORY_CARE_PROVIDER_SITE_OTHER): Payer: Self-pay

## 2017-08-03 DIAGNOSIS — F32A Depression, unspecified: Secondary | ICD-10-CM

## 2017-08-03 DIAGNOSIS — J329 Chronic sinusitis, unspecified: Secondary | ICD-10-CM

## 2017-08-03 DIAGNOSIS — F329 Major depressive disorder, single episode, unspecified: Secondary | ICD-10-CM

## 2017-08-03 DIAGNOSIS — F419 Anxiety disorder, unspecified: Secondary | ICD-10-CM
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
## 2017-08-08 ENCOUNTER — Telehealth: Payer: Self-pay

## 2017-08-08 NOTE — Telephone Encounter (Signed)
Pt called to check on the status of her referral to the ENT. I informed pt that it is taking a while for referrals to get done but we are trying to get them done as fast as we can. Please f/u

## 2017-08-24 ENCOUNTER — Ambulatory Visit (INDEPENDENT_AMBULATORY_CARE_PROVIDER_SITE_OTHER): Payer: No Typology Code available for payment source | Admitting: Licensed Clinical Social Worker

## 2017-08-24 ENCOUNTER — Encounter (HOSPITAL_COMMUNITY): Payer: Self-pay | Admitting: Licensed Clinical Social Worker

## 2017-08-24 DIAGNOSIS — F419 Anxiety disorder, unspecified: Secondary | ICD-10-CM

## 2017-08-24 DIAGNOSIS — F329 Major depressive disorder, single episode, unspecified: Secondary | ICD-10-CM

## 2017-08-24 DIAGNOSIS — Z634 Disappearance and death of family member: Secondary | ICD-10-CM

## 2017-08-24 NOTE — Progress Notes (Signed)
   THERAPIST PROGRESS NOTE  Session Time: 3:10-4pm  Participation Level: Active  Behavioral Response: CasualAlertEuthymic  Type of Therapy: Individual Therapy  Treatment Goals addressed: Diagnosis: Depression/Anxiety  Interventions: Motivational Interviewing and Supportive  Summary: Amy Perez is a 51 y.o. female who presents for her first individual counseling session. Spent a considerable amount of time building a trusting therapeutic relationship. Discussed depression and anxiety dx with pt along with symptoms. Pt lives with her boyfriend. She shared he is not very supportive. Pt has no children. Her mother passed away in 2/18 which is a stressor for pt as she has not dealt with her grief. 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 She likes her job but becomes frustrated when they don't follow her instructions. She does not sleep well and is interested in seeing a psychiatrist for medication management. Gave pt anxiety handouts and depression module 1. Gave pt meditation handout and gave pt journal.  Suicidal/Homicidal: Nowithout intent/plan  Therapist Response: Assessed pt'Perez current functioning and reviewed progress. Assisted pt building a trusting therapeutic relationship and learning more about her diagnosis. Assisted pt processing for the management of her stressors.  Plan: Return again in 2 weeks.  Diagnosis: Axis I: Depression and Anxiety    Amy Perez, LCAS 08/24/2017

## 2017-08-26 ENCOUNTER — Ambulatory Visit (INDEPENDENT_AMBULATORY_CARE_PROVIDER_SITE_OTHER): Payer: No Typology Code available for payment source | Admitting: Psychiatry

## 2017-08-26 ENCOUNTER — Encounter (HOSPITAL_COMMUNITY): Payer: Self-pay | Admitting: Psychiatry

## 2017-08-26 VITALS — BP 136/80 | HR 80 | Ht 66.0 in | Wt 290.8 lb

## 2017-08-26 DIAGNOSIS — F329 Major depressive disorder, single episode, unspecified: Secondary | ICD-10-CM

## 2017-08-26 DIAGNOSIS — F331 Major depressive disorder, recurrent, moderate: Secondary | ICD-10-CM

## 2017-08-26 DIAGNOSIS — G894 Chronic pain syndrome: Secondary | ICD-10-CM

## 2017-08-26 DIAGNOSIS — F419 Anxiety disorder, unspecified: Secondary | ICD-10-CM

## 2017-08-26 MED ORDER — TRAZODONE HCL 50 MG PO TABS: 50.0000 mg | ORAL_TABLET | Freq: Every day | ORAL | 1 refills | Status: DC

## 2017-08-26 MED ORDER — DULOXETINE HCL 60 MG PO CPEP: 60.0000 mg | ORAL_CAPSULE | Freq: Every day | ORAL | 1 refills | Status: DC

## 2017-08-26 MED FILL — traZODone HCL 50 MG TABS: 50 | 30 days supply | Qty: 30 | Fill #0

## 2017-08-26 MED FILL — ?DULOXETINE HCL DR 60 MG CA: 60 MG | 30 days supply | Qty: 30 | Fill #0

## 2017-08-26 NOTE — Progress Notes (Signed)
Psychiatric Initial Adult Assessment   Patient Identification: Amy Perez MRN:  [id number] Date of Evaluation:  08/26/2017 Referral Source: Beth Chief Complaint:  anxiety, hot flashes, irritability Visit Diagnosis:    ICD-10-CM   1. Moderate episode of recurrent major depressive disorder (HCC) F33.1   2. Anxiety and depression F41.9 DULoxetine (CYMBALTA) 60 MG capsule   F32.9 traZODone (DESYREL) 50 MG tablet  3. Chronic pain syndrome G89.4     History of Present Illness:  Amy Perez is a 51 year old female currently experiencing symptoms of menopause, and recent grief related to the loss of her mother in April 2018.  She is actively engaged in individual therapy in this office.  She presents today for psychiatric medication management assessment.  She presents as fairly dysphoric and guarded, but not acutely paranoid.  She reports that she has trouble trusting others, and at times appears irritated by questions about her past personal history.  She reports that she is on Cymbalta 30 mg, and it has helped her with improving frustration tolerance and irritability, but not fully.  She reports that she also  struggles with depression and feeling lonely/isolated.  She reports that she and her boyfriend get into arguments, and she also has struggle with difficulty sleeping.  She has never taken any medications for sleep.  She reports that she also struggles with chronic pain related to arthritis.  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  She reports that she has been taking care of him for a long time, and he is almost like family.  She reports that she does like her job but does also feel sometimes burned out by taking care of others.  She reports that she does not do a very good job taking care of herself, but does try to go shopping, watch TV shows.  She loves going to the flea market and finding bargain purchases.  She denies  any acute safety issues or suicidality.  She is hopeful that medication can help with her sleep and mood symptoms.  I spent time educating her on Cymbalta and its modality and assisting with mood and pain, and I also educated her on the use of trazodone for sleep.  We agreed to increase Cymbalta to 60 mg and initiate trazodone 25-50 mg nightly for insomnia.  Associated Signs/Symptoms: Depression Symptoms:  depressed mood, anhedonia, insomnia, fatigue, difficulty concentrating, hopelessness, anxiety, (Hypo) Manic Symptoms:  Irritable Mood, Anxiety Symptoms:  Excessive Worry, Psychotic Symptoms:  none  Past Psychiatric History: No psychiatric hospitalizations, recently started therapy with Beth in October  Previous Psychotropic Medications: Yes   Substance Abuse History in the last 12 months:  No.  Consequences of Substance Abuse: Negative  Past Medical History:  Past Medical History:  Diagnosis Date  . AC (acromioclavicular) joint bone spurs   . Acid reflux   . Anxiety   . CTS (carpal tunnel syndrome)    Bilateral  . Gastric ulcer   . GERD (gastroesophageal reflux disease)   . Heel spur   . History of chicken pox   . Hypertension   . Panic attacks   . Plantar fasciitis   . Tendonitis    Feet    Past Surgical History:  Procedure Laterality Date  . CHOLECYSTECTOMY    . TUBAL LIGATION  08/29/04  . WISDOM TOOTH EXTRACTION      Family Psychiatric History: Reviewed as below, history of substance use  Family  History:  Family History  Problem Relation Age of Onset  . Hypertension Father 56       Deceased  . Heart attack Father   . Hypertension Mother        Living  . Arthritis Mother   . Diabetes Mother   . Dementia Mother   . Arthritis Sister        x2  . Lung cancer Sister        Deceased  . Cancer Other        Paternal & Maternal Aunts  . Heart attack Maternal Grandmother   . Neuropathy Sister   . Heart disease Brother        x1  . Alcoholism Brother         x2    Social History:   Social History   Social History  . Marital status: Single    Spouse name: N/A  . Number of children: N/A  . Years of education: N/A   Social History Main Topics  . Smoking status: Never Smoker  . Smokeless tobacco: Never Used  . Alcohol use No  . Drug use: No  . Sexual activity: Not Asked   Other Topics Concern  . None   Social History Narrative  . None    Additional Social History: Currently works as a Lawyer, lives with her boyfriend of 5 years, was married once in her 40s and was divorced after 6 years due to her ex-husband struggle with alcoholism  Allergies:   Allergies  Allergen Reactions  . Amoxicillin Nausea Only  . Benadryl [Diphenhydramine Hcl] Itching  . Milk-Related Compounds   . Oxycodone-Acetaminophen Nausea And Vomiting  . Penicillins Nausea Only    Metabolic Disorder Labs: Lab Results  Component Value Date   HGBA1C 5.4 04/21/2017   No results found for: PROLACTIN No results found for: CHOL, TRIG, HDL, CHOLHDL, VLDL, LDLCALC   Current Medications: Current Outpatient Prescriptions  Medication Sig Dispense Refill  . cyclobenzaprine (FLEXERIL) 10 MG tablet Take 1 tablet (10 mg total) by mouth at bedtime. 15 tablet 0  . DULoxetine (CYMBALTA) 60 MG capsule Take 1 capsule (60 mg total) by mouth daily. 90 capsule 1  . esomeprazole (NEXIUM) 40 MG capsule Take 1 capsule (40 mg total) by mouth daily. 30 capsule 5  . fluticasone (FLONASE) 50 MCG/ACT nasal spray Place 2 sprays into both nostrils daily. 16 g 0  . hydrochlorothiazide (HYDRODIURIL) 25 MG tablet Take 1 tablet (25 mg total) by mouth daily. 90 tablet 1  . meloxicam (MOBIC) 7.5 MG tablet Take 1 tablet (7.5 mg total) by mouth daily. 30 tablet 5  . Multiple Vitamin (MULTIVITAMIN) tablet Take 1 tablet by mouth daily. Reported on 04/28/2016    . traZODone (DESYREL) 50 MG tablet Take 1 tablet (50 mg total) by mouth at bedtime. 90 tablet 1   No current facility-administered  medications for this visit.     Neurologic: Headache: Yes Seizure: Negative Paresthesias:Yes  Musculoskeletal: Strength & Muscle Tone: within normal limits Gait & Station: normal Patient leans: N/A  Psychiatric Specialty Exam: Review of Systems  Constitutional: Negative.   HENT: Negative.   Eyes: Negative.   Respiratory: Negative.   Cardiovascular: Negative.   Genitourinary: Negative.   Musculoskeletal: Positive for joint pain.  Neurological: Positive for tingling.  Psychiatric/Behavioral: Positive for depression. Negative for hallucinations, memory loss, substance abuse and suicidal ideas. The patient is nervous/anxious and has insomnia.     Blood pressure 136/80, pulse 80, height 5\' 6"  (  1.676 m), weight 290 lb 12.8 oz (131.9 kg).Body mass index is 46.94 kg/m.  General Appearance: Casual and Guarded  Eye Contact:  Fair  Speech:  Clear and Coherent  Volume:  Normal  Mood:  Anxious, Depressed and Irritable  Affect:  Depressed  Thought Process:  Coherent, Goal Directed and Descriptions of Associations: Intact  Orientation:  Full (Time, Place, and Person)  Thought Content:  Logical  Suicidal Thoughts:  No  Homicidal Thoughts:  No  Memory:  Recent;   Good  Judgement:  Fair  Insight:  Fair  Psychomotor Activity:  Normal  Concentration:  Concentration: Fair  Recall:  Fair  Fund of Knowledge:Good  Language: Good  Akathisia:  Negative  Handed:  Right  AIMS (if indicated):  0  Assets:  Communication Skills Desire for Improvement Financial Resources/Insurance Housing Resilience Transportation Vocational/Educational  ADL's:  Intact  Cognition: WNL  Sleep:  Tossing and turning, restless, poor quality    Treatment Plan Summary: Percell BostonMichelle E Ned is a 51 year old female with chronic pain and MDD worsening over the past 6 months with onset of perimenopausal symptoms.  She presents today for med management assessment.  She has had partial improvement with Cymbalta 30  mg, but is frankly underdosed, and I believe she would benefit from titration to 60 mg, and potentially 90 mg in the future.  She does not present any acute safety issues or substance abuse.  She has somewhat of a strained relationship with her boyfriend but there is no concerns of violence or abuse at this time.  She is fairly isolated from her extended family, and is struggling with the recent loss of her mother in April 2018.  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  I am hopeful she will improve with Cymbalta 60 mg and low-dose trazodone for sleep, while continuing in individual therapy.  1. Moderate episode of recurrent major depressive disorder (HCC)   2. Anxiety and depression   3. Chronic pain syndrome     Status of current problems: gradually worsening  Labs Ordered: No orders of the defined types were placed in this encounter.  Plan:  Initiate trazodone 50 mg nightly for sleep Increase Cymbalta to 60 mg daily for depression and pain Return to clinic in 12 weeks  I spent 30 minutes with the patient in direct face-to-face clinical care.  Greater than 50% of this time was spent in counseling and coordination of care with the patient.    Burnard LeighAlexander Arya Eksir, MD 11/2/201812:39 PM

## 2017-08-30 MED FILL — MELOXICAM 7.5 MG TABLET: 7.5 | 30 days supply | Qty: 30 | Fill #4

## 2017-08-30 MED FILL — HYDROCHLOROTHIAZIDE 25 MG T: 25 | 30 days supply | Qty: 30 | Fill #2

## 2017-08-30 MED FILL — ?ESOMEPRAZOLE MAG DR 40MG C: 40 | 30 days supply | Qty: 30 | Fill #3

## 2017-09-01 NOTE — Telephone Encounter (Signed)
Referral was sent on 10/26 to [facility] Dr Janeece RiggersSu Philomena DohenyWooi Teoh ENT will see the patients in the [place] Location Ph# [phone] address [address] across the [facility]. They will contact the patient to schedule an appointment .

## 2017-09-05 ENCOUNTER — Ambulatory Visit (HOSPITAL_COMMUNITY): Payer: Self-pay | Admitting: Licensed Clinical Social Worker

## 2017-09-05 ENCOUNTER — Ambulatory Visit (INDEPENDENT_AMBULATORY_CARE_PROVIDER_SITE_OTHER): Payer: Self-pay | Admitting: Otolaryngology

## 2017-09-05 DIAGNOSIS — H900 Conductive hearing loss, bilateral: Secondary | ICD-10-CM

## 2017-09-05 DIAGNOSIS — J343 Hypertrophy of nasal turbinates: Secondary | ICD-10-CM

## 2017-09-05 DIAGNOSIS — J342 Deviated nasal septum: Secondary | ICD-10-CM

## 2017-09-05 DIAGNOSIS — H6123 Impacted cerumen, bilateral: Secondary | ICD-10-CM

## 2017-09-05 DIAGNOSIS — J310 Chronic rhinitis: Secondary | ICD-10-CM

## 2017-09-05 MED FILL — FLUTICASONE PROP 50 MCG SPR: 50 | 30 days supply | Qty: 16 | Fill #0

## 2017-09-05 MED FILL — IPRATROPIUM 0.06% SPRAY: 0.06 | 30 days supply | Qty: 15 | Fill #0

## 2017-09-13 ENCOUNTER — Other Ambulatory Visit: Payer: Self-pay | Admitting: Internal Medicine

## 2017-09-13 DIAGNOSIS — Z1231 Encounter for screening mammogram for malignant neoplasm of breast: Secondary | ICD-10-CM

## 2017-09-19 ENCOUNTER — Ambulatory Visit (INDEPENDENT_AMBULATORY_CARE_PROVIDER_SITE_OTHER): Payer: No Typology Code available for payment source | Admitting: Licensed Clinical Social Worker

## 2017-09-19 ENCOUNTER — Encounter (HOSPITAL_COMMUNITY): Payer: Self-pay | Admitting: Licensed Clinical Social Worker

## 2017-09-19 DIAGNOSIS — F329 Major depressive disorder, single episode, unspecified: Secondary | ICD-10-CM

## 2017-09-19 DIAGNOSIS — F419 Anxiety disorder, unspecified: Secondary | ICD-10-CM

## 2017-09-19 NOTE — Progress Notes (Signed)
   THERAPIST PROGRESS NOTE  Session Time: 3:10-4pm  Participation Level: Active  Behavioral Response: CasualAlertEuthymic  Type of Therapy: Individual Therapy  Treatment Goals addressed: Diagnosis: Depression/Anxiety  Interventions: Motivational Interviewing and Supportive  Summary: Amy Perez is a 51 y.o. female came in for her individual counseling session. Pt discussed her psychiatric symptoms and current life events. Pt saw Dr. Rene KocherEksir who increased her medication and also prescribed Trazadone for sleep. She reports she is not taking the Trazadone because she is sleeping ok now. Pt brought in her diary where she writes everyday about her stressors, feelings and coping skills. Pt hs become skillful in her descriptions. Pt read some of her journal entries. Pt also brought in Module 1 Depression and reviewed with pt. Pt identified how her depression affects her cognitively, somatically and behaviorally. Taught pt to realize her ability to influence her mood by identifying and changing her thoughts and beliefs. Explained purpose process and practice of mindfulness techniques.    Suicidal/Homicidal: Nowithout intent/plan  Therapist Response: Assessed pt'Perez current functioning and reviewed progress. Assisted pt with her dx of depression and symptoms. Assisted pt with CBT. Assisted pt processing mindfulness practices. Assisted pt processing for the management of her stressors.  Plan: Return again in 2 weeks.  Diagnosis: Axis I: Depression and Anxiety    Amy Perez, LCAS 09/19/2017

## 2017-09-23 ENCOUNTER — Ambulatory Visit: Payer: Self-pay | Attending: Internal Medicine | Admitting: Internal Medicine

## 2017-09-23 ENCOUNTER — Encounter: Payer: Self-pay | Admitting: Internal Medicine

## 2017-09-23 VITALS — BP 135/81 | HR 96 | Temp 98.3°F | Resp 16 | Ht 66.0 in | Wt 286.8 lb

## 2017-09-23 DIAGNOSIS — Z9049 Acquired absence of other specified parts of digestive tract: Secondary | ICD-10-CM | POA: Insufficient documentation

## 2017-09-23 DIAGNOSIS — M7732 Calcaneal spur, left foot: Secondary | ICD-10-CM | POA: Insufficient documentation

## 2017-09-23 DIAGNOSIS — Z1211 Encounter for screening for malignant neoplasm of colon: Secondary | ICD-10-CM

## 2017-09-23 DIAGNOSIS — E538 Deficiency of other specified B group vitamins: Secondary | ICD-10-CM | POA: Insufficient documentation

## 2017-09-23 DIAGNOSIS — J329 Chronic sinusitis, unspecified: Secondary | ICD-10-CM | POA: Insufficient documentation

## 2017-09-23 DIAGNOSIS — I10 Essential (primary) hypertension: Secondary | ICD-10-CM

## 2017-09-23 DIAGNOSIS — F411 Generalized anxiety disorder: Secondary | ICD-10-CM | POA: Insufficient documentation

## 2017-09-23 DIAGNOSIS — Z800 Family history of malignant neoplasm of digestive organs: Secondary | ICD-10-CM | POA: Insufficient documentation

## 2017-09-23 DIAGNOSIS — K219 Gastro-esophageal reflux disease without esophagitis: Secondary | ICD-10-CM | POA: Insufficient documentation

## 2017-09-23 DIAGNOSIS — Z6841 Body Mass Index (BMI) 40.0 and over, adult: Secondary | ICD-10-CM | POA: Insufficient documentation

## 2017-09-23 DIAGNOSIS — F329 Major depressive disorder, single episode, unspecified: Secondary | ICD-10-CM | POA: Insufficient documentation

## 2017-09-23 DIAGNOSIS — Z79899 Other long term (current) drug therapy: Secondary | ICD-10-CM | POA: Insufficient documentation

## 2017-09-23 DIAGNOSIS — F419 Anxiety disorder, unspecified: Secondary | ICD-10-CM

## 2017-09-23 DIAGNOSIS — M7731 Calcaneal spur, right foot: Secondary | ICD-10-CM | POA: Insufficient documentation

## 2017-09-23 DIAGNOSIS — K5901 Slow transit constipation: Secondary | ICD-10-CM

## 2017-09-23 DIAGNOSIS — G5603 Carpal tunnel syndrome, bilateral upper limbs: Secondary | ICD-10-CM

## 2017-09-23 DIAGNOSIS — M1711 Unilateral primary osteoarthritis, right knee: Secondary | ICD-10-CM | POA: Insufficient documentation

## 2017-09-23 MED ORDER — POLYETHYLENE GLYCOL 3350 17 GM/SCOOP PO POWD: 17.0000 g | Freq: Every day | ORAL | 5 refills | Status: DC | PRN

## 2017-09-23 MED FILL — POLYETHYLENE GLYCOL 3350 PO: 14 days supply | Qty: 238 | Fill #0

## 2017-09-23 NOTE — Progress Notes (Signed)
Patient is here for a follow up. Patient wants to talk about menopause. Patient stated that her hands burn like fire and hot flashes. Patient stated that she is having problems with her bowls movement.

## 2017-09-23 NOTE — Patient Instructions (Signed)
Constipation, Adult °Constipation is when a person: °· Poops (has a bowel movement) fewer times in a week than normal. °· Has a hard time pooping. °· Has poop that is dry, hard, or bigger than normal. ° °Follow these instructions at home: °Eating and drinking ° °· Eat foods that have a lot of fiber, such as: °? Fresh fruits and vegetables. °? Whole grains. °? Beans. °· Eat less of foods that are high in fat, low in fiber, or overly processed, such as: °? French fries. °? Hamburgers. °? Cookies. °? Candy. °? Soda. °· Drink enough fluid to keep your pee (urine) clear or pale yellow. °General instructions °· Exercise regularly or as told by your doctor. °· Go to the restroom when you feel like you need to poop. Do not hold it in. °· Take over-the-counter and prescription medicines only as told by your doctor. These include any fiber supplements. °· Do pelvic floor retraining exercises, such as: °? Doing deep breathing while relaxing your lower belly (abdomen). °? Relaxing your pelvic floor while pooping. °· Watch your condition for any changes. °· Keep all follow-up visits as told by your doctor. This is important. °Contact a doctor if: °· You have pain that gets worse. °· You have a fever. °· You have not pooped for 4 days. °· You throw up (vomit). °· You are not hungry. °· You lose weight. °· You are bleeding from the anus. °· You have thin, pencil-like poop (stool). °Get help right away if: °· You have a fever, and your symptoms suddenly get worse. °· You leak poop or have blood in your poop. °· Your belly feels hard or bigger than normal (is bloated). °· You have very bad belly pain. °· You feel dizzy or you faint. °This information is not intended to replace advice given to you by your health care provider. Make sure you discuss any questions you have with your health care provider. °Document Released: 03/29/2008 Document Revised: 04/30/2016 Document Reviewed: 03/31/2016 °Elsevier Interactive Patient Education ©  2017 [employer] ° °

## 2017-09-23 NOTE — Progress Notes (Signed)
Patient ID: Amy Perez, female    DOB: April 25, 1966  MRN: [id number]  CC: Follow-up   Subjective: Amy Perez is a 51 y.o. female who presents for chronic ds management Her concerns today include:  Pt with hx of HTN, GERD, anxiety/depression, bone spurs in heels  1. Chronic Sinusitis:  -had abnormal CT of sinuses. Referred to ENT whom she saw recently in [place]. Started on Flonase Has f/u next mth  2. Anx/Dep: Still feeling depressed.  Seeing therapist at [facility].  Therapy has been helpful -dose Cymbalta increased to 60 mg 5 wks ago. Placed on trazodone but not having to use often  3. C/o numbness, burning and tingling in hands x 2 mths Dx with CTS 1-2 yrs ago. Lost splints RT>>LT. 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 Wakes up at nights with his hands. -dx with Vit B12 def in past  4. BM not regular -stools sometimes hard and sometimes soft Having taken over the stools.  Without taking anything she can go a whole wk without a BM. No blood in the stools.  Family history colon cancer.    Patient Active Problem List   Diagnosis Date Noted  . Primary osteoarthritis of right knee 04/21/2017  . Acute bacterial sinusitis 11/07/2016  . Insomnia 07/02/2016  . Carpal tunnel syndrome 06/29/2016  . Gastroesophageal reflux disease without esophagitis 04/26/2016  . Essential hypertension 04/26/2016  . Generalized anxiety disorder 04/26/2016  . OBESITY, MORBID 03/01/2007     Current Outpatient Medications on File Prior to Visit  Medication Sig Dispense Refill  . DULoxetine (CYMBALTA) 60 MG capsule Take 1 capsule (60 mg total) by mouth daily. 90 capsule 1  . esomeprazole (NEXIUM) 40 MG capsule Take 1 capsule (40 mg total) by mouth daily. 30 capsule 5  . fluticasone (FLONASE) 50 MCG/ACT nasal spray Place 2 sprays into both nostrils daily. 16 g 0  . hydrochlorothiazide (HYDRODIURIL) 25 MG tablet Take 1 tablet (25 mg total) by mouth daily. 90 tablet 1  .  meloxicam (MOBIC) 7.5 MG tablet Take 1 tablet (7.5 mg total) by mouth daily. 30 tablet 5  . Multiple Vitamin (MULTIVITAMIN) tablet Take 1 tablet by mouth daily. Reported on 04/28/2016    . traZODone (DESYREL) 50 MG tablet Take 1 tablet (50 mg total) by mouth at bedtime. 90 tablet 1  . cyclobenzaprine (FLEXERIL) 10 MG tablet Take 1 tablet (10 mg total) by mouth at bedtime. (Patient not taking: Reported on 09/23/2017) 15 tablet 0   No current facility-administered medications on file prior to visit.     Allergies  Allergen Reactions  . Amoxicillin Nausea Only  . Benadryl [Diphenhydramine Hcl] Itching  . Milk-Related Compounds   . Oxycodone-Acetaminophen Nausea And Vomiting  . Penicillins Nausea Only    Social History   Socioeconomic History  . Marital status: Single    Spouse name: Not on file  . Number of children: Not on file  . Years of education: Not on file  . Highest education level: Not on file  Social Needs  . Financial resource strain: Not on file  . Food insecurity - worry: Not on file  . Food insecurity - inability: Not on file  . Transportation needs - medical: Not on file  . Transportation needs - non-medical: Not on file  Occupational History  . Not on file  Tobacco Use  . Smoking status: Never Smoker  . Smokeless tobacco: Never Used  Substance and Sexual Activity  .  Alcohol use: No  . Drug use: No  . Sexual activity: Not on file  Other Topics Concern  . Not on file  Social History Narrative  . Not on file    Family History  Problem Relation Age of Onset  . Hypertension Father [id number]       Deceased  . Heart attack Father   . Hypertension Mother        Living  . Arthritis Mother   . Diabetes Mother   . Dementia Mother   . Arthritis Sister        x2  . Lung cancer Sister        Deceased  . Cancer Other        Paternal & Maternal Aunts  . Heart attack Maternal Grandmother   . Neuropathy Sister   . Heart disease Brother        x1  . Alcoholism  Brother        x2    Past Surgical History:  Procedure Laterality Date  . CHOLECYSTECTOMY    . TUBAL LIGATION  08/29/04  . WISDOM TOOTH EXTRACTION      ROS: Review of Systems Negative except as stated above. PHYSICAL EXAM: BP 135/81 (BP Location: Left Arm, Patient Position: Sitting, Cuff Size: Normal)   Pulse 96   Temp 98.3 F (36.8 C) (Oral)   Resp 16   Ht 5\' 6"  (1.676 m)   Wt 286 lb 12.8 oz (130.1 kg)   SpO2 98%   BMI 46.29 kg/m   Wt Readings from Last 3 Encounters:  09/23/17 286 lb 12.8 oz (130.1 kg)  06/24/17 281 lb (127.5 kg)  04/21/17 277 lb 3.2 oz (125.7 kg)    Physical Exam General appearance - alert, well appearing, obese female and in no distress Mental status - normal mood and affect Nose -nasal mucosa dry. Mild enlargement of nasal turbinates Mouth - mucous membranes moist, pharynx normal without lesions Neck - supple, no significant adenopathy Chest - clear to auscultation, no wheezes, rales or rhonchi, symmetric air entry Heart - normal rate, regular rhythm, normal S1, S2, no murmurs, rubs, clicks or gallops Abd: no tenderness. No ordanomegaly Extremities - peripheral pulses normal, no pedal edema, no clubbing or cyanosis MSK: grip 5/5. Tinel's sign neg   [facility] 09/23/2017 04/21/2017 11/03/2016 11/03/2016  Decreased Interest 2 0 0 0  Down, Depressed, Hopeless 2 1 0 0  PHQ - 2 Score 4 1 0 0  Altered sleeping 2 - - 0  Tired, decreased energy 2 - - 0  Change in appetite 2 - - 0  Feeling bad or failure about yourself  2 - - 0  Trouble concentrating 2 - - 0  Moving slowly or fidgety/restless 2 - - 0  Suicidal thoughts 2 - - 0  PHQ-9 Score 18 - - 0     ASSESSMENT AND PLAN: 1. Essential hypertension -at goal. Cont HCTZ - CBC - Comprehensive metabolic panel - Lipid panel  2. Bilateral carpal tunnel syndrome -advise purchasing pair of wrists splints from any medical supply store and using. If no improvement, we can refer to  ortho - Vitamin B12  3. Slow transit constipation -encourage eating more fiber and drinking water - polyethylene glycol powder (GLYCOLAX/MIRALAX) powder; Take 17 g by mouth daily as needed.  Dispense: 3350 g; Refill: 5  4. Anxiety and depression -in care with a MH provider  5. Colon cancer screening - Ambulatory referral to Gastroenterology  Patient was  given the opportunity to ask questions.  Patient verbalized understanding of the plan and was able to repeat key elements of the plan.   No orders of the defined types were placed in this encounter.    Requested Prescriptions    No prescriptions requested or ordered in this encounter    F/u in 3 mths Jonah Blueeborah Marshayla Mitschke, MD, Jerrel IvoryFACP

## 2017-09-24 LAB — LIPID PANEL
CHOLESTEROL TOTAL: 245 mg/dL — AB (ref 100–199)
Chol/HDL Ratio: 4.8 ratio — ABNORMAL HIGH (ref 0.0–4.4)
HDL: 51 mg/dL (ref >39)
LDL Calculated: 169 mg/dL — ABNORMAL HIGH (ref 0–99)
TRIGLYCERIDES: 124 mg/dL (ref 0–149)
VLDL CHOLESTEROL CAL: 25 mg/dL (ref 5–40)

## 2017-09-24 LAB — COMPREHENSIVE METABOLIC PANEL
ALK PHOS: 78 IU/L (ref 39–117)
ALT: 17 IU/L (ref 0–32)
AST: 21 IU/L (ref 0–40)
Albumin/Globulin Ratio: 1.2 (ref 1.2–2.2)
Albumin: 4.1 g/dL (ref 3.5–5.5)
BILIRUBIN TOTAL: 0.2 mg/dL (ref 0.0–1.2)
BUN/Creatinine Ratio: 9 (ref 9–23)
BUN: 8 mg/dL (ref 6–24)
CHLORIDE: 103 mmol/L (ref 96–106)
CO2: 28 mmol/L (ref 20–29)
Calcium: 9.4 mg/dL (ref 8.7–10.2)
Creatinine, Ser: 0.89 mg/dL (ref 0.57–1.00)
GFR calc non Af Amer: 76 mL/min/{1.73_m2} (ref >59)
GFR, EST AFRICAN AMERICAN: 87 mL/min/{1.73_m2} (ref >59)
GLUCOSE: 93 mg/dL (ref 65–99)
Globulin, Total: 3.5 g/dL (ref 1.5–4.5)
Potassium: 3.5 mmol/L (ref 3.5–5.2)
Sodium: 144 mmol/L (ref 134–144)
TOTAL PROTEIN: 7.6 g/dL (ref 6.0–8.5)

## 2017-09-24 LAB — CBC
Hematocrit: 36.9 % (ref 34.0–46.6)
Hemoglobin: 12.6 g/dL (ref 11.1–15.9)
MCH: 25.6 pg — AB (ref 26.6–33.0)
MCHC: 34.1 g/dL (ref 31.5–35.7)
MCV: 75 fL — ABNORMAL LOW (ref 79–97)
Platelets: 375 10*3/uL (ref 150–379)
RBC: 4.92 x10E6/uL (ref 3.77–5.28)
RDW: 17.2 % — AB (ref 12.3–15.4)
WBC: 5.9 10*3/uL (ref 3.4–10.8)

## 2017-09-24 LAB — VITAMIN B12: Vitamin B-12: 1379 pg/mL — ABNORMAL HIGH (ref 232–1245)

## 2017-09-25 ENCOUNTER — Other Ambulatory Visit: Payer: Self-pay | Admitting: Internal Medicine

## 2017-09-25 MED ORDER — ATORVASTATIN CALCIUM 10 MG PO TABS: 10.0000 mg | ORAL_TABLET | Freq: Every day | ORAL | 3 refills | Status: DC

## 2017-09-26 MED FILL — ?ATORVASTATIN 10 MG TABLET: 10 | 30 days supply | Qty: 30 | Fill #0

## 2017-09-28 ENCOUNTER — Telehealth: Payer: Self-pay | Admitting: Internal Medicine

## 2017-09-28 NOTE — Telephone Encounter (Signed)
Patient called asking for lab results. Please fu

## 2017-09-29 NOTE — Telephone Encounter (Signed)
-----   Message from Marcine Matareborah B Johnson, MD sent at 09/25/2017 12:48 PM EST ----- Let pt know that her blood count is stable. Kidney and LFT are normal. Vit B 12 level normal.  Total and LDL cholesterol elevated. I recommend healthy eating habits and regular aerobic exercise 3-4 x a wk for 30 mins.  I also starting a  med to help lower cholesterol called Lipitor. Med sent to our pharmacy.

## 2017-09-29 NOTE — Telephone Encounter (Signed)
Patient verified DOB Patient is aware of blood count being stable and kidney and b12 being normal Patient is aware of Lipitor being sent to the pharmacy for pick up to address elevated cholesterol. No further questions at this time.

## 2017-10-03 ENCOUNTER — Other Ambulatory Visit: Payer: Self-pay | Admitting: Internal Medicine

## 2017-10-03 ENCOUNTER — Ambulatory Visit (HOSPITAL_COMMUNITY): Payer: Self-pay | Admitting: Licensed Clinical Social Worker

## 2017-10-03 DIAGNOSIS — M1711 Unilateral primary osteoarthritis, right knee: Secondary | ICD-10-CM

## 2017-10-03 MED FILL — MELOXICAM 7.5 MG TABLET: 7.5 | 30 days supply | Qty: 30 | Fill #5

## 2017-10-03 MED FILL — ?ESOMEPRAZOLE MAG DR 40MG C: 40 | 30 days supply | Qty: 30 | Fill #4

## 2017-10-03 MED FILL — ?DULOXETINE HCL 60 MG CPEP: 60 | 30 days supply | Qty: 30 | Fill #1

## 2017-10-05 ENCOUNTER — Telehealth: Payer: Self-pay | Admitting: Internal Medicine

## 2017-10-05 NOTE — Telephone Encounter (Signed)
Patient called and said that the duloxetine is making her back hurt. Please fu with patient. Thank you.

## 2017-10-06 NOTE — Telephone Encounter (Signed)
Patient verified DOB Patient states she stopped the medication for 5 days and began again today. She took the medication with cereal and has not experienced any discomfort. Patient would like to continue with medication and contact the office if any other concerns arise. No further questions

## 2017-10-12 ENCOUNTER — Encounter: Payer: Self-pay | Admitting: Gastroenterology

## 2017-10-13 ENCOUNTER — Ambulatory Visit
Admission: RE | Admit: 2017-10-13 | Discharge: 2017-10-13 | Disposition: A | Discharge status: Home / Self Care | Payer: No Typology Code available for payment source | Source: Ambulatory Visit | Attending: Internal Medicine | Admitting: Internal Medicine

## 2017-10-13 DIAGNOSIS — Z1231 Encounter for screening mammogram for malignant neoplasm of breast: Secondary | ICD-10-CM

## 2017-10-20 ENCOUNTER — Ambulatory Visit (INDEPENDENT_AMBULATORY_CARE_PROVIDER_SITE_OTHER): Payer: Self-pay | Admitting: Otolaryngology

## 2017-10-20 DIAGNOSIS — J343 Hypertrophy of nasal turbinates: Secondary | ICD-10-CM

## 2017-10-20 DIAGNOSIS — J342 Deviated nasal septum: Secondary | ICD-10-CM

## 2017-10-20 DIAGNOSIS — J310 Chronic rhinitis: Secondary | ICD-10-CM

## 2017-10-24 ENCOUNTER — Ambulatory Visit (HOSPITAL_COMMUNITY): Payer: Self-pay | Admitting: Licensed Clinical Social Worker

## 2017-10-28 ENCOUNTER — Other Ambulatory Visit: Payer: Self-pay | Admitting: Internal Medicine

## 2017-10-28 ENCOUNTER — Other Ambulatory Visit: Payer: Self-pay | Admitting: Family Medicine

## 2017-10-28 DIAGNOSIS — I10 Essential (primary) hypertension: Secondary | ICD-10-CM

## 2017-10-28 DIAGNOSIS — M1711 Unilateral primary osteoarthritis, right knee: Secondary | ICD-10-CM

## 2017-10-28 MED FILL — ?DULOXETINE HCL 60 MG CPEP: 60 | 30 days supply | Qty: 30 | Fill #2

## 2017-10-28 MED FILL — ?ESOMEPRAZOLE MAG DR 40MG C: 40 | 30 days supply | Qty: 30 | Fill #5

## 2017-10-28 MED FILL — ?ATORVASTATIN 10 MG TABLET: 10 | 30 days supply | Qty: 30 | Fill #1

## 2017-10-28 MED FILL — HYDROCHLOROTHIAZIDE 25 MG T: 25 | 30 days supply | Qty: 30 | Fill #3

## 2017-10-31 MED FILL — ?PANTOPRAZOLE SOD DR 40MG: 40 MG | 30 days supply | Qty: 30 | Fill #0

## 2017-11-02 MED FILL — MELOXICAM 7.5 MG TABLET: 7.5 | 30 days supply | Qty: 30 | Fill #0

## 2017-11-03 ENCOUNTER — Ambulatory Visit (HOSPITAL_COMMUNITY): Payer: Self-pay | Admitting: Licensed Clinical Social Worker

## 2017-11-08 ENCOUNTER — Ambulatory Visit: Payer: Self-pay | Attending: Internal Medicine | Admitting: Internal Medicine

## 2017-11-08 ENCOUNTER — Encounter: Payer: Self-pay | Admitting: Internal Medicine

## 2017-11-08 VITALS — BP 142/96 | HR 89 | Temp 98.2°F | Resp 16 | Wt 287.2 lb

## 2017-11-08 DIAGNOSIS — Z888 Allergy status to other drugs, medicaments and biological substances status: Secondary | ICD-10-CM | POA: Insufficient documentation

## 2017-11-08 DIAGNOSIS — Z7951 Long term (current) use of inhaled steroids: Secondary | ICD-10-CM | POA: Insufficient documentation

## 2017-11-08 DIAGNOSIS — J069 Acute upper respiratory infection, unspecified: Secondary | ICD-10-CM | POA: Insufficient documentation

## 2017-11-08 DIAGNOSIS — I10 Essential (primary) hypertension: Secondary | ICD-10-CM | POA: Insufficient documentation

## 2017-11-08 DIAGNOSIS — K219 Gastro-esophageal reflux disease without esophagitis: Secondary | ICD-10-CM | POA: Insufficient documentation

## 2017-11-08 DIAGNOSIS — Z6841 Body Mass Index (BMI) 40.0 and over, adult: Secondary | ICD-10-CM | POA: Insufficient documentation

## 2017-11-08 DIAGNOSIS — N951 Menopausal and female climacteric states: Secondary | ICD-10-CM | POA: Insufficient documentation

## 2017-11-08 DIAGNOSIS — M1711 Unilateral primary osteoarthritis, right knee: Secondary | ICD-10-CM | POA: Insufficient documentation

## 2017-11-08 DIAGNOSIS — Z885 Allergy status to narcotic agent status: Secondary | ICD-10-CM | POA: Insufficient documentation

## 2017-11-08 DIAGNOSIS — R232 Flushing: Secondary | ICD-10-CM

## 2017-11-08 DIAGNOSIS — Z880 Allergy status to penicillin: Secondary | ICD-10-CM | POA: Insufficient documentation

## 2017-11-08 DIAGNOSIS — Z79899 Other long term (current) drug therapy: Secondary | ICD-10-CM | POA: Insufficient documentation

## 2017-11-08 DIAGNOSIS — F411 Generalized anxiety disorder: Secondary | ICD-10-CM | POA: Insufficient documentation

## 2017-11-08 MED ORDER — GABAPENTIN 300 MG PO CAPS: 300.0000 mg | ORAL_CAPSULE | Freq: Every day | ORAL | 3 refills | Status: DC

## 2017-11-08 MED ORDER — LORATADINE 10 MG PO TABS: 10.0000 mg | ORAL_TABLET | Freq: Every day | ORAL | 2 refills | Status: DC

## 2017-11-08 MED FILL — GABAPENTIN 300 MG CAPSULE: 300 | 30 days supply | Qty: 30 | Fill #0

## 2017-11-08 NOTE — Progress Notes (Signed)
Patient ID: Amy Perez, female    DOB: 17-Jul-1966  MRN: [id number]  CC:   Subjective:  Amy Perez is a 52 y.o. female who presents for UC visit. Her concerns today include:  1.  Pt c/o having a cold or sinus infection x 2 wks -c/o cough sometimes productive of yellow phlegm, nasal congestion, rhinorrhea.  Some throat irritation. No fever or SOB.  -Tried Alkaseltzer, Mucinex DM  2.  C/o frequent hot flashes.   No Menses since 12/2016.   3.  BP elevated.  Did not take medication as yet for the morning. Patient Active Problem List   Diagnosis Date Noted  . Primary osteoarthritis of right knee 04/21/2017  . Acute bacterial sinusitis 11/07/2016  . Insomnia 07/02/2016  . Carpal tunnel syndrome 06/29/2016  . Gastroesophageal reflux disease without esophagitis 04/26/2016  . Essential hypertension 04/26/2016  . Generalized anxiety disorder 04/26/2016  . OBESITY, MORBID 03/01/2007     Current Outpatient Medications on File Prior to Visit  Medication Sig Dispense Refill  . atorvastatin (LIPITOR) 10 MG tablet Take 1 tablet (10 mg total) by mouth daily. 90 tablet 3  . DULoxetine (CYMBALTA) 60 MG capsule Take 1 capsule (60 mg total) by mouth daily. 90 capsule 1  . esomeprazole (NEXIUM) 40 MG capsule TAKE 1 CAPSULE BY MOUTH DAILY. 30 capsule 2  . fluticasone (FLONASE) 50 MCG/ACT nasal spray Place 2 sprays into both nostrils daily. 16 g 0  . hydrochlorothiazide (HYDRODIURIL) 25 MG tablet TAKE 1 TABLET BY MOUTH DAILY. 90 tablet 0  . meloxicam (MOBIC) 7.5 MG tablet TAKE 1 TABLET BY MOUTH DAILY. 30 tablet 0  . Multiple Vitamin (MULTIVITAMIN) tablet Take 1 tablet by mouth daily. Reported on 04/28/2016    . pantoprazole (PROTONIX) 40 MG tablet TAKE 1 TABLET (40 MG TOTAL) BY MOUTH DAILY. 30 tablet 6  . polyethylene glycol powder (GLYCOLAX/MIRALAX) powder Take 17 g by mouth daily as needed. 3350 g 5  . cyclobenzaprine (FLEXERIL) 10 MG tablet Take 1 tablet (10 mg total) by mouth at  bedtime. (Patient not taking: Reported on 09/23/2017) 15 tablet 0  . traZODone (DESYREL) 50 MG tablet Take 1 tablet (50 mg total) by mouth at bedtime. (Patient not taking: Reported on 11/08/2017) 90 tablet 1   No current facility-administered medications on file prior to visit.     Allergies  Allergen Reactions  . Amoxicillin Nausea Only  . Benadryl [Diphenhydramine Hcl] Itching  . Milk-Related Compounds   . Oxycodone-Acetaminophen Nausea And Vomiting  . Penicillins Nausea Only     ROS: Review of Systems Neg except as stated above  PHYSICAL EXAM: BP (!) 142/96   Pulse 89   Temp 98.2 F (36.8 C) (Oral)   Resp 16   Wt 287 lb 3.2 oz (130.3 kg)   SpO2 99%   BMI 46.36 kg/m   Physical Exam  General appearance - alert, well appearing, and in no distress.  Mild audible congestion. Mental status - alert, oriented to person, place, and time, normal mood, behavior, speech, dress, motor activity, and thought processes Nose - normal and patent, no erythema, discharge or polyps Mouth - mucous membranes moist, pharynx normal without lesions Neck - supple, no significant adenopathy Chest - clear to auscultation, no wheezes, rales or rhonchi, symmetric air entry Heart - normal rate, regular rhythm, normal S1, S2, no murmurs, rubs, clicks or gallops  ASSESSMENT AND PLAN: 1. Viral upper respiratory tract infection -Continue Flonase.  Add Claritin - loratadine (CLARITIN) 10 MG tablet;  Take 1 tablet (10 mg total) by mouth daily.  Dispense: 30 tablet; Refill: 2  2. Hot flashes -Discussed treatment options including HRT.  Patient would like to try a nonhormonal therapy.  Willing to try low dose Neurontin at bedtime. - gabapentin (NEURONTIN) 300 MG capsule; Take 1 capsule (300 mg total) by mouth at bedtime.  Dispense: 30 capsule; Refill: 3  Patient was given the opportunity to ask questions.  Patient verbalized understanding of the plan and was able to repeat key elements of the plan.   No  orders of the defined types were placed in this encounter.    Requested Prescriptions   Signed Prescriptions Disp Refills  . loratadine (CLARITIN) 10 MG tablet 30 tablet 2    Sig: Take 1 tablet (10 mg total) by mouth daily.  Marland Kitchen gabapentin (NEURONTIN) 300 MG capsule 30 capsule 3    Sig: Take 1 capsule (300 mg total) by mouth at bedtime.    Future Appointments  Date Time Provider Department Center  11/14/2017  1:00 PM Vernona Rieger, [place] [facility] None  11/18/2017  2:30 PM [facility] FINANCIAL COUNSELOR [facility] None  11/22/2017  3:00 PM Rene Kocher Bo Mcclintock, MD [facility] None  12/08/2017 10:30 AM Anice Paganini, NP [facility]  12/23/2017  1:45 PM Marcine Matar, MD [facility] None    Jonah Blue, MD, Jerrel Ivory

## 2017-11-08 NOTE — Patient Instructions (Signed)
Perimenopause Perimenopause is the time when your body begins to move into the menopause (no menstrual period for 12 straight months). It is a natural process. Perimenopause can begin 2-8 years before the menopause and usually lasts for 1 year after the menopause. During this time, your ovaries may or may not produce an egg. The ovaries vary in their production of estrogen and progesterone hormones each month. This can cause irregular menstrual periods, difficulty getting pregnant, vaginal bleeding between periods, and uncomfortable symptoms. What are the causes?  Irregular production of the ovarian hormones, estrogen and progesterone, and not ovulating every month. Other causes include:  Tumor of the pituitary gland in the brain.  Medical disease that affects the ovaries.  Radiation treatment.  Chemotherapy.  Unknown causes.  Heavy smoking and excessive alcohol intake can bring on perimenopause sooner. What are the signs or symptoms?  Hot flashes.  Night sweats.  Irregular menstrual periods.  Decreased sex drive.  Vaginal dryness.  Headaches.  Mood swings.  Depression.  Memory problems.  Irritability.  Tiredness.  Weight gain.  Trouble getting pregnant.  The beginning of losing bone cells (osteoporosis).  The beginning of hardening of the arteries (atherosclerosis). How is this diagnosed? Your health care provider will make a diagnosis by analyzing your age, menstrual history, and symptoms. He or she will do a physical exam and note any changes in your body, especially your female organs. Female hormone tests may or may not be helpful depending on the amount of female hormones you produce and when you produce them. However, other hormone tests may be helpful to rule out other problems. How is this treated? In some cases, no treatment is needed. The decision on whether treatment is necessary during the perimenopause should be made by you and your health care  provider based on how the symptoms are affecting you and your lifestyle. Various treatments are available, such as:  Treating individual symptoms with a specific medicine for that symptom.  Herbal medicines that can help specific symptoms.  Counseling.  Group therapy. Follow these instructions at home:  Keep track of your menstrual periods (when they occur, how heavy they are, how long between periods, and how long they last) as well as your symptoms and when they started.  Only take over-the-counter or prescription medicines as directed by your health care provider.  Sleep and rest.  Exercise.  Eat a diet that contains calcium (good for your bones) and soy (acts like the estrogen hormone).  Do not smoke.  Avoid alcoholic beverages.  Take vitamin supplements as recommended by your health care provider. Taking vitamin E may help in certain cases.  Take calcium and vitamin D supplements to help prevent bone loss.  Group therapy is sometimes helpful.  Acupuncture may help in some cases. Contact a health care provider if:  You have questions about any symptoms you are having.  You need a referral to a specialist (gynecologist, psychiatrist, or psychologist). Get help right away if:  You have vaginal bleeding.  Your period lasts longer than 8 days.  Your periods are recurring sooner than 21 days.  You have bleeding after intercourse.  You have severe depression.  You have pain when you urinate.  You have severe headaches.  You have vision problems. This information is not intended to replace advice given to you by your health care provider. Make sure you discuss any questions you have with your health care provider. Document Released: 11/18/2004 Document Revised: 03/18/2016 Document Reviewed: 05/10/2013 Elsevier Interactive   Patient Education  2017 Elsevier Inc.  

## 2017-11-08 NOTE — Progress Notes (Signed)
Pt sympotms are: -sweating -coughing -sore throat -sinus pressure  Pt states she thinks she is going through menopause because of the sweating

## 2017-11-14 ENCOUNTER — Ambulatory Visit (HOSPITAL_COMMUNITY): Payer: Self-pay | Admitting: Licensed Clinical Social Worker

## 2017-11-18 ENCOUNTER — Ambulatory Visit: Payer: Self-pay | Attending: Internal Medicine

## 2017-11-22 ENCOUNTER — Ambulatory Visit (HOSPITAL_COMMUNITY): Payer: Self-pay | Admitting: Psychiatry

## 2017-11-30 ENCOUNTER — Other Ambulatory Visit: Payer: Self-pay | Admitting: Internal Medicine

## 2017-11-30 DIAGNOSIS — M1711 Unilateral primary osteoarthritis, right knee: Secondary | ICD-10-CM

## 2017-11-30 MED FILL — ?PANTOPRAZOLE SOD DR 40MG: 40 MG | 30 days supply | Qty: 30 | Fill #1

## 2017-11-30 MED FILL — MELOXICAM 7.5 MG TABLET: 7.5 | 30 days supply | Qty: 30 | Fill #0

## 2017-11-30 MED FILL — HYDROCHLOROTHIAZIDE 25 MG T: 25 | 30 days supply | Qty: 30 | Fill #0

## 2017-11-30 MED FILL — ?ATORVASTATIN 10 MG TABLET: 10 | 30 days supply | Qty: 30 | Fill #2

## 2017-11-30 MED FILL — !CYMBALTA 60MG CAPSULE: 60 | 30 days supply | Qty: 30 | Fill #3

## 2017-12-08 ENCOUNTER — Other Ambulatory Visit: Payer: Self-pay

## 2017-12-08 ENCOUNTER — Ambulatory Visit (INDEPENDENT_AMBULATORY_CARE_PROVIDER_SITE_OTHER): Payer: Self-pay | Admitting: Nurse Practitioner

## 2017-12-08 ENCOUNTER — Other Ambulatory Visit: Payer: Self-pay | Admitting: *Deleted

## 2017-12-08 ENCOUNTER — Encounter: Payer: Self-pay | Admitting: *Deleted

## 2017-12-08 ENCOUNTER — Encounter: Payer: Self-pay | Admitting: Nurse Practitioner

## 2017-12-08 DIAGNOSIS — Z1211 Encounter for screening for malignant neoplasm of colon: Secondary | ICD-10-CM

## 2017-12-08 DIAGNOSIS — R69 Illness, unspecified: Secondary | ICD-10-CM

## 2017-12-08 DIAGNOSIS — K5900 Constipation, unspecified: Secondary | ICD-10-CM | POA: Insufficient documentation

## 2017-12-08 MED ORDER — PEG 3350-KCL-NA BICARB-NACL 420 G PO SOLR: 4000.0000 mL | Freq: Once | ORAL | 0 refills | Status: AC

## 2017-12-08 MED FILL — GOLYTELY SOLUTION: 236 | 1 days supply | Qty: 4000 | Fill #0

## 2017-12-08 NOTE — Assessment & Plan Note (Signed)
The patient was referred for triage of colonoscopy but was deferred to office visit due to complaints of constipation.  The patient describes significant constipation with a bowel movement only 2-3 times a week.  She does take MiraLAX intermittently to help.  Noted hard stools with straining if she has not taken MiraLAX, some improved stool consistency if she has taking MiraLAX or another over-the-counter laxative.  No overt hematochezia.  No other red flag/warning signs or symptoms.  At this point I will trial her on Linzess 72 mcg daily.  I will provide samples to last 1-2 weeks.  I have requested a progress report in 1-2 weeks.  We will schedule her for colonoscopy as she has never had one before.  Return for follow-up in 3 months.  Proceed with TCS on propofol/MAC with Dr. Gala Romney in near future: the risks, benefits, and alternatives have been discussed with the patient in detail. The patient states understanding and desires to proceed.  The patient is a large body habitus.  She is on Flexeril, Cymbalta, Neurontin.  No other anticoagulants, anxiolytics, chronic pain medications, or antidepressants.  We will plan for the procedure on propofol/MAC to promote adequate sedation.

## 2017-12-08 NOTE — Progress Notes (Signed)
Primary Care Physician:  Marcine MatarJohnson, Deborah B, MD Primary Gastroenterologist:  Dr. Jena Gaussourk  Chief Complaint  Patient presents with  . Constipation  . Colonoscopy    consult, never had tcs  . Gas    HPI:   Amy Perez is a 52 y.o. female who presents to triage for colonoscopy.  The patient was brought into our office rather than the phone triage due to complaints of constipation and gas.  It was missed at the patient was seen by [facility] our GI approximately 1 year ago [place]  No history of colonoscopy in our system.  Today she states she was seen by [facility] a year ago. [facility] and they referred [place] to [place]. Admits abdominal pain in her lower abdomen described as dull with often resolves with a bowel movement. Has a bowel movement about 2-3 times a week. Takes MiraLAX to help, intermittently. Has hard stools with straining unless she takes OTC laxative. Denies hematochezia, melena, fever, chills, unintentional weight loss. Denies chest pain, dyspnea, dizziness, lightheadedness, syncope, near syncope. Denies any other upper or lower GI symptoms.  Past Medical History:  Diagnosis Date  . AC (acromioclavicular) joint bone spurs   . Acid reflux   . Anxiety   . CTS (carpal tunnel syndrome)    Bilateral  . Gastric ulcer   . GERD (gastroesophageal reflux disease)   . Heel spur   . History of chicken pox   . Hypertension   . Panic attacks   . Plantar fasciitis   . Tendonitis    Feet    Past Surgical History:  Procedure Laterality Date  . CHOLECYSTECTOMY    . TUBAL LIGATION  08/29/04  . WISDOM TOOTH EXTRACTION      Current Outpatient Medications  Medication Sig Dispense Refill  . atorvastatin (LIPITOR) 10 MG tablet Take 1 tablet (10 mg total) by mouth daily. 90 tablet 3  . cyclobenzaprine (FLEXERIL) 10 MG tablet Take 1 tablet (10 mg total) by mouth at bedtime. 15 tablet 0  .  DULoxetine (CYMBALTA) 60 MG capsule Take 1 capsule (60 mg total) by mouth daily. 90 capsule 1  . esomeprazole (NEXIUM) 40 MG capsule TAKE 1 CAPSULE BY MOUTH DAILY. 30 capsule 2  . fluticasone (FLONASE) 50 MCG/ACT nasal spray Place 2 sprays into both nostrils daily. 16 g 0  . gabapentin (NEURONTIN) 300 MG capsule Take 1 capsule (300 mg total) by mouth at bedtime. 30 capsule 3  . hydrochlorothiazide (HYDRODIURIL) 25 MG tablet TAKE 1 TABLET BY MOUTH DAILY. 90 tablet 0  . loratadine (CLARITIN) 10 MG tablet Take 1 tablet (10 mg total) by mouth daily. 30 tablet 2  . meloxicam (MOBIC) 7.5 MG tablet TAKE 1 TABLET BY MOUTH DAILY. 30 tablet 0  . Multiple Vitamin (MULTIVITAMIN) tablet Take 1 tablet by mouth daily. Reported on 04/28/2016    . pantoprazole (PROTONIX) 40 MG tablet TAKE 1 TABLET (40 MG TOTAL) BY MOUTH DAILY. 30 tablet 6  . polyethylene glycol powder (GLYCOLAX/MIRALAX) powder Take 17 g by mouth daily as needed. 3350 g 5   No current facility-administered medications for this visit.     Allergies as of 12/08/2017 - Review Complete 12/08/2017  Allergen Reaction Noted  . Amoxicillin Nausea Only 12/15/2013  . Benadryl [diphenhydramine hcl] Itching 11/06/2012  . Milk-related compounds  09/22/2016  . Oxycodone-acetaminophen Nausea And Vomiting 02/27/2007  . Penicillins Nausea Only 02/27/2007  Family History  Problem Relation Age of Onset  . Hypertension Father 66       Deceased  . Heart attack Father   . Hypertension Mother        Living  . Arthritis Mother   . Diabetes Mother   . Dementia Mother   . Arthritis Sister        x2  . Lung cancer Sister        Deceased  . Cancer Other        Paternal & Maternal Aunts  . Heart attack Maternal Grandmother   . Neuropathy Sister   . Heart disease Brother        x1  . Alcoholism Brother        x2  . Colon cancer Neg Hx     Social History   Socioeconomic History  . Marital status: Single    Spouse name: Not on file  . Number of  children: Not on file  . Years of education: Not on file  . Highest education level: Not on file  Social Needs  . Financial resource strain: Not on file  . Food insecurity - worry: Not on file  . Food insecurity - inability: Not on file  . Transportation needs - medical: Not on file  . Transportation needs - non-medical: Not on file  Occupational History  . Not on file  Tobacco Use  . Smoking status: Never Smoker  . Smokeless tobacco: Never Used  Substance and Sexual Activity  . Alcohol use: No  . Drug use: No  . Sexual activity: Not on file  Other Topics Concern  . Not on file  Social History Narrative  . Not on file    Review of Systems: General: Negative for anorexia, weight loss, fever, chills, fatigue, weakness. ENT: Negative for hoarseness, difficulty swallowing. CV: Negative for chest pain, angina, palpitations, peripheral edema.  Respiratory: Negative for dyspnea at rest, cough, sputum, wheezing.  GI: See history of present illness.  MS: Negative for joint pain, low back pain.  Derm: Negative for rash or itching.  Endo: Negative for unusual weight change.  Heme: Negative for bruising or bleeding. Allergy: Negative for rash or hives.    Physical Exam: BP (!) 157/95   Pulse 92   Temp (!) 97.5 F (36.4 C) (Oral)   Ht 5\' 6"  (1.676 m)   Wt 277 lb 3.2 oz (125.7 kg)   LMP 12/05/2017 (Exact Date)   BMI 44.74 kg/m  General:   Alert and oriented. Pleasant and cooperative. Well-nourished and well-developed.  Head:  Normocephalic and atraumatic. Eyes:  Without icterus, sclera clear and conjunctiva pink.  Ears:  Normal auditory acuity. Cardiovascular:  S1, S2 present without murmurs appreciated. Extremities without clubbing or edema. Respiratory:  Clear to auscultation bilaterally. No wheezes, rales, or rhonchi. No distress.  Gastrointestinal:  +BS, rounded but soft, non-tender and non-distended. No HSM noted. No guarding or rebound. No masses appreciated.  Rectal:   Deferred  Musculoskalatal:  Symmetrical without gross deformities. Normal posture. Neurologic:  Alert and oriented x4;  grossly normal neurologically. Psych:  Alert and cooperative. Normal mood and affect. Heme/Lymph/Immune: No excessive bruising noted.    12/08/2017 11:27 AM   Disclaimer: This note was dictated with voice recognition software. Similar sounding words can inadvertently be transcribed and may not be corrected upon review.

## 2017-12-08 NOTE — Patient Instructions (Signed)
1. I am giving you samples of Linzess 72 mcg to last 1 or 2 weeks.  Take this once a day, on an empty stomach. 2. You may have some diarrhea from this which typically resolves in 3-5 days. 3. Call us in 1-2 weeks and let us know if the medication is working for you. 4. We will schedule your procedure for you. 5. Further recommendations will be made based on the results of your procedure. 6. Return for follow-up in 3 months. 7. Call us if you have any questions or concerns.

## 2017-12-08 NOTE — Progress Notes (Signed)
CC'D TO PCP °

## 2017-12-08 NOTE — Assessment & Plan Note (Signed)
Patient is on chronic medications.  She has been referred for colonoscopy.  Office visit was elected and phone triage deferred due to complaints of constipation.  Due to her multiple medications that can affect sedation efficacy we will plan for propofol/MAC as per above.

## 2017-12-09 ENCOUNTER — Encounter: Payer: Self-pay | Admitting: *Deleted

## 2017-12-09 ENCOUNTER — Telehealth: Payer: Self-pay | Admitting: *Deleted

## 2017-12-09 NOTE — Telephone Encounter (Signed)
ATC pt but VM full. Pre-op scheduled for 01/02/18 at 11:00am. Letter mailed

## 2017-12-23 ENCOUNTER — Ambulatory Visit: Payer: Self-pay | Attending: Internal Medicine | Admitting: Internal Medicine

## 2017-12-23 ENCOUNTER — Encounter: Payer: Self-pay | Admitting: Internal Medicine

## 2017-12-23 VITALS — BP 121/81 | HR 89 | Temp 98.7°F | Resp 16 | Ht 66.0 in | Wt 279.2 lb

## 2017-12-23 DIAGNOSIS — F32A Depression, unspecified: Secondary | ICD-10-CM

## 2017-12-23 DIAGNOSIS — G5600 Carpal tunnel syndrome, unspecified upper limb: Secondary | ICD-10-CM | POA: Insufficient documentation

## 2017-12-23 DIAGNOSIS — Z91011 Allergy to milk products: Secondary | ICD-10-CM | POA: Insufficient documentation

## 2017-12-23 DIAGNOSIS — Z9889 Other specified postprocedural states: Secondary | ICD-10-CM | POA: Insufficient documentation

## 2017-12-23 DIAGNOSIS — Z8249 Family history of ischemic heart disease and other diseases of the circulatory system: Secondary | ICD-10-CM | POA: Insufficient documentation

## 2017-12-23 DIAGNOSIS — Z820 Family history of epilepsy and other diseases of the nervous system: Secondary | ICD-10-CM | POA: Insufficient documentation

## 2017-12-23 DIAGNOSIS — Z833 Family history of diabetes mellitus: Secondary | ICD-10-CM | POA: Insufficient documentation

## 2017-12-23 DIAGNOSIS — J329 Chronic sinusitis, unspecified: Secondary | ICD-10-CM

## 2017-12-23 DIAGNOSIS — M1711 Unilateral primary osteoarthritis, right knee: Secondary | ICD-10-CM | POA: Insufficient documentation

## 2017-12-23 DIAGNOSIS — K219 Gastro-esophageal reflux disease without esophagitis: Secondary | ICD-10-CM | POA: Insufficient documentation

## 2017-12-23 DIAGNOSIS — Z9049 Acquired absence of other specified parts of digestive tract: Secondary | ICD-10-CM | POA: Insufficient documentation

## 2017-12-23 DIAGNOSIS — Z79899 Other long term (current) drug therapy: Secondary | ICD-10-CM | POA: Insufficient documentation

## 2017-12-23 DIAGNOSIS — Z880 Allergy status to penicillin: Secondary | ICD-10-CM | POA: Insufficient documentation

## 2017-12-23 DIAGNOSIS — I10 Essential (primary) hypertension: Secondary | ICD-10-CM

## 2017-12-23 DIAGNOSIS — Z885 Allergy status to narcotic agent status: Secondary | ICD-10-CM | POA: Insufficient documentation

## 2017-12-23 DIAGNOSIS — Z888 Allergy status to other drugs, medicaments and biological substances status: Secondary | ICD-10-CM | POA: Insufficient documentation

## 2017-12-23 DIAGNOSIS — Z8261 Family history of arthritis: Secondary | ICD-10-CM | POA: Insufficient documentation

## 2017-12-23 DIAGNOSIS — F419 Anxiety disorder, unspecified: Secondary | ICD-10-CM

## 2017-12-23 DIAGNOSIS — Z791 Long term (current) use of non-steroidal anti-inflammatories (NSAID): Secondary | ICD-10-CM | POA: Insufficient documentation

## 2017-12-23 DIAGNOSIS — K5900 Constipation, unspecified: Secondary | ICD-10-CM | POA: Insufficient documentation

## 2017-12-23 DIAGNOSIS — Z801 Family history of malignant neoplasm of trachea, bronchus and lung: Secondary | ICD-10-CM | POA: Insufficient documentation

## 2017-12-23 DIAGNOSIS — F329 Major depressive disorder, single episode, unspecified: Secondary | ICD-10-CM | POA: Insufficient documentation

## 2017-12-23 NOTE — Patient Instructions (Signed)
Try to do something every day for 1 hour that relaxes you and brings you joy.  This can include going for a walk, meditating, meeting with friends etc.

## 2017-12-23 NOTE — Progress Notes (Signed)
Patient ID: Amy Perez, female    DOB: Aug 22, 1966  MRN: [id number]  CC: Hypertension   Subjective: Amy Perez is a 52 y.o. female who presents for chronic ds management Her concerns today include:  Pt with hx of HTN, GERD, anxiety/depression, bone spurs in heels  1.  Chronic Sinusitis:  Will see ENT in f/u this mth.  Using Flonase as prescribed  2.  Has c-scope schedule 01/09/2018  3.  HTN:  Checks BP 3 a a wk.  Reports good readings.  Limits salt  4.  Depression/anxiety:  She stopped seeing therapist because she was giving her home work to write things down.  She was not finding it helpful.   -she cancel her last appointment with therapist and psychiatrist.  Taking Cymbalta which she finds helpful.  Tries to walk more.  Likes to read and meditation -lacks motivation to do anything.  Plans to try get back in church which she stopped doing when her mom pass in April 2018  5.. She d/c Gabapentin because flashes got better.  Did not have a menses for 4 mths but then had one when she started Gabapentin.  Lasted 7 days.   Patient Active Problem List   Diagnosis Date Noted  . Constipation 12/08/2017  . Taking multiple medications for chronic disease 12/08/2017  . Primary osteoarthritis of right knee 04/21/2017  . Acute bacterial sinusitis 11/07/2016  . Insomnia 07/02/2016  . Carpal tunnel syndrome 06/29/2016  . Gastroesophageal reflux disease without esophagitis 04/26/2016  . Essential hypertension 04/26/2016  . Generalized anxiety disorder 04/26/2016  . OBESITY, MORBID 03/01/2007     Current Outpatient Medications on File Prior to Visit  Medication Sig Dispense Refill  . atorvastatin (LIPITOR) 10 MG tablet Take 1 tablet (10 mg total) by mouth daily. 90 tablet 3  . cyclobenzaprine (FLEXERIL) 10 MG tablet Take 1 tablet (10 mg total) by mouth at bedtime. 15 tablet 0  . DULoxetine (CYMBALTA) 60 MG capsule Take 1 capsule (60 mg total) by mouth daily. 90 capsule 1  .  esomeprazole (NEXIUM) 40 MG capsule TAKE 1 CAPSULE BY MOUTH DAILY. 30 capsule 2  . fluticasone (FLONASE) 50 MCG/ACT nasal spray Place 2 sprays into both nostrils daily. 16 g 0  . gabapentin (NEURONTIN) 300 MG capsule Take 1 capsule (300 mg total) by mouth at bedtime. 30 capsule 3  . hydrochlorothiazide (HYDRODIURIL) 25 MG tablet TAKE 1 TABLET BY MOUTH DAILY. 90 tablet 0  . loratadine (CLARITIN) 10 MG tablet Take 1 tablet (10 mg total) by mouth daily. 30 tablet 2  . meloxicam (MOBIC) 7.5 MG tablet TAKE 1 TABLET BY MOUTH DAILY. 30 tablet 0  . Multiple Vitamin (MULTIVITAMIN) tablet Take 1 tablet by mouth daily. Reported on 04/28/2016    . pantoprazole (PROTONIX) 40 MG tablet TAKE 1 TABLET (40 MG TOTAL) BY MOUTH DAILY. 30 tablet 6  . polyethylene glycol powder (GLYCOLAX/MIRALAX) powder Take 17 g by mouth daily as needed. 3350 g 5   No current facility-administered medications on file prior to visit.     Allergies  Allergen Reactions  . Amoxicillin Nausea Only  . Benadryl [Diphenhydramine Hcl] Itching  . Milk-Related Compounds   . Oxycodone-Acetaminophen Nausea And Vomiting  . Penicillins Nausea Only    Social History   Socioeconomic History  . Marital status: Single    Spouse name: Not on file  . Number of children: Not on file  . Years of education: Not on file  . Highest education level:  Not on file  Social Needs  . Financial resource strain: Not on file  . Food insecurity - worry: Not on file  . Food insecurity - inability: Not on file  . Transportation needs - medical: Not on file  . Transportation needs - non-medical: Not on file  Occupational History  . Not on file  Tobacco Use  . Smoking status: Never Smoker  . Smokeless tobacco: Never Used  Substance and Sexual Activity  . Alcohol use: No  . Drug use: No  . Sexual activity: Not on file  Other Topics Concern  . Not on file  Social History Narrative  . Not on file    Family History  Problem Relation Age of Onset    . Hypertension Father [id number]       Deceased  . Heart attack Father   . Hypertension Mother        Living  . Arthritis Mother   . Diabetes Mother   . Dementia Mother   . Arthritis Sister        x2  . Lung cancer Sister        Deceased  . Cancer Other        Paternal & Maternal Aunts  . Heart attack Maternal Grandmother   . Neuropathy Sister   . Heart disease Brother        x1  . Alcoholism Brother        x2  . Colon cancer Neg Hx     Past Surgical History:  Procedure Laterality Date  . CHOLECYSTECTOMY    . TUBAL LIGATION  08/29/04  . WISDOM TOOTH EXTRACTION      ROS: Review of Systems As stated above PHYSICAL EXAM: BP 121/81   Pulse 89   Temp 98.7 F (37.1 C) (Oral)   Resp 16   Ht 5\' 6"  (1.676 m)   Wt 279 lb 3.2 oz (126.6 kg)   LMP 12/05/2017 (Exact Date)   SpO2 99%   BMI 45.06 kg/m   Physical Exam  General appearance - alert, well appearing, and in no distress Mental status - alert, oriented to person, place, and time, normal mood, behavior, speech, dress, motor activity, and thought processes Chest - clear to auscultation, no wheezes, rales or rhonchi, symmetric air entry Heart - normal rate, regular rhythm, normal S1, S2, no murmurs, rubs, clicks or gallops Extremities - peripheral pulses normal, no pedal edema, no clubbing or cyanosis    ASSESSMENT AND PLAN: 1. Essential hypertension At goal.  Continue HCTZ  2. Anxiety and depression Continue Cymbalta. Will resubmit referral to behavioral health requesting new therapist.  If not accepted patient advised to [facility]  3. Chronic sinusitis, unspecified location Followed by ENT.  Patient was given the opportunity to ask questions.  Patient verbalized understanding of the plan and was able to repeat key elements of the plan.   No orders of the defined types were placed in this encounter.    Requested Prescriptions    No prescriptions requested or ordered in this  encounter    No Follow-up on file.  Jonah Blueeborah Johnson, MD, FACP

## 2017-12-26 ENCOUNTER — Encounter (HOSPITAL_COMMUNITY): Payer: Self-pay | Admitting: Licensed Clinical Social Worker

## 2017-12-28 ENCOUNTER — Telehealth: Payer: Self-pay | Admitting: Internal Medicine

## 2017-12-28 MED ORDER — LINACLOTIDE 72 MCG PO CAPS: 72.0000 ug | ORAL_CAPSULE | Freq: Every day | ORAL | 3 refills | Status: DC

## 2017-12-28 MED FILL — !LINZESS 72 MCG CAPSULE: 72 MCG | 30 days supply | Qty: 30 | Fill #0

## 2017-12-28 NOTE — Telephone Encounter (Signed)
Routing message to [facility] refill 

## 2017-12-28 NOTE — Telephone Encounter (Signed)
Pt said the linzess samples were working and she needed a prescription [facility] [place]

## 2017-12-28 NOTE — Addendum Note (Signed)
Addended by: Gelene MinkBOONE, Masiyah Engen W on: 12/28/2017 03:33 PM   Modules accepted: Orders

## 2017-12-28 NOTE — Telephone Encounter (Signed)
Linzess 72 mcg sent to pharmacy.  

## 2017-12-29 MED FILL — ATORVASTATIN 10 MG TABLET: 10 | 30 days supply | Qty: 30 | Fill #3

## 2017-12-29 MED FILL — !CYMBALTA 60MG CAPSULE: 60 | 30 days supply | Qty: 30 | Fill #4

## 2017-12-29 MED FILL — HYDROCHLOROTHIAZIDE 25 MG T: 25 | 30 days supply | Qty: 30 | Fill #1

## 2017-12-29 MED FILL — PANTOPRAZOLE SOD DR 40 MG T: 40 | 30 days supply | Qty: 30 | Fill #2

## 2017-12-29 NOTE — Patient Instructions (Signed)
[facility]  [facility]     @PREFPERIOPPHARMACY @   Your procedure is scheduled on  01/09/2018   [facility] at  640   A.M.  Call this number if you have problems the morning of surgery:  [phone]   Remember:  Do not eat food or drink liquids after midnight.  Take these medicines the morning of surgery with A SIP OF WATER  Cymbalta, nexium, neurontin, mobic, protonix.   Do not wear jewelry, make-up or nail polish.  Do not wear lotions, powders, or perfumes, or deodorant.  Do not shave 48 hours prior to surgery.  Men may shave face and neck.  Do not bring valuables to the hospital.  [facility] is not responsible for any belongings or valuables.  Contacts, dentures or bridgework may not be worn into surgery.  Leave your suitcase in the car.  After surgery it may be brought to your room.  For patients admitted to the hospital, discharge time will be determined by your treatment team.  Patients discharged the day of surgery will not be allowed to drive home.   Name and phone number of your driver:   family Special instructions:  Follow the diet and prep instructions given to you by Dr Luvenia Starchourk's office.  Please read over the following fact sheets that you were given. Anesthesia Post-op Instructions and Care and Recovery After Surgery       Colonoscopy, Adult A colonoscopy is an exam to look at the large intestine. It is done to check for problems, such as:  Lumps (tumors).  Growths (polyps).  Swelling (inflammation).  Bleeding.  What happens before the procedure? Eating and drinking Follow instructions from your doctor about eating and drinking. These instructions may include:  A few days before the procedure - follow a low-fiber diet. ? Avoid nuts. ? Avoid seeds. ? Avoid dried fruit. ? Avoid raw fruits. ? Avoid vegetables.  1-3 days before the procedure - follow a clear liquid diet. Avoid liquids that have red or purple dye.  Drink only clear liquids, such as: ? Clear broth or bouillon. ? Black coffee or tea. ? Clear juice. ? Clear soft drinks or sports drinks. ? Gelatin dessert. ? Popsicles.  On the day of the procedure - do not eat or drink anything during the 2 hours before the procedure.  Bowel prep If you were prescribed an oral bowel prep:  Take it as told by your doctor. Starting the day before your procedure, you will need to drink a lot of liquid. The liquid will cause you to poop (have bowel movements) until your poop is almost clear or light green.  If your skin or butt gets irritated from diarrhea, you may: ? Wipe the area with wipes that have medicine in them, such as adult wet wipes with aloe and vitamin E. ? Put something on your skin that soothes the area, such as petroleum jelly.  If you throw up (vomit) while drinking the bowel prep, take a break for up to 60 minutes. Then begin the bowel prep again. If you keep throwing up and you cannot take the bowel prep without throwing up, call your doctor.  General instructions  Ask your doctor about changing or stopping your normal medicines. This is important if you take diabetes medicines or blood thinners.  Plan to have someone take you home from the hospital or clinic. What happens during the procedure?  An  IV tube may be put into one of your veins.  You will be given medicine to help you relax (sedative).  To reduce your risk of infection: ? Your doctors will wash their hands. ? Your anal area will be washed with soap.  You will be asked to lie on your side with your knees bent.  Your doctor will get a long, thin, flexible tube ready. The tube will have a camera and a light on the end.  The tube will be put into your anus.  The tube will be gently put into your large intestine.  Air will be delivered into your large intestine to keep it open. You may feel some pressure or cramping.  The camera will be used to take photos.  A  small tissue sample may be removed from your body to be looked at under a microscope (biopsy). If any possible problems are found, the tissue will be sent to a lab for testing.  If small growths are found, your doctor may remove them and have them checked for cancer.  The tube that was put into your anus will be slowly removed. The procedure may vary among doctors and hospitals. What happens after the procedure?  Your doctor will check on you often until the medicines you were given have worn off.  Do not drive for 24 hours after the procedure.  You may have a small amount of blood in your poop.  You may pass gas.  You may have mild cramps or bloating in your belly (abdomen).  It is up to you to get the results of your procedure. Ask your doctor, or the department performing the procedure, when your results will be ready. This information is not intended to replace advice given to you by your health care provider. Make sure you discuss any questions you have with your health care provider. Document Released: 11/13/2010 Document Revised: 08/11/2016 Document Reviewed: 12/23/2015 Elsevier Interactive Patient Education  2017 Elsevier Inc[employer]  Colonoscopy, Adult, Care After This sheet gives you information about how to care for yourself after your procedure. Your health care provider may also give you more specific instructions. If you have problems or questions, contact your health care provider. What can I expect after the procedure? After the procedure, it is common to have:  A small amount of blood in your stool for 24 hours after the procedure.  Some gas.  Mild abdominal cramping or bloating.  Follow these instructions at home: General instructions   For the first 24 hours after the procedure: ? Do not drive or use machinery. ? Do not sign important documents. ? Do not drink alcohol. ? Do your regular daily activities at a slower pace than normal. ? Eat soft, easy-to-digest  foods. ? Rest often.  Take over-the-counter or prescription medicines only as told by your health care provider.  It is up to you to get the results of your procedure. Ask your health care provider, or the department performing the procedure, when your results will be ready. Relieving cramping and bloating  Try walking around when you have cramps or feel bloated.  Apply heat to your abdomen as told by your health care provider. Use a heat source that your health care provider recommends, such as a moist heat pack or a heating pad. ? Place a towel between your skin and the heat source. ? Leave the heat on for 20-30 minutes. ? Remove the heat if your skin turns bright red. This is especially  important if you are unable to feel pain, heat, or cold. You may have a greater risk of getting burned. Eating and drinking  Drink enough fluid to keep your urine clear or pale yellow.  Resume your normal diet as instructed by your health care provider. Avoid heavy or fried foods that are hard to digest.  Avoid drinking alcohol for as long as instructed by your health care provider. Contact a health care provider if:  You have blood in your stool 2-3 days after the procedure. Get help right away if:  You have more than a small spotting of blood in your stool.  You pass large blood clots in your stool.  Your abdomen is swollen.  You have nausea or vomiting.  You have a fever.  You have increasing abdominal pain that is not relieved with medicine. This information is not intended to replace advice given to you by your health care provider. Make sure you discuss any questions you have with your health care provider. Document Released: 05/25/2004 Document Revised: 07/05/2016 Document Reviewed: 12/23/2015 Elsevier Interactive Patient Education  2018 [place] Anesthesia is a term that refers to techniques, procedures, and medicines that help a person stay safe  and comfortable during a medical procedure. Monitored anesthesia care, or sedation, is one type of anesthesia. Your anesthesia specialist may recommend sedation if you will be having a procedure that does not require you to be unconscious, such as:  Cataract surgery.  A dental procedure.  A biopsy.  A colonoscopy.  During the procedure, you may receive a medicine to help you relax (sedative). There are three levels of sedation:  Mild sedation. At this level, you may feel awake and relaxed. You will be able to follow directions.  Moderate sedation. At this level, you will be sleepy. You may not remember the procedure.  Deep sedation. At this level, you will be asleep. You will not remember the procedure.  The more medicine you are given, the deeper your level of sedation will be. Depending on how you respond to the procedure, the anesthesia specialist may change your level of sedation or the type of anesthesia to fit your needs. An anesthesia specialist will monitor you closely during the procedure. Let your health care provider know about:  Any allergies you have.  All medicines you are taking, including vitamins, herbs, eye drops, creams, and over-the-counter medicines.  Any use of steroids (by mouth or as a cream).  Any problems you or family members have had with sedatives and anesthetic medicines.  Any blood disorders you have.  Any surgeries you have had.  Any medical conditions you have, such as sleep apnea.  Whether you are pregnant or may be pregnant.  Any use of cigarettes, alcohol, or street drugs. What are the risks? Generally, this is a safe procedure. However, problems may occur, including:  Getting too much medicine (oversedation).  Nausea.  Allergic reaction to medicines.  Trouble breathing. If this happens, a breathing tube may be used to help with breathing. It will be removed when you are awake and breathing on your own.  Heart trouble.  Lung  trouble.  Before the procedure Staying hydrated Follow instructions from your health care provider about hydration, which may include:  Up to 2 hours before the procedure - you may continue to drink clear liquids, such as water, clear fruit juice, black coffee, and plain tea.  Eating and drinking restrictions Follow instructions from your health care provider about  eating and drinking, which may include:  8 hours before the procedure - stop eating heavy meals or foods such as meat, fried foods, or fatty foods.  6 hours before the procedure - stop eating light meals or foods, such as toast or cereal.  6 hours before the procedure - stop drinking milk or drinks that contain milk.  2 hours before the procedure - stop drinking clear liquids.  Medicines Ask your health care provider about:  Changing or stopping your regular medicines. This is especially important if you are taking diabetes medicines or blood thinners.  Taking medicines such as aspirin and ibuprofen. These medicines can thin your blood. Do not take these medicines before your procedure if your health care provider instructs you not to.  Tests and exams  You will have a physical exam.  You may have blood tests done to show: ? How well your kidneys and liver are working. ? How well your blood can clot.  General instructions  Plan to have someone take you home from the hospital or clinic.  If you will be going home right after the procedure, plan to have someone with you for 24 hours.  What happens during the procedure?  Your blood pressure, heart rate, breathing, level of pain and overall condition will be monitored.  An IV tube will be inserted into one of your veins.  Your anesthesia specialist will give you medicines as needed to keep you comfortable during the procedure. This may mean changing the level of sedation.  The procedure will be performed. After the procedure  Your blood pressure, heart rate,  breathing rate, and blood oxygen level will be monitored until the medicines you were given have worn off.  Do not drive for 24 hours if you received a sedative.  You may: ? Feel sleepy, clumsy, or nauseous. ? Feel forgetful about what happened after the procedure. ? Have a sore throat if you had a breathing tube during the procedure. ? Vomit. This information is not intended to replace advice given to you by your health care provider. Make sure you discuss any questions you have with your health care provider. Document Released: 07/07/2005 Document Revised: 03/19/2016 Document Reviewed: 02/01/2016 Elsevier Interactive Patient Education  2018 [place], Care After These instructions provide you with information about caring for yourself after your procedure. Your health care provider may also give you more specific instructions. Your treatment has been planned according to current medical practices, but problems sometimes occur. Call your health care provider if you have any problems or questions after your procedure. What can I expect after the procedure? After your procedure, it is common to:  Feel sleepy for several hours.  Feel clumsy and have poor balance for several hours.  Feel forgetful about what happened after the procedure.  Have poor judgment for several hours.  Feel nauseous or vomit.  Have a sore throat if you had a breathing tube during the procedure.  Follow these instructions at home: For at least 24 hours after the procedure:   Do not: ? Participate in activities in which you could fall or become injured. ? Drive. ? Use heavy machinery. ? Drink alcohol. ? Take sleeping pills or medicines that cause drowsiness. ? Make important decisions or sign legal documents. ? Take care of children on your own.  Rest. Eating and drinking  Follow the diet that is recommended by your health care provider.  If you vomit, drink water,  juice, or  soup when you can drink without vomiting.  Make sure you have little or no nausea before eating solid foods. General instructions  Have a responsible adult stay with you until you are awake and alert.  Take over-the-counter and prescription medicines only as told by your health care provider.  If you smoke, do not smoke without supervision.  Keep all follow-up visits as told by your health care provider. This is important. Contact a health care provider if:  You keep feeling nauseous or you keep vomiting.  You feel light-headed.  You develop a rash.  You have a fever. Get help right away if:  You have trouble breathing. This information is not intended to replace advice given to you by your health care provider. Make sure you discuss any questions you have with your health care provider. Document Released: 02/01/2016 Document Revised: 06/02/2016 Document Reviewed: 02/01/2016 Elsevier Interactive Patient Education  [employer].

## 2018-01-02 ENCOUNTER — Encounter (HOSPITAL_COMMUNITY)
Admission: RE | Admit: 2018-01-02 | Discharge: 2018-01-02 | Disposition: A | Discharge status: Home / Self Care | Payer: Self-pay | Source: Ambulatory Visit | Attending: Internal Medicine | Admitting: Internal Medicine

## 2018-01-02 ENCOUNTER — Other Ambulatory Visit: Payer: Self-pay

## 2018-01-02 ENCOUNTER — Encounter (HOSPITAL_COMMUNITY): Payer: Self-pay

## 2018-01-02 ENCOUNTER — Telehealth: Payer: Self-pay

## 2018-01-02 DIAGNOSIS — Z01810 Encounter for preprocedural cardiovascular examination: Secondary | ICD-10-CM | POA: Insufficient documentation

## 2018-01-02 DIAGNOSIS — Z1211 Encounter for screening for malignant neoplasm of colon: Secondary | ICD-10-CM | POA: Insufficient documentation

## 2018-01-02 DIAGNOSIS — I4581 Long QT syndrome: Secondary | ICD-10-CM | POA: Insufficient documentation

## 2018-01-02 DIAGNOSIS — Z8639 Personal history of other endocrine, nutritional and metabolic disease: Secondary | ICD-10-CM

## 2018-01-02 HISTORY — DX: Headache: R51

## 2018-01-02 HISTORY — DX: Major depressive disorder, single episode, unspecified: F32.9

## 2018-01-02 HISTORY — DX: Headache, unspecified: R51.9

## 2018-01-02 HISTORY — DX: Depression, unspecified: F32.A

## 2018-01-02 HISTORY — DX: Sickle-cell trait: D57.3

## 2018-01-02 LAB — BASIC METABOLIC PANEL
Anion gap: 12 (ref 5–15)
BUN: 6 mg/dL (ref 6–20)
CHLORIDE: 102 mmol/L (ref 101–111)
CO2: 27 mmol/L (ref 22–32)
CREATININE: 0.88 mg/dL (ref 0.44–1.00)
Calcium: 9.2 mg/dL (ref 8.9–10.3)
GFR calc non Af Amer: >60 mL/min (ref >60)
Glucose, Bld: 90 mg/dL (ref 65–99)
POTASSIUM: 2.4 mmol/L — AB (ref 3.5–5.1)
SODIUM: 141 mmol/L (ref 135–145)

## 2018-01-02 LAB — CBC WITH DIFFERENTIAL/PLATELET
BASOS PCT: 0 %
Basophils Absolute: 0 10*3/uL (ref 0.0–0.1)
Eosinophils Absolute: 0.3 10*3/uL (ref 0.0–0.7)
Eosinophils Relative: 6 %
HEMATOCRIT: 37.2 % (ref 36.0–46.0)
HEMOGLOBIN: 12.1 g/dL (ref 12.0–15.0)
LYMPHS ABS: 1.9 10*3/uL (ref 0.7–4.0)
Lymphocytes Relative: 35 %
MCH: 25.3 pg — AB (ref 26.0–34.0)
MCHC: 32.5 g/dL (ref 30.0–36.0)
MCV: 77.7 fL — ABNORMAL LOW (ref 78.0–100.0)
MONOS PCT: 6 %
Monocytes Absolute: 0.3 10*3/uL (ref 0.1–1.0)
NEUTROS ABS: 2.9 10*3/uL (ref 1.7–7.7)
NEUTROS PCT: 53 %
Platelets: 349 10*3/uL (ref 150–400)
RBC: 4.79 MIL/uL (ref 3.87–5.11)
RDW: 16.8 % — ABNORMAL HIGH (ref 11.5–15.5)
WBC: 5.5 10*3/uL (ref 4.0–10.5)

## 2018-01-02 LAB — HCG, SERUM, QUALITATIVE: PREG SERUM: NEGATIVE

## 2018-01-02 MED ORDER — POTASSIUM CHLORIDE ER 10 MEQ PO TBCR: 40.0000 meq | EXTENDED_RELEASE_TABLET | Freq: Three times a day (TID) | ORAL | 0 refills | Status: DC

## 2018-01-02 MED FILL — POTASSIUM CL 10 MEQ TAB SA: 10 | 3 days supply | Qty: 30 | Fill #0

## 2018-01-02 NOTE — Pre-Procedure Instructions (Signed)
Potassium of 2.4 [facility] to [facility]. Lewie LoronAnna Boone, PA to handle supplement.

## 2018-01-02 NOTE — Telephone Encounter (Signed)
Nurse at [facility] called office. Potassium 2.4. Colonoscopy scheduled for 01/09/18 with Dr. Jena Gaussourk.

## 2018-01-02 NOTE — Telephone Encounter (Signed)
Noted. [facility] and [facility].

## 2018-01-02 NOTE — Telephone Encounter (Signed)
I attempted to call patient at home but unable to reach initially. I then called her cell phone. She is only taking HCTZ 25 mg daily. No other diuretics. No diarrhea, vomiting. When asked about muscle cramping, fatigue, etc., she says this is "all the time for a long time". No acute symptoms noted. She does feel fatigued, but this is chronic. She is declining ED evaluation.   Will start potassium oral supplementation, and ED precautions provided. Needs 60 meq potassium this evening, then next 2 days 40 meq TID. Needs to recheck BMP and Magnesium STAT 3/12, as may need longer therapy. [facility]. Can we make sure orders are at the lab [facility]?   ED precautions again reviewed.

## 2018-01-03 ENCOUNTER — Telehealth: Payer: Self-pay | Admitting: Internal Medicine

## 2018-01-03 ENCOUNTER — Other Ambulatory Visit (HOSPITAL_COMMUNITY)
Admission: RE | Admit: 2018-01-03 | Discharge: 2018-01-03 | Disposition: A | Discharge status: Home / Self Care | Payer: No Typology Code available for payment source | Source: Ambulatory Visit | Attending: Gastroenterology | Admitting: Gastroenterology

## 2018-01-03 DIAGNOSIS — Z8639 Personal history of other endocrine, nutritional and metabolic disease: Secondary | ICD-10-CM | POA: Insufficient documentation

## 2018-01-03 LAB — BASIC METABOLIC PANEL
Anion gap: 9 (ref 5–15)
BUN: 7 mg/dL (ref 6–20)
CHLORIDE: 102 mmol/L (ref 101–111)
CO2: 31 mmol/L (ref 22–32)
Calcium: 9.7 mg/dL (ref 8.9–10.3)
Creatinine, Ser: 0.9 mg/dL (ref 0.44–1.00)
GFR calc non Af Amer: >60 mL/min (ref >60)
Glucose, Bld: 89 mg/dL (ref 65–99)
POTASSIUM: 3.3 mmol/L — AB (ref 3.5–5.1)
SODIUM: 142 mmol/L (ref 135–145)

## 2018-01-03 LAB — MAGNESIUM: MAGNESIUM: 1.6 mg/dL — AB (ref 1.7–2.4)

## 2018-01-03 NOTE — Progress Notes (Signed)
Potassium improved from yesterday, now 3.3. Needs to continue potassium supplementation until prescription complete. I am also recommending Magnesium 400 mg oral BID starting now. Need to recheck BMP on Friday. If she is having diarrhea with Linzess, then we need to consider another alternative. Any alternative regimens for constipation please address with Minerva AreolaEric, who initially saw patient. I have only been handling this as the pre-op labs were sent to me.

## 2018-01-03 NOTE — Telephone Encounter (Signed)
Pt called asking to speak with nurse. She wanted to know where in [place] to have her lab work done. Please call her at [phone]

## 2018-01-03 NOTE — Telephone Encounter (Signed)
Spoke with pt and she will have labs drawn at [facility] lab as directed.

## 2018-01-04 ENCOUNTER — Other Ambulatory Visit: Payer: Self-pay

## 2018-01-04 DIAGNOSIS — Z8639 Personal history of other endocrine, nutritional and metabolic disease: Secondary | ICD-10-CM

## 2018-01-06 ENCOUNTER — Telehealth: Payer: Self-pay | Admitting: Internal Medicine

## 2018-01-06 ENCOUNTER — Other Ambulatory Visit: Payer: Self-pay | Admitting: Gastroenterology

## 2018-01-06 ENCOUNTER — Other Ambulatory Visit (HOSPITAL_COMMUNITY)
Admission: RE | Admit: 2018-01-06 | Discharge: 2018-01-06 | Disposition: A | Discharge status: Home / Self Care | Payer: No Typology Code available for payment source | Source: Ambulatory Visit | Attending: Internal Medicine | Admitting: Internal Medicine

## 2018-01-06 ENCOUNTER — Telehealth: Payer: Self-pay

## 2018-01-06 DIAGNOSIS — Z8639 Personal history of other endocrine, nutritional and metabolic disease: Secondary | ICD-10-CM | POA: Insufficient documentation

## 2018-01-06 DIAGNOSIS — E876 Hypokalemia: Secondary | ICD-10-CM

## 2018-01-06 LAB — BASIC METABOLIC PANEL
ANION GAP: 10 (ref 5–15)
BUN: 12 mg/dL (ref 6–20)
CHLORIDE: 102 mmol/L (ref 101–111)
CO2: 28 mmol/L (ref 22–32)
Calcium: 9.4 mg/dL (ref 8.9–10.3)
Creatinine, Ser: 0.99 mg/dL (ref 0.44–1.00)
GFR calc Af Amer: >60 mL/min (ref >60)
Glucose, Bld: 97 mg/dL (ref 65–99)
POTASSIUM: 3.3 mmol/L — AB (ref 3.5–5.1)
Sodium: 140 mmol/L (ref 135–145)

## 2018-01-06 MED ORDER — POTASSIUM CHLORIDE ER 20 MEQ PO TBCR: 40.0000 meq | EXTENDED_RELEASE_TABLET | Freq: Two times a day (BID) | ORAL | 0 refills | Status: DC

## 2018-01-06 MED ORDER — POTASSIUM CHLORIDE ER 10 MEQ PO TBCR: 40.0000 meq | EXTENDED_RELEASE_TABLET | Freq: Two times a day (BID) | ORAL | 0 refills | Status: DC

## 2018-01-06 NOTE — Telephone Encounter (Signed)
Received a VM from pt asking if her lab order was sent to [facility]? Let a VM letting pt know that Lab orders were sent in as directed a few days ago.   Pt would also like a call back from AB. [phone]

## 2018-01-06 NOTE — Telephone Encounter (Signed)
Spoke with pt. She wanted to speak with Tobi BastosAnna (not a nurse). I advised her she was seeing patients and was unsure when Tobi Bastosnna would be able to call her. She began to get loud/rude on the phone. Stating she can't drink this gallon of prep. She states she was told at the OV we had samples and we didn't give her one. I advised patient we do get samples but we currently did not have any at the moment. She stated she can't drink he jug. I advised her again the self pay cost for other preps were really expensive. She began stating she was not going to have the TCS done since she can't get her doctor when she needs her as she has all these questions and stated she is suppose to have lab work done and did not know what she is suppose to do. I advised her to let me look in her chart to see and she began yelling and I advised patient no need to be rude, I was trying to help her and she stated I was being rude to her and hung up the call.

## 2018-01-06 NOTE — Telephone Encounter (Signed)
Labs have been sent previously. See other phone notes with documentation.

## 2018-01-06 NOTE — Telephone Encounter (Signed)
I will try to call her. I have never seen her before in the office and only helped with pre-op labs as I was the only one available that day. I will call her today but in the interim:  Amy Perez: please have her do labs [facility]. We should be able to just put it in the system or fax and have her go there today.

## 2018-01-06 NOTE — Telephone Encounter (Signed)
PC placed to pt this evening after I received results of BMP that was ordered by GI NP.  K was 3.3.  Pt was placed on Linzess 12/28/2017.  It caused a lot of diarrhea for her. Med d/c.  K level check 01/02/2018 was 2.4.  Pt placed on K+ supplement of 40 meq TID.  Level 01/03/2018 was 3.3.  Today, level was 3.3 again. GI NP sent rxn [facility] Pot[facility] but pt was not notified of this.  I told pt that I am sending rxn for Kdur 20 meq to [facility]. Pt to take 40 meq BID x 4 days.  She has colonoscopy on Monday.  She will return to lab next Wednesday for K+ recheck.  If still low, we will have her d/c HCTZ.

## 2018-01-06 NOTE — Telephone Encounter (Signed)
Patient will complete blood work today. May need additional potassium. Discussed I did not have samples to provide for colonoscopy prep and out of pocket would be pricey. No further concerns.

## 2018-01-06 NOTE — Telephone Encounter (Signed)
Pt called asking to speak with nurse about her colonoscopy that's scheduled with [facility] on Monday. Please call her at [phone]

## 2018-01-06 NOTE — Progress Notes (Signed)
Potassium 3.3. Additional supplementation sent to pharmacy. Left message on patient's home phone.

## 2018-01-09 ENCOUNTER — Ambulatory Visit (HOSPITAL_COMMUNITY): Payer: Self-pay | Admitting: Anesthesiology

## 2018-01-09 ENCOUNTER — Ambulatory Visit (HOSPITAL_COMMUNITY)
Admission: RE | Admit: 2018-01-09 | Discharge: 2018-01-09 | Disposition: A | Discharge status: Home / Self Care | Payer: Self-pay | Source: Ambulatory Visit | Attending: Internal Medicine | Admitting: Internal Medicine

## 2018-01-09 ENCOUNTER — Encounter (HOSPITAL_COMMUNITY): Payer: Self-pay | Admitting: Emergency Medicine

## 2018-01-09 ENCOUNTER — Encounter (HOSPITAL_COMMUNITY)
Admission: RE | Disposition: A | Discharge status: Home / Self Care | Payer: Self-pay | Source: Ambulatory Visit | Attending: Internal Medicine

## 2018-01-09 DIAGNOSIS — Z1211 Encounter for screening for malignant neoplasm of colon: Secondary | ICD-10-CM | POA: Insufficient documentation

## 2018-01-09 DIAGNOSIS — F419 Anxiety disorder, unspecified: Secondary | ICD-10-CM | POA: Insufficient documentation

## 2018-01-09 DIAGNOSIS — I10 Essential (primary) hypertension: Secondary | ICD-10-CM | POA: Insufficient documentation

## 2018-01-09 DIAGNOSIS — F329 Major depressive disorder, single episode, unspecified: Secondary | ICD-10-CM | POA: Insufficient documentation

## 2018-01-09 DIAGNOSIS — Z791 Long term (current) use of non-steroidal anti-inflammatories (NSAID): Secondary | ICD-10-CM | POA: Insufficient documentation

## 2018-01-09 DIAGNOSIS — Z79899 Other long term (current) drug therapy: Secondary | ICD-10-CM | POA: Insufficient documentation

## 2018-01-09 DIAGNOSIS — K219 Gastro-esophageal reflux disease without esophagitis: Secondary | ICD-10-CM | POA: Insufficient documentation

## 2018-01-09 DIAGNOSIS — Z7951 Long term (current) use of inhaled steroids: Secondary | ICD-10-CM | POA: Insufficient documentation

## 2018-01-09 DIAGNOSIS — Z1212 Encounter for screening for malignant neoplasm of rectum: Secondary | ICD-10-CM

## 2018-01-09 HISTORY — PX: COLONOSCOPY WITH PROPOFOL: SHX5780

## 2018-01-09 SURGERY — COLONOSCOPY WITH PROPOFOL
Anesthesia: Monitor Anesthesia Care

## 2018-01-09 MED ORDER — PROPOFOL 500 MG/50ML IV EMUL
INTRAVENOUS | Status: DC | PRN
  Administered 2018-01-09: 150 ug/kg/min via INTRAVENOUS

## 2018-01-09 MED ORDER — PROPOFOL 10 MG/ML IV BOLUS
INTRAVENOUS | Status: AC
  Filled 2018-01-09: qty 20

## 2018-01-09 MED ORDER — CHLORHEXIDINE GLUCONATE CLOTH 2 % EX PADS: 6.0000 | MEDICATED_PAD | Freq: Once | CUTANEOUS | Status: DC

## 2018-01-09 MED ORDER — LACTATED RINGERS IV SOLN
INTRAVENOUS | Status: DC
  Administered 2018-01-09: 08:00:00 via INTRAVENOUS

## 2018-01-09 MED ORDER — PROPOFOL 10 MG/ML IV BOLUS
INTRAVENOUS | Status: AC
  Filled 2018-01-09: qty 40

## 2018-01-09 MED FILL — POTASSIUM CL 10 MEQ TAB SA: 10 | 3 days supply | Qty: 24 | Fill #0

## 2018-01-09 NOTE — Anesthesia Postprocedure Evaluation (Signed)
Anesthesia Post Note  Patient: Amy Perez  Procedure(s) Performed: COLONOSCOPY WITH PROPOFOL (N/A )  Patient location during evaluation: PACU Anesthesia Type: MAC Level of consciousness: awake and alert and oriented Pain management: pain level controlled Vital Signs Assessment: post-procedure vital signs reviewed and stable Respiratory status: spontaneous breathing, nonlabored ventilation and respiratory function stable Cardiovascular status: stable Postop Assessment: no apparent nausea or vomiting Anesthetic complications: no     Last Vitals:  Vitals:   01/09/18 0900 01/09/18 0912  BP: (!) 147/106 (!) 150/82  Pulse: 88 91  Resp: (!) 21   Temp:  36.5 C  SpO2: 100% 99%    Last Pain:  Vitals:   01/09/18 0912  TempSrc: Oral                 Ricardo Kayes

## 2018-01-09 NOTE — Transfer of Care (Signed)
Immediate Anesthesia Transfer of Care Note  Patient: Amy Perez  Procedure(s) Performed: COLONOSCOPY WITH PROPOFOL (N/A )  Patient Location: PACU  Anesthesia Type:MAC  Level of Consciousness: awake, alert  and oriented  Airway & Oxygen Therapy: Patient Spontanous Breathing and Patient connected to nasal cannula oxygen  Post-op Assessment: Report given to RN and Post -op Vital signs reviewed and stable  Post vital signs: Reviewed and stable  Last Vitals:  Vitals:   01/09/18 0805 01/09/18 0810  BP: 139/88 (!) 141/86  Pulse:    Resp: (!) 28 20  Temp:    SpO2: 100% 100%    Last Pain:  Vitals:   01/09/18 0718  TempSrc: Oral         Complications: No apparent anesthesia complications

## 2018-01-09 NOTE — Anesthesia Preprocedure Evaluation (Signed)
Anesthesia Evaluation  Patient identified by MRN, date of birth, ID band Patient awake  General Assessment Comment:Very sleepy - drowsy in pre-op.  Reviewed: Allergy & Precautions, NPO status , Patient's Chart, lab work & pertinent test results  Airway Mallampati: III  TM Distance: >3 FB Neck ROM: Full    Dental  (+) Edentulous Upper, Dental Advisory Given   Pulmonary neg pulmonary ROS,    breath sounds clear to auscultation       Cardiovascular hypertension, Pt. on medications  Rhythm:Regular Rate:Normal     Neuro/Psych  Headaches, PSYCHIATRIC DISORDERS Anxiety Depression  Neuromuscular disease    GI/Hepatic PUD, GERD  ,  Endo/Other  Morbid obesity  Renal/GU      Musculoskeletal   Abdominal   Peds  Hematology  (+) Blood dyscrasia, Sickle cell trait ,   Anesthesia Other Findings   Reproductive/Obstetrics                             Anesthesia Physical Anesthesia Plan  ASA: III  Anesthesia Plan: MAC   Post-op Pain Management:    Induction: Intravenous  PONV Risk Score and Plan:   Airway Management Planned: Simple Face Mask  Additional Equipment:   Intra-op Plan:   Post-operative Plan:   Informed Consent: I have reviewed the patients History and Physical, chart, labs and discussed the procedure including the risks, benefits and alternatives for the proposed anesthesia with the patient or authorized representative who has indicated his/her understanding and acceptance.     Plan Discussed with:   Anesthesia Plan Comments:         Anesthesia Quick Evaluation

## 2018-01-09 NOTE — H&P (Signed)
@LOGO @   Primary Care Physician:  Marcine Matar, MD Primary Gastroenterologist:  Dr. Jena Gauss  Pre-Procedure History & Physical: HPI:  Amy Perez is a 52 y.o. female here for  first ever screening colonoscopy. Aside from chronic constipation,  No lower GI tract symptoms.No family history of colon cancer.  Past Medical History:  Diagnosis Date  . AC (acromioclavicular) joint bone spurs   . Acid reflux   . Anxiety   . Blood dyscrasia   . CTS (carpal tunnel syndrome)    Bilateral  . Depression   . Gastric ulcer   . GERD (gastroesophageal reflux disease)   . Headache   . Heel spur   . History of chicken pox   . Hypertension   . Panic attacks   . Plantar fasciitis   . Sickle cell trait (HCC)   . Tendonitis    Feet    Past Surgical History:  Procedure Laterality Date  . CHOLECYSTECTOMY    . TUBAL LIGATION  08/29/04  . WISDOM TOOTH EXTRACTION      Prior to Admission medications   Medication Sig Start Date End Date Taking? Authorizing Provider  atorvastatin (LIPITOR) 10 MG tablet Take 1 tablet (10 mg total) by mouth daily. 09/25/17  Yes Marcine Matar, MD  DULoxetine (CYMBALTA) 60 MG capsule Take 1 capsule (60 mg total) by mouth daily. 08/26/17  Yes Eksir, Bo Mcclintock, MD  esomeprazole (NEXIUM) 40 MG capsule TAKE 1 CAPSULE BY MOUTH DAILY. 10/28/17  Yes Marcine Matar, MD  fluticasone (FLONASE) 50 MCG/ACT nasal spray Place 2 sprays into both nostrils daily. 11/17/16  Yes Waldon Merl, PA-C  hydrochlorothiazide (HYDRODIURIL) 25 MG tablet TAKE 1 TABLET BY MOUTH DAILY. 10/28/17  Yes Marcine Matar, MD  linaclotide [facility]) 72 MCG capsule Take 1 capsule (72 mcg total) by mouth daily before breakfast. 12/28/17  Yes Gelene Mink, NP  loratadine (CLARITIN) 10 MG tablet Take 1 tablet (10 mg total) by mouth daily. 11/08/17  Yes Marcine Matar, MD  meloxicam (MOBIC) 7.5 MG tablet TAKE 1 TABLET BY MOUTH DAILY. 11/30/17  Yes Marcine Matar, MD  Multiple Vitamin  (MULTIVITAMIN) tablet Take 1 tablet by mouth daily. Reported on 04/28/2016   Yes [provider]  pantoprazole (PROTONIX) 40 MG tablet TAKE 1 TABLET (40 MG TOTAL) BY MOUTH DAILY. 10/29/17  Yes Marcine Matar, MD  polyethylene glycol powder (GLYCOLAX/MIRALAX) powder Take 17 g by mouth daily as needed. Patient taking differently: Take 17 g by mouth daily as needed (constipation).  09/23/17  Yes Marcine Matar, MD  potassium chloride 20 MEQ TBCR Take 40 mEq by mouth 2 (two) times daily. 01/06/18  Yes Marcine Matar, MD  gabapentin (NEURONTIN) 300 MG capsule Take 1 capsule (300 mg total) by mouth at bedtime. Patient not taking: Reported on 12/29/2017 11/08/17   Marcine Matar, MD    Allergies as of 12/08/2017 - Review Complete 12/08/2017  Allergen Reaction Noted  . Amoxicillin Nausea Only 12/15/2013  . Benadryl [diphenhydramine hcl] Itching 11/06/2012  . Milk-related compounds  09/22/2016  . Oxycodone-acetaminophen Nausea And Vomiting 02/27/2007  . Penicillins Nausea Only 02/27/2007    Family History  Problem Relation Age of Onset  . Hypertension Father 40       Deceased  . Heart attack Father   . Hypertension Mother        Living  . Arthritis Mother   . Diabetes Mother   . Dementia Mother   . Arthritis  Sister        x2  . Lung cancer Sister        Deceased  . Cancer Other        Paternal & Maternal Aunts  . Heart attack Maternal Grandmother   . Neuropathy Sister   . Heart disease Brother        x1  . Alcoholism Brother        x2  . Colon cancer Neg Hx     Social History   Socioeconomic History  . Marital status: Single    Spouse name: Not on file  . Number of children: Not on file  . Years of education: Not on file  . Highest education level: Not on file  Social Needs  . Financial resource strain: Not on file  . Food insecurity - worry: Not on file  . Food insecurity - inability: Not on file  . Transportation needs - medical: Not on file  .  Transportation needs - non-medical: Not on file  Occupational History  . Not on file  Tobacco Use  . Smoking status: Never Smoker  . Smokeless tobacco: Never Used  Substance and Sexual Activity  . Alcohol use: No  . Drug use: No  . Sexual activity: Not on file  Other Topics Concern  . Not on file  Social History Narrative  . Not on file    Review of Systems: See HPI, otherwise negative ROS  Physical Exam: BP (!) 128/91   Pulse 86   Temp 98.7 F (37.1 C) (Oral)   Resp 18   LMP 12/14/2017   SpO2 98%  General:   Alert,  Well-developed, well-nourished, pleasant and cooperative in NAD Neck:  Supple; no masses or thyromegaly. No significant cervical adenopathy. Lungs:  Clear throughout to auscultation.   No wheezes, crackles, or rhonchi. No acute distress. Heart:  Regular rate and rhythm; no murmurs, clicks, rubs,  or gallops. Abdomen: Non-distended, normal bowel sounds.  Soft and nontender without appreciable mass or hepatosplenomegaly.  Pulses:  Normal pulses noted. Extremities:  Without clubbing or edema.  Impression:   52 year old lady here for her first ever screening colonoscopy. Average risk screening examination.  Recommendations:  I have offered the patient a screening colonoscopy today.  The risks, benefits, limitations, alternatives and imponderables have been reviewed with the patient. Questions have been answered. All parties are agreeable.      Notice: This dictation was prepared with Dragon dictation along with smaller phrase technology. Any transcriptional errors that result from this process are unintentional and may not be corrected upon review.

## 2018-01-09 NOTE — Op Note (Signed)
[facility] Patient Name: Amy Perez Procedure Date: 01/09/2018 8:09 AM MRN: [id number] Date of Birth: 06-03-1966 Attending MD: Gennette Pac , MD CSN: [id number] Age: 52 Admit Type: Outpatient Procedure:                Colonoscopy Indications:              Screening for colorectal malignant neoplasm Providers:                Gennette Pac, MD, Jannett Celestine, RN, Dyann Ruddle Referring MD:              Medicines:                Propofol per Anesthesia Complications:            No immediate complications. Estimated Blood Loss:     Estimated blood loss: none. Procedure:                Pre-Anesthesia Assessment:                           - Prior to the procedure, a History and Physical                            was performed, and patient medications and                            allergies were reviewed. The patient's tolerance of                            previous anesthesia was also reviewed. The risks                            and benefits of the procedure and the sedation                            options and risks were discussed with the patient.                            All questions were answered, and informed consent                            was obtained. Prior Anticoagulants: The patient has                            taken no previous anticoagulant or antiplatelet                            agents. ASA Grade Assessment: II - A patient with                            mild systemic disease. After reviewing the risks  and benefits, the patient was deemed in                            satisfactory condition to undergo the procedure.                           After obtaining informed consent, the colonoscope                            was passed under direct vision. Throughout the                            procedure, the patient's blood pressure, pulse, and                            oxygen  saturations were monitored continuously. The                            EC-[id number] ([id number] scope was introduced through                            the and advanced to the the cecum, identified by                            appendiceal orifice and ileocecal valve. The                            colonoscopy was performed without difficulty. The                            patient tolerated the procedure well. The quality                            of the bowel preparation was adequate. The                            ileocecal valve, appendiceal orifice, and rectum                            were photographed. The entire colon was well                            visualized. Scope In: 8:31:21 AM Scope Out: 8:44:34 AM Scope Withdrawal Time: 0 hours 6 minutes 42 seconds  Total Procedure Duration: 0 hours 13 minutes 13 seconds  Findings:      The perianal and digital rectal examinations were normal.      The colon (entire examined portion) appeared normal.      The entire examined colon appeared normal on direct and retroflexion       views. Impression:               - The entire examined colon is normal.                           - The entire examined  colon is normal on direct and                            retroflexion views.                           - No specimens collected. Moderate Sedation:      Moderate (conscious) sedation was personally administered by an       anesthesia professional. The following parameters were monitored: oxygen       saturation, heart rate, blood pressure, respiratory rate, EKG, adequacy       of pulmonary ventilation, and response to care. Total physician       intraservice time was 18 minutes. Recommendation:           - Patient has a contact number available for                            emergencies. The signs and symptoms of potential                            delayed complications were discussed with the                            patient. Return to  normal activities tomorrow.                            Written discharge instructions were provided to the                            patient.                           - Resume previous diet.                           - Continue present medications.                           - Repeat colonoscopy in 10 years for screening                            purposes.                           - Return to GI office in 3 months. Procedure Code(s):        --- Professional ---                           [phone], Colonoscopy, flexible; diagnostic, including                            collection of specimen(s) by brushing or washing,                            when performed (separate procedure) Diagnosis Code(s):        --- Professional ---  Z12.11, Encounter for screening for malignant                            neoplasm of colon CPT copyright 2016 American Medical Association. All rights reserved. The codes documented in this report are preliminary and upon coder review may  be revised to meet current compliance requirements. Gerrit Friends. Noya Santarelli, MD Gennette Pac, MD 01/09/2018 8:50:48 AM This report has been signed electronically. Number of Addenda: 0

## 2018-01-09 NOTE — Discharge Instructions (Signed)
Colonoscopy Discharge Instructions  Read the instructions outlined below and refer to this sheet in the next few weeks. These discharge instructions provide you with general information on caring for yourself after you leave the hospital. Your doctor may also give you specific instructions. While your treatment has been planned according to the most current medical practices available, unavoidable complications occasionally occur. If you have any problems or questions after discharge, call Dr. Jena Gaussourk at [phone]. ACTIVITY  You may resume your regular activity, but move at a slower pace for the next 24 hours.   Take frequent rest periods for the next 24 hours.   Walking will help get rid of the air and reduce the bloated feeling in your belly (abdomen).   No driving for 24 hours (because of the medicine (anesthesia) used during the test).    Do not sign any important legal documents or operate any machinery for 24 hours (because of the anesthesia used during the test).  NUTRITION  Drink plenty of fluids.   You may resume your normal diet as instructed by your doctor.   Begin with a light meal and progress to your normal diet. Heavy or fried foods are harder to digest and may make you feel sick to your stomach (nauseated).   Avoid alcoholic beverages for 24 hours or as instructed.  MEDICATIONS  You may resume your normal medications unless your doctor tells you otherwise.  WHAT YOU CAN EXPECT TODAY  Some feelings of bloating in the abdomen.   Passage of more gas than usual.   Spotting of blood in your stool or on the toilet paper.  IF YOU HAD POLYPS REMOVED DURING THE COLONOSCOPY:  No aspirin products for 7 days or as instructed.   No alcohol for 7 days or as instructed.   Eat a soft diet for the next 24 hours.  FINDING OUT THE RESULTS OF YOUR TEST Not all test results are available during your visit. If your test results are not back during the visit, make an appointment  with your caregiver to find out the results. Do not assume everything is normal if you have not heard from your caregiver or the medical facility. It is important for you to follow up on all of your test results.  SEEK IMMEDIATE MEDICAL ATTENTION IF:  You have more than a spotting of blood in your stool.   Your belly is swollen (abdominal distention).   You are nauseated or vomiting.   You have a temperature over 101.   You have abdominal pain or discomfort that is severe or gets worse throughout the day.    Repeat colonoscopy in 10 years for screening purposes  Office visit with us in 3 months     PATIENT INSTRUCTIONS POST-ANESTHESIA  IMMEDIATELY FOLLOWING SURGERY:  Do not drive or operate machinery for the first twenty four hours after surgery.  Do not make any important decisions for twenty four hours after surgery or while taking narcotic pain medications or sedatives.  If you develop intractable nausea and vomiting or a severe headache please notify your doctor immediately.  FOLLOW-UP:  Please make an appointment with your surgeon as instructed. You do not need to follow up with anesthesia unless specifically instructed to do so.  WOUND CARE INSTRUCTIONS (if applicable):  Keep a dry clean dressing on the anesthesia/puncture wound site if there is drainage.  Once the wound has quit draining you may leave it open to air.  Generally you should leave the bandage intact  for twenty four hours unless there is drainage.  If the epidural site drains for more than 36-48 hours please call the anesthesia department.  QUESTIONS?:  Please feel free to call your physician or the hospital operator if you have any questions, and they will be happy to assist you.

## 2018-01-10 NOTE — Addendum Note (Signed)
Addendum  created 01/10/18 0809 by Franco NonesYates, Daesia Zylka S, CRNA   Charge Capture section accepted

## 2018-01-12 ENCOUNTER — Ambulatory Visit: Payer: No Typology Code available for payment source | Attending: Internal Medicine

## 2018-01-12 DIAGNOSIS — E876 Hypokalemia: Secondary | ICD-10-CM | POA: Insufficient documentation

## 2018-01-12 NOTE — Progress Notes (Signed)
Patient here for lab visit only 

## 2018-01-13 ENCOUNTER — Encounter (HOSPITAL_COMMUNITY): Payer: Self-pay | Admitting: Internal Medicine

## 2018-01-13 ENCOUNTER — Other Ambulatory Visit: Payer: Self-pay | Admitting: Internal Medicine

## 2018-01-13 ENCOUNTER — Telehealth: Payer: Self-pay

## 2018-01-13 DIAGNOSIS — E876 Hypokalemia: Secondary | ICD-10-CM

## 2018-01-13 LAB — POTASSIUM: POTASSIUM: 3.5 mmol/L (ref 3.5–5.2)

## 2018-01-13 NOTE — Telephone Encounter (Signed)
Contacted pt to go over lab results pt is aware of results and is aware to come back to the lab in one week. Pt doesn't have any questions or concerns

## 2018-01-17 ENCOUNTER — Telehealth: Payer: Self-pay | Admitting: *Deleted

## 2018-01-17 NOTE — Telephone Encounter (Signed)
Patient called in stating Amy Perez advised her to stop the linzess. She wants to know if something else was going to be called in for her. Please advise thanks

## 2018-01-18 NOTE — Telephone Encounter (Signed)
I have never personally seen patient. I was brought into her care when I was called about pre-op labs. I don't recall telling her to stop Linzess.   We need some more information regarding constipation. Please address anything further with Amy Perez who saw patient in office regarding constipation regimen.

## 2018-01-18 NOTE — Telephone Encounter (Signed)
Spoke with pt. She states, she has a history of constipation. Pt has a bowel movement 2 times weekly and has to strain to get anything out. She feels she needs something to her have a bowel movement. No otc products are being used. Pt has tried Miralax. previously before coming to our office and it didn't work.

## 2018-01-19 ENCOUNTER — Ambulatory Visit (INDEPENDENT_AMBULATORY_CARE_PROVIDER_SITE_OTHER): Payer: Self-pay | Admitting: Otolaryngology

## 2018-01-19 ENCOUNTER — Other Ambulatory Visit: Payer: Self-pay | Admitting: Internal Medicine

## 2018-01-19 DIAGNOSIS — M1711 Unilateral primary osteoarthritis, right knee: Secondary | ICD-10-CM

## 2018-01-19 MED FILL — MELOXICAM 7.5 MG TABLET: 7.5 | 30 days supply | Qty: 30 | Fill #0

## 2018-01-20 ENCOUNTER — Other Ambulatory Visit: Payer: Self-pay

## 2018-01-20 NOTE — Telephone Encounter (Signed)
The last I saw in early march she stated Linzess was working and we sent a Rx in. Is she still taking it? When did it stop working?

## 2018-01-21 LAB — POTASSIUM: POTASSIUM: 3.3 mmol/L — AB (ref 3.5–5.2)

## 2018-01-22 ENCOUNTER — Other Ambulatory Visit: Payer: Self-pay | Admitting: Internal Medicine

## 2018-01-22 MED ORDER — POTASSIUM CHLORIDE ER 20 MEQ PO TBCR: 20.0000 meq | EXTENDED_RELEASE_TABLET | Freq: Every day | ORAL | 6 refills | Status: DC

## 2018-01-23 ENCOUNTER — Telehealth: Payer: Self-pay

## 2018-01-23 NOTE — Telephone Encounter (Signed)
Lmom, waiting on a return call.  

## 2018-01-23 NOTE — Telephone Encounter (Signed)
Contacted pt to go over lab results pt didn't answer left a detailed vm informing pt of results and if she has any questions or concerns to give me a call  If pt calls back please give results: repeat Potassium low at 3.3 with Nl being 3.5-5. Would like for her to take Potassium 20 meq daily. [facility]

## 2018-01-24 NOTE — Telephone Encounter (Signed)
Pt returned call. Pt said she was told to d/c Linzess due to her potassium being low. Pt said the Linzess was working when she took it, but it was working too much. She said it caused diarrhea. Pt is still not able to have regular bowl movements without assistance through medication.

## 2018-01-24 NOTE — Telephone Encounter (Signed)
Ok, lets try Amitiza 24 mcg twice daily ON A FULL STOMACH. The patient can pick up samples from our office. Call with progress report in 5-7 days.

## 2018-01-24 NOTE — Telephone Encounter (Signed)
Spoke with pt and she will pick samples up this week. Amitiza are the only samples our office has at this time, ok per EG to take Amitiza 1 tab bid with food and if not improving, pt can try Amitiza . Pt will call back with an update.

## 2018-01-30 MED FILL — ?ATORVASTATIN 10 MG TABLET: 10 | 30 days supply | Qty: 30 | Fill #4

## 2018-01-30 MED FILL — HYDROCHLOROTHIAZIDE 25 MG T: 25 | 30 days supply | Qty: 30 | Fill #2

## 2018-01-30 MED FILL — ?DULoxetine HCL 6OMG CP: 60 | 30 days supply | Qty: 30 | Fill #5

## 2018-01-30 MED FILL — ?PANTOPRAZOLE SO DR 40MG TA: 40 | 30 days supply | Qty: 30 | Fill #3

## 2018-02-08 ENCOUNTER — Encounter (HOSPITAL_COMMUNITY): Payer: Self-pay | Admitting: Psychiatry

## 2018-02-08 ENCOUNTER — Ambulatory Visit (INDEPENDENT_AMBULATORY_CARE_PROVIDER_SITE_OTHER): Payer: No Typology Code available for payment source | Admitting: Psychiatry

## 2018-02-08 ENCOUNTER — Telehealth: Payer: Self-pay | Admitting: Internal Medicine

## 2018-02-08 DIAGNOSIS — M549 Dorsalgia, unspecified: Secondary | ICD-10-CM

## 2018-02-08 DIAGNOSIS — E876 Hypokalemia: Secondary | ICD-10-CM

## 2018-02-08 DIAGNOSIS — R450 Nervousness: Secondary | ICD-10-CM

## 2018-02-08 DIAGNOSIS — F411 Generalized anxiety disorder: Secondary | ICD-10-CM

## 2018-02-08 DIAGNOSIS — F419 Anxiety disorder, unspecified: Principal | ICD-10-CM

## 2018-02-08 DIAGNOSIS — Z811 Family history of alcohol abuse and dependence: Secondary | ICD-10-CM

## 2018-02-08 DIAGNOSIS — M2550 Pain in unspecified joint: Secondary | ICD-10-CM

## 2018-02-08 DIAGNOSIS — F329 Major depressive disorder, single episode, unspecified: Secondary | ICD-10-CM

## 2018-02-08 DIAGNOSIS — G4700 Insomnia, unspecified: Secondary | ICD-10-CM

## 2018-02-08 DIAGNOSIS — Z818 Family history of other mental and behavioral disorders: Secondary | ICD-10-CM

## 2018-02-08 MED ORDER — DULOXETINE HCL 30 MG PO CPEP: ORAL_CAPSULE | ORAL | 0 refills | Status: DC

## 2018-02-08 MED ORDER — SERTRALINE HCL 50 MG PO TABS: ORAL_TABLET | ORAL | 0 refills | Status: DC

## 2018-02-08 MED FILL — DULoxetine HCL 30 MG CPEP: 30 | 10 days supply | Qty: 10 | Fill #0

## 2018-02-08 MED FILL — SERTRALINE HCL 50 MG TABS: 50 | 30 days supply | Qty: 30 | Fill #0

## 2018-02-08 NOTE — Telephone Encounter (Signed)
Patient stopped by the office and wanted to know when could she get her potassium checked again. Please fu at your earliest convenience.

## 2018-02-08 NOTE — Progress Notes (Addendum)
BH MD/PA/NP OP Progress Note  2018-02-23 11:23 AM BRIANTE LOVEALL  MRN:  [id number]  Chief Complaint: My Dr. want me to see a psychiatrist.  HPI: Amy Perez is a 52 year old African-American employed female who has seen Dr. Sharlene Dory in November as initial evaluation came for her appointment.  She was prescribed Cymbalta 60 mg.  She tried 60 mg but also reported having side effects including shortness of breath, numbness, skin irritation and gain weight.  She also stopped seeing Charolotte Eke for therapy.  She felt that she did not connect with a therapist.  She like to try a different therapist.  Despite taking Cymbalta she continued to feel depressed, anxious, trouble trusting others and poor sleep.  She feels tired and exhausted.  She admitted reading too much about the medication on the Internet and she is not comfortable taking psychiatric medication.  But she realized she need to take something because her symptoms are not getting better.  And also missed her mother who died in 02-23-2017.  She is struggling and having grief.  Patient works as a [employer]ar[employer] manager and she is concerned because her client is in nursing home.  She also did not try trazodone after reading the side effects but after some encouragement she promised that she will give a try.  She lives with her boyfriend.  She has no children.  She endorsed some time crying spells but denies any feeling of hopelessness, suicidal thoughts or any hallucination.  She did get some time irritated but denies any mood swing, anger or any aggressive behavior.  She reports sometimes having panic attacks and she realized that she need to see a therapist.  She worried about everything.  Her appetite is increased.  She gained weight.  Patient denies drinking alcohol or using any illegal substances.  She is willing to try Zoloft because her mother takes Zoloft and it helps her.  Visit Diagnosis:    ICD-10-CM   1. Anxiety and depression F41.9 DULoxetine (CYMBALTA) 30 MG  capsule   F32.9 sertraline (ZOLOFT) 50 MG tablet    Past Psychiatric History: Reviewed. Patient denies any history of psychiatric inpatient treatment or any suicidal attempt.  Her primary care physician is started her on Cymbalta and Dr. Sharlene Dory increased to 60 mg but patient is having side effects.  She also stopped therapy with Beth.  Past Medical History:  Past Medical History:  Diagnosis Date  . AC (acromioclavicular) joint bone spurs   . Acid reflux   . Anxiety   . Blood dyscrasia   . CTS (carpal tunnel syndrome)    Bilateral  . Depression   . Gastric ulcer   . GERD (gastroesophageal reflux disease)   . Headache   . Heel spur   . History of chicken pox   . Hypertension   . Panic attacks   . Plantar fasciitis   . Sickle cell trait (Divide)   . Tendonitis    Feet    Past Surgical History:  Procedure Laterality Date  . CHOLECYSTECTOMY    . COLONOSCOPY WITH PROPOFOL N/A 01/09/2018   Procedure: COLONOSCOPY WITH PROPOFOL;  Surgeon: Daneil Dolin, MD;  Location: [facility];  Service: Endoscopy;  Laterality: N/A;  8:45am  . TUBAL LIGATION  08/29/04  . WISDOM TOOTH EXTRACTION      Family Psychiatric History: Brother has alcoholism and mother has depression.  Family History:  Family History  Problem Relation Age of Onset  . Hypertension Father 63  Deceased  . Heart attack Father   . Hypertension Mother        Living  . Arthritis Mother   . Diabetes Mother   . Dementia Mother   . Depression Mother   . Arthritis Sister        x2  . Lung cancer Sister        Deceased  . Cancer Other        Paternal & Maternal Aunts  . Heart attack Maternal Grandmother   . Neuropathy Sister   . Heart disease Brother        x1  . Alcoholism Brother        x2  . Colon cancer Neg Hx     Social History:  Social History   Socioeconomic History  . Marital status: Single    Spouse name: Not on file  . Number of children: Not on file  . Years of education: Not on file  .  Highest education level: Not on file  Occupational History  . Not on file  Social Needs  . Financial resource strain: Not on file  . Food insecurity:    Worry: Not on file    Inability: Not on file  . Transportation needs:    Medical: Not on file    Non-medical: Not on file  Tobacco Use  . Smoking status: Never Smoker  . Smokeless tobacco: Never Used  Substance and Sexual Activity  . Alcohol use: No  . Drug use: No  . Sexual activity: Not on file  Lifestyle  . Physical activity:    Days per week: Not on file    Minutes per session: Not on file  . Stress: Not on file  Relationships  . Social connections:    Talks on phone: Not on file    Gets together: Not on file    Attends religious service: Not on file    Active member of club or organization: Not on file    Attends meetings of clubs or organizations: Not on file    Relationship status: Not on file  Other Topics Concern  . Not on file  Social History Narrative  . Not on file    Allergies:  Allergies  Allergen Reactions  . Benadryl [Diphenhydramine Hcl] Itching  . Milk-Related Compounds Diarrhea  . Oxycodone-Acetaminophen Nausea And Vomiting  . Penicillins Nausea Only    Amoxicillin Has patient had a PCN reaction causing immediate rash, facial/tongue/throat swelling, SOB or lightheadedness with hypotension:__________ Has patient had a PCN reaction causing severe rash involving mucus membranes or skin necrosis:________ Has patient had a PCN reaction that required hospitalization:________ Has patient had a PCN reaction occurring within the last 10 years: ________ If all of the above answers are "NO", then may proceed with Cephalosporin use.     Metabolic Disorder Labs: Recent Results (from the past 2160 hour(s))  CBC with Differential/Platelet     Status: Abnormal   Collection Time: 01/02/18 11:38 AM  Result Value Ref Range   WBC 5.5 4.0 - 10.5 K/uL   RBC 4.79 3.87 - 5.11 MIL/uL   Hemoglobin 12.1 12.0 - 15.0  g/dL   HCT 37.2 36.0 - 46.0 %   MCV 77.7 (L) 78.0 - 100.0 fL   MCH 25.3 (L) 26.0 - 34.0 pg   MCHC 32.5 30.0 - 36.0 g/dL   RDW 16.8 (H) 11.5 - 15.5 %   Platelets 349 150 - 400 K/uL   Neutrophils Relative % 53 %  Neutro Abs 2.9 1.7 - 7.7 K/uL   Lymphocytes Relative 35 %   Lymphs Abs 1.9 0.7 - 4.0 K/uL   Monocytes Relative 6 %   Monocytes Absolute 0.3 0.1 - 1.0 K/uL   Eosinophils Relative 6 %   Eosinophils Absolute 0.3 0.0 - 0.7 K/uL   Basophils Relative 0 %   Basophils Absolute 0.0 0.0 - 0.1 K/uL    Comment: Performed at [facility], [address], [place], [place] [address]  Basic metabolic panel     Status: Abnormal   Collection Time: 01/02/18 11:38 AM  Result Value Ref Range   Sodium 141 135 - 145 mmol/L   Potassium 2.4 (LL) 3.5 - 5.1 mmol/L    Comment: CRITICAL RESULT CALLED TO, READ BACK BY AND VERIFIED WITH: TERESA,W AT 1510 ON 3.11.2019 BY ISLEY,B    Chloride 102 101 - 111 mmol/L   CO2 27 22 - 32 mmol/L   Glucose, Bld 90 65 - 99 mg/dL   BUN 6 6 - 20 mg/dL   Creatinine, Ser 0.88 0.44 - 1.00 mg/dL   Calcium 9.2 8.9 - 10.3 mg/dL   GFR calc non Af Amer >60 >60 mL/min   GFR calc Af Amer >60 >60 mL/min    Comment: (NOTE) The eGFR has been calculated using the CKD EPI equation. This calculation has not been validated in all clinical situations. eGFR's persistently <60 mL/min signify possible Chronic Kidney Disease.    Anion gap 12 5 - 15    Comment: Performed at [facility], [address] [place], [place] [address]  hCG, serum, qualitative     Status: None   Collection Time: 01/02/18 11:38 AM  Result Value Ref Range   Preg, Serum NEGATIVE NEGATIVE    Comment:        THE SENSITIVITY OF THIS METHODOLOGY IS >10 mIU/mL. Performed at [facility], [address], [place], [place] [address]   Basic metabolic panel     Status: Abnormal   Collection Time: 01/03/18  1:30 PM  Result Value Ref Range   Sodium 142 135 - 145 mmol/L   Potassium 3.3 (L) 3.5 - 5.1 mmol/L    Chloride 102 101 - 111 mmol/L   CO2 31 22 - 32 mmol/L   Glucose, Bld 89 65 - 99 mg/dL   BUN 7 6 - 20 mg/dL   Creatinine, Ser 0.90 0.44 - 1.00 mg/dL   Calcium 9.7 8.9 - 10.3 mg/dL   GFR calc non Af Amer >60 >60 mL/min   GFR calc Af Amer >60 >60 mL/min    Comment: (NOTE) The eGFR has been calculated using the CKD EPI equation. This calculation has not been validated in all clinical situations. eGFR's persistently <60 mL/min signify possible Chronic Kidney Disease.    Anion gap 9 5 - 15    Comment: Performed at [facility], [place] [address], [place], [place] [address]  Magnesium     Status: Abnormal   Collection Time: 01/03/18  1:30 PM  Result Value Ref Range   Magnesium 1.6 (L) 1.7 - 2.4 mg/dL    Comment: Performed at [facility], [place] [address], [place], [place] [address]  Basic metabolic panel     Status: Abnormal   Collection Time: 01/06/18 12:50 PM  Result Value Ref Range   Sodium 140 135 - 145 mmol/L   Potassium 3.3 (L) 3.5 - 5.1 mmol/L   Chloride 102 101 - 111 mmol/L   CO2 28 22 - 32  mmol/L   Glucose, Bld 97 65 - 99 mg/dL   BUN 12 6 - 20 mg/dL   Creatinine, Ser 0.99 0.44 - 1.00 mg/dL   Calcium 9.4 8.9 - 10.3 mg/dL   GFR calc non Af Amer >60 >60 mL/min   GFR calc Af Amer >60 >60 mL/min    Comment: (NOTE) The eGFR has been calculated using the CKD EPI equation. This calculation has not been validated in all clinical situations. eGFR's persistently <60 mL/min signify possible Chronic Kidney Disease.    Anion gap 10 5 - 15    Comment: Performed at [facility], [place] [address] [place], [place] [address]  Potassium     Status: None   Collection Time: 01/12/18 11:54 AM  Result Value Ref Range   Potassium 3.5 3.5 - 5.2 mmol/L  Potassium     Status: Abnormal   Collection Time: 01/20/18  3:23 PM  Result Value Ref Range   Potassium 3.3 (L) 3.5 - 5.2 mmol/L   Lab Results  Component Value Date   HGBA1C 5.4  04/21/2017   No results found for: PROLACTIN Lab Results  Component Value Date   CHOL 245 (H) 09/23/2017   TRIG 124 09/23/2017   HDL 51 09/23/2017   CHOLHDL 4.8 (H) 09/23/2017   LDLCALC 169 (H) 09/23/2017   No results found for: TSH  Therapeutic Level Labs: No results found for: LITHIUM No results found for: VALPROATE No components found for:  CBMZ  Current Medications: Current Outpatient Medications  Medication Sig Dispense Refill  . atorvastatin (LIPITOR) 10 MG tablet Take 1 tablet (10 mg total) by mouth daily. 90 tablet 3  . DULoxetine (CYMBALTA) 30 MG capsule Take one capsule for 10 days and than stop 10 capsule 0  . esomeprazole (NEXIUM) 40 MG capsule TAKE 1 CAPSULE BY MOUTH DAILY. 30 capsule 2  . fluticasone (FLONASE) 50 MCG/ACT nasal spray Place 2 sprays into both nostrils daily. 16 g 0  . hydrochlorothiazide (HYDRODIURIL) 25 MG tablet TAKE 1 TABLET BY MOUTH DAILY. 90 tablet 0  . loratadine (CLARITIN) 10 MG tablet Take 1 tablet (10 mg total) by mouth daily. 30 tablet 2  . meloxicam (MOBIC) 7.5 MG tablet TAKE 1 TABLET BY MOUTH DAILY. 30 tablet 0  . Multiple Vitamin (MULTIVITAMIN) tablet Take 1 tablet by mouth daily. Reported on 04/28/2016    . pantoprazole (PROTONIX) 40 MG tablet TAKE 1 TABLET (40 MG TOTAL) BY MOUTH DAILY. 30 tablet 6  . polyethylene glycol powder (GLYCOLAX/MIRALAX) powder Take 17 g by mouth daily as needed. (Patient taking differently: Take 17 g by mouth daily as needed (constipation). ) 3350 g 5  . potassium chloride 20 MEQ TBCR Take 20 mEq by mouth daily. 30 tablet 6  . traZODone (DESYREL) 100 MG tablet Take 50 mg by mouth at bedtime.    . gabapentin (NEURONTIN) 300 MG capsule Take 1 capsule (300 mg total) by mouth at bedtime. (Patient not taking: Reported on 12/29/2017) 30 capsule 3  . sertraline (ZOLOFT) 50 MG tablet Take 1/2 tab for 10 days and than full tab daily 30 tablet 0   No current facility-administered medications for this visit.       Musculoskeletal: Strength & Muscle Tone: within normal limits Gait & Station: normal Patient leans: N/A  Psychiatric Specialty Exam: Review of Systems  Constitutional: Positive for weight loss.  HENT: Negative.   Respiratory: Negative.   Cardiovascular: Negative.   Genitourinary: Negative.   Musculoskeletal: Positive for back pain and  joint pain.  Skin: Negative.   Neurological: Positive for tingling.  Psychiatric/Behavioral: Positive for depression. The patient is nervous/anxious and has insomnia.     Blood pressure (!) 166/98, pulse 100, height _0  (1.676 m), weight 282 lb (127.9 kg).Body mass index is 45.52 kg/m.  General Appearance: Casual and Guarded  Eye Contact:  Fair  Speech:  Clear and Coherent  Volume:  Normal  Mood:  Anxious and Dysphoric  Affect:  Congruent  Thought Process:  Goal Directed  Orientation:  Full (Time, Place, and Person)  Thought Content: Rumination   Suicidal Thoughts:  No  Homicidal Thoughts:  No  Memory:  Immediate;   Fair Recent;   Fair Remote;   Fair  Judgement:  Fair  Insight:  Fair  Psychomotor Activity:  Decreased  Concentration:  Concentration: Fair and Attention Span: Fair  Recall:  [employer] of Knowledge: Good  Language: Good  Akathisia:  No  Handed:  Right  AIMS (if indicated): not done  Assets:  Communication Skills Desire for Improvement Housing Transportation  ADL's:  Intact  Cognition: WNL  Sleep:  Fair   Screenings: GAD-7     Office Visit from 12/23/2017 in [place] Office Visit from 11/08/2017 in [place] Office Visit from 09/23/2017 in [place] Office Visit from 04/21/2017 in [place]  Total GAD-7 Score  _1 PHQ2-9     Office Visit from 12/23/2017 in [place] Office Visit from 11/08/2017 in [place]  Office Visit from 09/23/2017 in [place] Office Visit from 04/21/2017 in [place] Office Visit from 11/03/2016 in [place]  PHQ-2 Total Score  _2 0  PHQ-9 Total Score  _3 -  0       Assessment and Plan: Major depressive disorder, recurrent.  Generalized anxiety disorder.  Grief.  I review her records, current medication and psychosocial stressors.  Patient has multiple somatic complaints about Cymbalta.  She also believe weight gain but actually she had weight loss since the last visit.  She does not want to take Cymbalta and she did not try trazodone after reading the side effects.  I recommended to try Zoloft because her mother takes Zoloft and she believe it is working very well for her.  We do cross taper.  She would reduce Cymbalta 30 mg for 10 days and then to stop and she will start Zoloft 25 mg for 10 days and then 50 mg daily.  I had a long discussion about medication side effects.  She also willing to try trazodone for insomnia which she has not tried yet.  I also encouraged to see a therapist for CBT and grief counseling and we will provide a list of therapists in the area so she can a scheduled appointment.  Time spent 25 minutes.  More than 50% of the time spent in psychoeducation, counseling and coordination of care.  Discuss safety plan that anytime having active suicidal thoughts or homicidal thoughts then patient need to call 911 or go to the local emergency room.  Follow-up in 4 weeks.   Kathlee Nations, MD 02/08/2018, 11:23 AM

## 2018-02-08 NOTE — Telephone Encounter (Signed)
Will forward to pcp  Dr. Laural BenesJohnson looked at last lab for K and you just informed her to continue K supplements and sent a refill to pharmacy. No information on when she should come back to recheck.

## 2018-02-09 ENCOUNTER — Telehealth (HOSPITAL_COMMUNITY): Payer: Self-pay

## 2018-02-09 NOTE — Telephone Encounter (Signed)
That does not sound like a zoloft issue. Could be that she is coming off the cymbalta and that may be contributing

## 2018-02-09 NOTE — Telephone Encounter (Signed)
Contacted pt on 02/08/2018 and made her aware that she can come in whenever to have K checked and the order has been placed as future order.

## 2018-02-09 NOTE — Telephone Encounter (Signed)
This is an Arfeen patient, she was started on Zoloft yesterday and said she woke up this morning with severe pain and spasms in her neck. I was not sure if this could be caused by the medication or if patient just slept wrong...please review and advise, thank you

## 2018-02-10 ENCOUNTER — Telehealth (HOSPITAL_COMMUNITY): Payer: Self-pay | Admitting: *Deleted

## 2018-02-10 NOTE — Telephone Encounter (Signed)
Medication management - Message left for patient to inform of Dr. Unice BaileyEksir's assessment of possible pain causes and to question if patient still having problems. Requested patient call back to discuss if still experiencing concerns.

## 2018-02-10 NOTE — Telephone Encounter (Signed)
Pt called with complaint of feeling too drowsy and "not well" after starting new meds. She is requesting follow up call to discuss concerns ith symptoms. Will forward to Dr. Lolly MustacheArfeen

## 2018-02-13 ENCOUNTER — Other Ambulatory Visit (HOSPITAL_COMMUNITY): Payer: Self-pay | Admitting: Psychiatry

## 2018-02-13 NOTE — Telephone Encounter (Signed)
Medication management - Telephone call with patient to inform of Dr. Sheela StackArfeen's advice about medicatoins as patient stated she was not going to take anything but reduced dosage of Cymbalta to 30 mg a day. Reports this is working fine now and not going to take Zoloft, Trazodone or any other medications at this time.  States she will discuss more with Dr. Lolly MustacheArfeen at next evaluation and will call back if any other problems prior to appt.

## 2018-02-13 NOTE — Telephone Encounter (Signed)
She can stop trazodone and try to take Zoloft at night.  Medicine takes few days to adjust in the body and these symptoms may go away after a few days.

## 2018-02-17 ENCOUNTER — Other Ambulatory Visit (HOSPITAL_COMMUNITY): Payer: Self-pay

## 2018-02-17 DIAGNOSIS — F419 Anxiety disorder, unspecified: Principal | ICD-10-CM

## 2018-02-17 DIAGNOSIS — F329 Major depressive disorder, single episode, unspecified: Secondary | ICD-10-CM

## 2018-02-17 MED ORDER — DULOXETINE HCL 30 MG PO CPEP: ORAL_CAPSULE | ORAL | 0 refills | Status: DC

## 2018-02-17 MED FILL — DULoxetine HCL 30 MG CPEP: 30 | 10 days supply | Qty: 10 | Fill #0

## 2018-02-20 ENCOUNTER — Ambulatory Visit: Payer: Self-pay | Attending: Internal Medicine

## 2018-02-20 ENCOUNTER — Other Ambulatory Visit: Payer: Self-pay | Admitting: Internal Medicine

## 2018-02-20 DIAGNOSIS — E876 Hypokalemia: Secondary | ICD-10-CM | POA: Insufficient documentation

## 2018-02-20 DIAGNOSIS — M1711 Unilateral primary osteoarthritis, right knee: Secondary | ICD-10-CM

## 2018-02-20 MED FILL — MELOXICAM 7.5 MG TABLET: 7.5 | 30 days supply | Qty: 30 | Fill #0

## 2018-02-20 NOTE — Progress Notes (Signed)
Patient here for lab visit only 

## 2018-02-21 ENCOUNTER — Telehealth: Payer: Self-pay | Admitting: Internal Medicine

## 2018-02-21 LAB — POTASSIUM: Potassium: 3.3 mmol/L — ABNORMAL LOW (ref 3.5–5.2)

## 2018-02-21 MED ORDER — AMLODIPINE BESYLATE 5 MG PO TABS: 5.0000 mg | ORAL_TABLET | Freq: Every day | ORAL | 3 refills | Status: DC

## 2018-02-21 NOTE — Telephone Encounter (Signed)
Will forward to pcp

## 2018-02-21 NOTE — Telephone Encounter (Signed)
Patient was informed and verbalized understanding of potassium results.  Patient wanted to inform pcp that she is still having cramping in her hands. Patient also stated she would like to try the amlodipine and discontinue the hydrochlorothiazide and potassium. Please fu you at your earliest convenience.

## 2018-02-22 ENCOUNTER — Encounter: Payer: Self-pay | Admitting: Nurse Practitioner

## 2018-02-22 MED FILL — ?AMLODIPINE BESYLATE 5 MG T: 5 MG | 30 days supply | Qty: 30 | Fill #0

## 2018-02-23 NOTE — Telephone Encounter (Signed)
Pt is aware.  

## 2018-02-27 MED FILL — ?ATORVASTATIN 10MG TABLET: 10 | 30 days supply | Qty: 30 | Fill #5

## 2018-03-02 ENCOUNTER — Ambulatory Visit (INDEPENDENT_AMBULATORY_CARE_PROVIDER_SITE_OTHER): Payer: No Typology Code available for payment source | Admitting: Psychiatry

## 2018-03-02 ENCOUNTER — Other Ambulatory Visit (HOSPITAL_COMMUNITY): Payer: Self-pay

## 2018-03-02 ENCOUNTER — Encounter (HOSPITAL_COMMUNITY): Payer: Self-pay | Admitting: Psychiatry

## 2018-03-02 VITALS — BP 136/87 | HR 90 | Ht 66.0 in | Wt 289.0 lb

## 2018-03-02 DIAGNOSIS — F419 Anxiety disorder, unspecified: Principal | ICD-10-CM

## 2018-03-02 DIAGNOSIS — Z818 Family history of other mental and behavioral disorders: Secondary | ICD-10-CM

## 2018-03-02 DIAGNOSIS — F329 Major depressive disorder, single episode, unspecified: Secondary | ICD-10-CM

## 2018-03-02 DIAGNOSIS — Z810 Family history of intellectual disabilities: Secondary | ICD-10-CM

## 2018-03-02 DIAGNOSIS — F411 Generalized anxiety disorder: Secondary | ICD-10-CM

## 2018-03-02 DIAGNOSIS — Z9114 Patient's other noncompliance with medication regimen: Secondary | ICD-10-CM

## 2018-03-02 DIAGNOSIS — Z811 Family history of alcohol abuse and dependence: Secondary | ICD-10-CM

## 2018-03-02 MED ORDER — DULOXETINE HCL 40 MG PO CPEP: 40.0000 mg | ORAL_CAPSULE | Freq: Every day | ORAL | 0 refills | Status: DC

## 2018-03-02 MED ORDER — DULOXETINE HCL 30 MG PO CPEP: 30.0000 mg | ORAL_CAPSULE | Freq: Every day | ORAL | 0 refills | Status: DC

## 2018-03-02 MED FILL — DULoxetine HCL 20 MG CPEP: 20 | 30 days supply | Qty: 60 | Fill #0

## 2018-03-02 NOTE — Progress Notes (Signed)
BH MD/PA/NP OP Progress Note  03/02/2018 3:38 PM Amy Perez  MRN:  [id number]  Chief Complaint: I stopped taking Zoloft after 2 pills.  It was making to be hungry, tired and jittery.  HPI: Amy Perez came for her follow-up appointment.  On her last visit she complained about Cymbalta 60 mg which was causing shortness of breath.  We prescribed Zoloft but after taking 2 dose she stopped because of feeling anxious, hungry, tired and reported having shakes.  She also admitted reading too much about the medication side effects.  For the same reason she never tried trazodone.  But she is sleeping better.  She is back on Cymbalta 30 mg only.  She feels she can tolerate 30 mg but she still feels anxious and nervous.  She gained 7 pounds but it is unclear if it is due to side effects of medication.  Because she has been noncompliant with medication.  She feels some time isolated and having crying spells.  But denies any suicidal thoughts or homicidal thought.  She denies any panic attacks.  She is pleased that her client who went to nursing home is back and she will resume seeing him started Monday.  Patient told this will keep her busy.  Patient denies any shortness of breath with Cymbalta 30 mg.  She denies any tremors shakes or any EPS.  Her energy level is fair.  She lives with her boyfriend.  She has no children.  Recently she seen her primary care physician.  Her potassium is low.  She will again had blood work Architectural technologist.  Visit Diagnosis:    ICD-10-CM   1. GAD (generalized anxiety disorder) F41.1   2. Anxiety and depression F41.9 DULoxetine 40 MG CPEP   F32.9 DISCONTINUED: DULoxetine (CYMBALTA) 30 MG capsule    Past Psychiatric History: Reviewed. Patient denies any history of psychiatric inpatient treatment or any suicidal attempt.  Her primary care physician is started her on Cymbalta and Dr. Sharlene Perez increased to 60 mg but patient is having side effects.  She also stopped therapy with Amy Perez.  Past  Medical History:  Past Medical History:  Diagnosis Date  . AC (acromioclavicular) joint bone spurs   . Acid reflux   . Anxiety   . Blood dyscrasia   . CTS (carpal tunnel syndrome)    Bilateral  . Depression   . Gastric ulcer   . GERD (gastroesophageal reflux disease)   . Headache   . Heel spur   . History of chicken pox   . Hypertension   . Panic attacks   . Plantar fasciitis   . Sickle cell trait ([place])   . Tendonitis    Feet    Past Surgical History:  Procedure Laterality Date  . CHOLECYSTECTOMY    . COLONOSCOPY WITH PROPOFOL N/A 01/09/2018   Procedure: COLONOSCOPY WITH PROPOFOL;  Surgeon: Daneil Dolin, MD;  Location: [facility];  Service: Endoscopy;  Laterality: N/A;  8:45am  . TUBAL LIGATION  08/29/04  . WISDOM TOOTH EXTRACTION      Family Psychiatric History: Reviewed.  Family History:  Family History  Problem Relation Age of Onset  . Hypertension Father 35       Deceased  . Heart attack Father   . Hypertension Mother        Living  . Arthritis Mother   . Diabetes Mother   . Dementia Mother   . Depression Mother   . Arthritis Sister  x2  . Lung cancer Sister        Deceased  . Cancer Other        Paternal & Maternal Aunts  . Heart attack Maternal Grandmother   . Neuropathy Sister   . Heart disease Brother        x1  . Alcoholism Brother        x2  . Colon cancer Neg Hx     Social History:  Social History   Socioeconomic History  . Marital status: Single    Spouse name: Not on file  . Number of children: Not on file  . Years of education: Not on file  . Highest education level: Not on file  Occupational History  . Not on file  Social Needs  . Financial resource strain: Not on file  . Food insecurity:    Worry: Not on file    Inability: Not on file  . Transportation needs:    Medical: Not on file    Non-medical: Not on file  Tobacco Use  . Smoking status: Never Smoker  . Smokeless tobacco: Never Used  Substance and Sexual  Activity  . Alcohol use: No  . Drug use: No  . Sexual activity: Not on file  Lifestyle  . Physical activity:    Days per week: Not on file    Minutes per session: Not on file  . Stress: Not on file  Relationships  . Social connections:    Talks on phone: Not on file    Gets together: Not on file    Attends religious service: Not on file    Active member of club or organization: Not on file    Attends meetings of clubs or organizations: Not on file    Relationship status: Not on file  Other Topics Concern  . Not on file  Social History Narrative  . Not on file    Allergies:  Allergies  Allergen Reactions  . Benadryl [Diphenhydramine Hcl] Itching  . Milk-Related Compounds Diarrhea  . Oxycodone-Acetaminophen Nausea And Vomiting  . Penicillins Nausea Only    Amoxicillin Has patient had a PCN reaction causing immediate rash, facial/tongue/throat swelling, SOB or lightheadedness with hypotension:__________ Has patient had a PCN reaction causing severe rash involving mucus membranes or skin necrosis:________ Has patient had a PCN reaction that required hospitalization:________ Has patient had a PCN reaction occurring within the last 10 years: ________ If all of the above answers are "NO", then may proceed with Cephalosporin use.     Metabolic Disorder Labs: Recent Results (from the past 2160 hour(s))  CBC with Differential/Platelet     Status: Abnormal   Collection Time: 01/02/18 11:38 AM  Result Value Ref Range   WBC 5.5 4.0 - 10.5 K/uL   RBC 4.79 3.87 - 5.11 MIL/uL   Hemoglobin 12.1 12.0 - 15.0 g/dL   HCT 37.2 36.0 - 46.0 %   MCV 77.7 (L) 78.0 - 100.0 fL   MCH 25.3 (L) 26.0 - 34.0 pg   MCHC 32.5 30.0 - 36.0 g/dL   RDW 16.8 (H) 11.5 - 15.5 %   Platelets 349 150 - 400 K/uL   Neutrophils Relative % 53 %   Neutro Abs 2.9 1.7 - 7.7 K/uL   Lymphocytes Relative 35 %   Lymphs Abs 1.9 0.7 - 4.0 K/uL   Monocytes Relative 6 %   Monocytes Absolute 0.3 0.1 - 1.0 K/uL    Eosinophils Relative 6 %   Eosinophils Absolute 0.3 0.0 -  0.7 K/uL   Basophils Relative 0 %   Basophils Absolute 0.0 0.0 - 0.1 K/uL    Comment: Performed at [facility], [address], [place], [place] [address]  Basic metabolic panel     Status: Abnormal   Collection Time: 01/02/18 11:38 AM  Result Value Ref Range   Sodium 141 135 - 145 mmol/L   Potassium 2.4 (LL) 3.5 - 5.1 mmol/L    Comment: CRITICAL RESULT CALLED TO, READ BACK BY AND VERIFIED WITH: TERESA,W AT 1510 ON 3.11.2019 BY ISLEY,B    Chloride 102 101 - 111 mmol/L   CO2 27 22 - 32 mmol/L   Glucose, Bld 90 65 - 99 mg/dL   BUN 6 6 - 20 mg/dL   Creatinine, Ser 0.88 0.44 - 1.00 mg/dL   Calcium 9.2 8.9 - 10.3 mg/dL   GFR calc non Af Amer >60 >60 mL/min   GFR calc Af Amer >60 >60 mL/min    Comment: (NOTE) The eGFR has been calculated using the CKD EPI equation. This calculation has not been validated in all clinical situations. eGFR's persistently <60 mL/min signify possible Chronic Kidney Disease.    Anion gap 12 5 - 15    Comment: Performed at [facility], [address], [place], [place] [address]  hCG, serum, qualitative     Status: None   Collection Time: 01/02/18 11:38 AM  Result Value Ref Range   Preg, Serum NEGATIVE NEGATIVE    Comment:        THE SENSITIVITY OF THIS METHODOLOGY IS >10 mIU/mL. Performed at [facility], [address], [place], [place] [address]   Basic metabolic panel     Status: Abnormal   Collection Time: 01/03/18  1:30 PM  Result Value Ref Range   Sodium 142 135 - 145 mmol/L   Potassium 3.3 (L) 3.5 - 5.1 mmol/L   Chloride 102 101 - 111 mmol/L   CO2 31 22 - 32 mmol/L   Glucose, Bld 89 65 - 99 mg/dL   BUN 7 6 - 20 mg/dL   Creatinine, Ser 0.90 0.44 - 1.00 mg/dL   Calcium 9.7 8.9 - 10.3 mg/dL   GFR calc non Af Amer >60 >60 mL/min   GFR calc Af Amer >60 >60 mL/min    Comment: (NOTE) The eGFR has been calculated using the CKD EPI equation. This calculation has not been validated in all  clinical situations. eGFR's persistently <60 mL/min signify possible Chronic Kidney Disease.    Anion gap 9 5 - 15    Comment: Performed at [facility], [place] [address] [place], [place] [address]  Magnesium     Status: Abnormal   Collection Time: 01/03/18  1:30 PM  Result Value Ref Range   Magnesium 1.6 (L) 1.7 - 2.4 mg/dL    Comment: Performed at [facility], [place] [address] [address], [place],  [address]  Basic metabolic panel     Status: Abnormal   Collection Time: 01/06/18 12:50 PM  Result Value Ref Range   Sodium 140 135 - 145 mmol/L   Potassium 3.3 (L) 3.5 - 5.1 mmol/L   Chloride 102 101 - 111 mmol/L   CO2 28 22 - 32 mmol/L   Glucose, Bld 97 65 - 99 mg/dL   BUN 12 6 - 20 mg/dL   Creatinine, Ser 0.99 0.44 - 1.00 mg/dL   Calcium 9.4 8.9 - 10.3 mg/dL   GFR calc non Af Amer >60 >60 mL/min   GFR calc Af  Amer >60 >60 mL/min    Comment: (NOTE) The eGFR has been calculated using the CKD EPI equation. This calculation has not been validated in all clinical situations. eGFR's persistently <60 mL/min signify possible Chronic Kidney Disease.    Anion gap 10 5 - 15    Comment: Performed at [facility], [place] [address] [place], [place] [address]  Potassium     Status: None   Collection Time: 01/12/18 11:54 AM  Result Value Ref Range   Potassium 3.5 3.5 - 5.2 mmol/L  Potassium     Status: Abnormal   Collection Time: 01/20/18  3:23 PM  Result Value Ref Range   Potassium 3.3 (L) 3.5 - 5.2 mmol/L  Potassium     Status: Abnormal   Collection Time: 02/20/18  2:12 PM  Result Value Ref Range   Potassium 3.3 (L) 3.5 - 5.2 mmol/L   Lab Results  Component Value Date   HGBA1C 5.4 04/21/2017   No results found for: PROLACTIN Lab Results  Component Value Date   CHOL 245 (H) 09/23/2017   TRIG 124 09/23/2017   HDL 51 09/23/2017   CHOLHDL 4.8 (H) 09/23/2017   LDLCALC 169 (H) 09/23/2017   No results found for: TSH  Therapeutic  Level Labs: No results found for: LITHIUM No results found for: VALPROATE No components found for:  CBMZ  Current Medications: Current Outpatient Medications  Medication Sig Dispense Refill  . amLODipine (NORVASC) 5 MG tablet Take 1 tablet (5 mg total) by mouth daily. 90 tablet 3  . atorvastatin (LIPITOR) 10 MG tablet Take 1 tablet (10 mg total) by mouth daily. 90 tablet 3  . DULoxetine (CYMBALTA) 30 MG capsule Take one capsule for 10 days and than stop 30 capsule 0  . esomeprazole (NEXIUM) 40 MG capsule TAKE 1 CAPSULE BY MOUTH DAILY. 30 capsule 2  . fluticasone (FLONASE) 50 MCG/ACT nasal spray Place 2 sprays into both nostrils daily. 16 g 0  . gabapentin (NEURONTIN) 300 MG capsule Take 1 capsule (300 mg total) by mouth at bedtime. (Patient not taking: Reported on 12/29/2017) 30 capsule 3  . loratadine (CLARITIN) 10 MG tablet Take 1 tablet (10 mg total) by mouth daily. 30 tablet 2  . meloxicam (MOBIC) 7.5 MG tablet TAKE 1 TABLET BY MOUTH DAILY. 30 tablet 0  . Multiple Vitamin (MULTIVITAMIN) tablet Take 1 tablet by mouth daily. Reported on 04/28/2016    . pantoprazole (PROTONIX) 40 MG tablet TAKE 1 TABLET (40 MG TOTAL) BY MOUTH DAILY. 30 tablet 6  . polyethylene glycol powder (GLYCOLAX/MIRALAX) powder Take 17 g by mouth daily as needed. (Patient taking differently: Take 17 g by mouth daily as needed (constipation). ) 3350 g 5   No current facility-administered medications for this visit.      Musculoskeletal: Strength & Muscle Tone: within normal limits Gait & Station: normal Patient leans: N/A  Psychiatric Specialty Exam: ROS  There were no vitals taken for this visit.There is no height or weight on file to calculate BMI.  General Appearance: Casual and Guarded  Eye Contact:  Fair  Speech:  Slow  Volume:  Decreased  Mood:  Anxious  Affect:  Congruent  Thought Process:  Goal Directed  Orientation:  Full (Time, Place, and Person)  Thought Content: Rumination   Suicidal Thoughts:   No  Homicidal Thoughts:  No  Memory:  Immediate;   Fair Recent;   Fair Remote;   Fair  Judgement:  Fair  Insight:  Fair  Psychomotor Activity:  Decreased  Concentration:  Concentration: Fair and Attention Span: Fair  Recall:  Good  Fund of Knowledge: Good  Language: Good  Akathisia:  No  Handed:  Right  AIMS (if indicated): not done  Assets:  Communication Skills Desire for Improvement Housing Social Support  ADL's:  Intact  Cognition: WNL  Sleep:  Good   Screenings: GAD-7     Office Visit from 12/23/2017 in [place] Office Visit from 11/08/2017 in [place] Office Visit from 09/23/2017 in [place] Office Visit from 04/21/2017 in [place]  Total GAD-7 Score  '14  12  14  18    ' PHQ2-9     Office Visit from 12/23/2017 in [place] Office Visit from 11/08/2017 in [place] Office Visit from 09/23/2017 in [place] Office Visit from 04/21/2017 in [place] Office Visit from 11/03/2016 in [place]  PHQ-2 Total Score  '4  2  4  1  ' 0  PHQ-9 Total Score  '18  16  18  ' -  0       Assessment and Plan: Generalized anxiety disorder.  Major depressive disorder, recurrent.  I had a long discussion with the patient about poorly compliant with medication.  We discussed medication side effects and encouraged that she need to take the medication for at least few weeks to see the response.  After reading the side effects of trazodone she never took it.  After some encouragement she willing to try Cymbalta 40 mg daily.  I will discontinue Zoloft.  I explained if she started to have shortness of breath with Cymbalta then she should call us immediately.  I also believe she should see a therapist and we will schedule  appointment to see therapy in this office.  Recommended to call us back if she has any question or any concern.  Follow-up in 2 months.  Encourage healthy lifestyle and watch her calorie intake.   Kathlee Nations, MD 03/02/2018, 3:38 PM

## 2018-03-07 ENCOUNTER — Ambulatory Visit: Payer: No Typology Code available for payment source | Attending: Internal Medicine

## 2018-03-07 DIAGNOSIS — E876 Hypokalemia: Secondary | ICD-10-CM | POA: Insufficient documentation

## 2018-03-07 NOTE — Progress Notes (Signed)
Patient here for lab visit  

## 2018-03-08 ENCOUNTER — Ambulatory Visit: Payer: Self-pay | Admitting: Nurse Practitioner

## 2018-03-08 LAB — POTASSIUM: POTASSIUM: 3.8 mmol/L (ref 3.5–5.2)

## 2018-03-09 ENCOUNTER — Telehealth: Payer: Self-pay

## 2018-03-09 NOTE — Telephone Encounter (Signed)
Contacted pt to go over lab results pt is aware and doesn't ave any questions or concerns

## 2018-03-13 ENCOUNTER — Telehealth: Payer: Self-pay | Admitting: Internal Medicine

## 2018-03-13 NOTE — Telephone Encounter (Signed)
Pt wants to speak with nurse about her magnesium. Please call her at [phone]

## 2018-03-13 NOTE — Telephone Encounter (Signed)
Spoke with pt and she was taking magnesium and d/c. Pt recently started back due to feeling very tired. pts next apt is 03/27/18 and she wasn't sure if she should continue until her next apt. Pt is wants to discuss her stomach at the next apt. Pt is still having problems with constipation and isn't taking anything for because she feels the medicines affect her potassium level. Pt would like to discuss having a test or scan for her stomach.

## 2018-03-15 ENCOUNTER — Ambulatory Visit (INDEPENDENT_AMBULATORY_CARE_PROVIDER_SITE_OTHER): Payer: No Typology Code available for payment source | Admitting: Psychiatry

## 2018-03-15 DIAGNOSIS — F329 Major depressive disorder, single episode, unspecified: Secondary | ICD-10-CM

## 2018-03-15 DIAGNOSIS — F419 Anxiety disorder, unspecified: Secondary | ICD-10-CM
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
## 2018-03-20 NOTE — Telephone Encounter (Signed)
Yes continue Magnesium as recommended by AB. Is she having diarrhea on Linzess? Diarrhea over longer periods of time (or shorter in duration but more significant in amount) can result in low Potassium. However, the main cause of her low potassium is likely the HCTZ. We will recheck her labs at follow-up visit. We can address her stomach questions as her next appointment.

## 2018-03-21 NOTE — Telephone Encounter (Signed)
Lmom, waiting on a return call.  

## 2018-03-22 MED FILL — ?AMLODIPINE BESYLATE 5 MG T: 5 MG | 30 days supply | Qty: 30 | Fill #1

## 2018-03-22 NOTE — Telephone Encounter (Signed)
Spoke with pt and she will continue her magnesium. Pt d/c Linzess due to the diarrhea it caused. Pt said she will continue to do what she will discuss everything further at the next apt.

## 2018-03-27 ENCOUNTER — Other Ambulatory Visit: Payer: Self-pay | Admitting: Internal Medicine

## 2018-03-27 ENCOUNTER — Ambulatory Visit (INDEPENDENT_AMBULATORY_CARE_PROVIDER_SITE_OTHER): Payer: No Typology Code available for payment source | Admitting: Nurse Practitioner

## 2018-03-27 ENCOUNTER — Encounter: Payer: Self-pay | Admitting: Nurse Practitioner

## 2018-03-27 VITALS — BP 151/96 | HR 87 | Temp 97.4°F | Ht 66.0 in | Wt 284.8 lb

## 2018-03-27 DIAGNOSIS — K5900 Constipation, unspecified: Secondary | ICD-10-CM

## 2018-03-27 DIAGNOSIS — M1711 Unilateral primary osteoarthritis, right knee: Secondary | ICD-10-CM

## 2018-03-27 DIAGNOSIS — K219 Gastro-esophageal reflux disease without esophagitis: Secondary | ICD-10-CM

## 2018-03-27 MED ORDER — RANITIDINE HCL 150 MG PO TABS: 150.0000 mg | ORAL_TABLET | Freq: Every day | ORAL | 2 refills | Status: DC | PRN

## 2018-03-27 MED FILL — MELOXICAM 7.5 MG TABLET: 7.5 | 30 days supply | Qty: 30 | Fill #0

## 2018-03-27 MED FILL — ?DULoxetine HCL 20 MG CPEP: 20 | 30 days supply | Qty: 60 | Fill #1

## 2018-03-27 MED FILL — raNITIdine HCL 150 MG TABS: 150 | 30 days supply | Qty: 30 | Fill #0

## 2018-03-27 MED FILL — ?ATORVASTATIN 10MG TABLET: 10 | 30 days supply | Qty: 30 | Fill #6

## 2018-03-27 NOTE — Assessment & Plan Note (Signed)
Persistent constipation.  Has tried and failed (or had adverse effects with): Linzess, Amitiza 8 mcg.  We currently do not have 24 mcg samples in our office.  I will trial her on true Lance 3 mg once a day to be taken regardless of food.  Call with progress report in 1 to 2 weeks.  Follow-up in 3 months.

## 2018-03-27 NOTE — Assessment & Plan Note (Signed)
Chronic history of GERD, having breakthrough 2-3 times a month.  She is on Nexium daily.  She states intermittent Zantac helped as well.  I will send in a prescription of 150 mg to be taken as needed for breakthrough symptoms.  Follow-up in 3 months.

## 2018-03-27 NOTE — Assessment & Plan Note (Signed)
Previously noted significant hypokalemia which is been monitored and taken over by primary care.  Her last potassium was 3.8.  Previously noted mild low magnesium at 1.6.  She was started on oral magnesium.  This is not been rechecked in some time.  I will recheck her magnesium today.  She may be able to reduce to once a day dosing of magnesium versus discontinuing.  We will follow-up with her.

## 2018-03-27 NOTE — Patient Instructions (Signed)
1. Have your lab test completed when you can. 2. We will call you with the results when we receive them. 3. I sent in a prescription of Zantac 150 mg.  Take this if needed for breakthrough heartburn symptoms despite being on Nexium. 4. We do not have Amitiza 24 mcg samples currently.  Rather than make you wait, I will have you try Trulance 3 mg.  You take this once a day, regardless of food.  Call us in 1 to 2 weeks and let us know if it is helping your constipation. 5. Return for follow-up in 3 months.  At [facility] we value your feedback. You may receive a survey about your visit today. Please share your experience as we strive to create trusting relationships with our patients to provide genuine, compassionate, quality care.  It was great to see you today!  I hope you have a wonderful summer!!

## 2018-03-27 NOTE — Progress Notes (Signed)
Referring Provider: Ladell Pier, MD Primary Care Physician:  Ladell Pier, MD Primary GI:  Dr. Gala Romney  Chief Complaint  Patient presents with  . Constipation    HPI:   Amy Perez is a 52 y.o. female who presents for follow-up on constipation.  The patient was last seen in our office 12/08/2017 for the same as well as to schedule colonoscopy.  She was previously seen by lobe our GI but referred to [place] by the [facility] for unknown reasons.  At her last visit she admitted abdominal pain in the lower abdomen which typically resolves with a bowel movement.  Have bowel movements 2-3 times a week, MiraLAX has not helped.  Noted hard stools with straining.  No other GI symptoms.  Recommended scheduling colonoscopy with propofol/MAC due to polypharmacy.  Recommended Linzess 72 mcg, call us in 1 to 2 weeks with a progress report, follow-up in 3 months.  She called saying Linzess is working well and requested a prescription which was sent to her pharmacy.  On preop labs she was given oral potassium and recommended recheck BMP and magnesium on 01/03/18.   Colonoscopy was completed 01/09/2018 which found normal colon, no specimens collected.  Recommended continue present medications, repeat colonoscopy in 10 years (2029), follow-up with GI in 3 months.   Recheck her potassium improved from 2.4-3.3.  Recommended continue potassium until prescription complete.  Also recommended 400 mg twice daily magnesium due to serum magnesium low at 1.6, recheck BMP on Friday.  Her recheck found potassium stable at 3.3.  It appears primary care's took in over monitoring.  Follow-up serum potassiums 3.5 and 3.3.  Her potassium dose was increased and most recent check on 03/07/2018 found potassium of 3.8.  He called our office asking about constipation medications.  She felt her Linzess was causing her low potassium and magnesium.  Most likely culprit is HCTZ.  Recommended trial of Amitiza  8 mcg twice daily on a full stomach and call with progress report in 5 to 7 days.  Nipples were left at the front for her to pick up.  One final phone call from the patient on 03/13/2018 asking if she should stop magnesium. Also some noted constipation and not taking anything because she feels the medicines affect her potassium level.  She also indicated she would like to discuss having a test or scan for her stomach.  Recommended continue magnesium.  She states she will further discuss her concerns at her next office visit.  Today she states she's ok overall. Isn't taking anything for constipation. Tried Amiza 8 mcg which didn't help. Linzess caused bloating and diarrhea. Has a bowel movement about 2 times a week (is taking fiber supplement which has helped). Still with abdominal pain which improves with a bowel movement. Denies hematochezia, melena, fever, chills, unintentional weight loss. Has GERD, on Nexium, has breakthrough symptoms 3 times a month. Zantac helped previously but doesn't have any. Denies chest pain, dyspnea, dizziness, lightheadedness, syncope, near syncope. Denies any other upper or lower GI symptoms.  Past Medical History:  Diagnosis Date  . AC (acromioclavicular) joint bone spurs   . Acid reflux   . Anxiety   . Blood dyscrasia   . CTS (carpal tunnel syndrome)    Bilateral  . Depression   . Gastric ulcer   . GERD (gastroesophageal reflux disease)   . Headache   . Heel spur   . History of chicken pox   .  Hypertension   . Panic attacks   . Plantar fasciitis   . Sickle cell trait ([place])   . Tendonitis    Feet    Past Surgical History:  Procedure Laterality Date  . CHOLECYSTECTOMY    . COLONOSCOPY WITH PROPOFOL N/A 01/09/2018   Procedure: COLONOSCOPY WITH PROPOFOL;  Surgeon: Daneil Dolin, MD;  Location: [facility];  Service: Endoscopy;  Laterality: N/A;  8:45am  . TUBAL LIGATION  08/29/04  . WISDOM TOOTH EXTRACTION      Current Outpatient Medications    Medication Sig Dispense Refill  . amLODipine (NORVASC) 5 MG tablet Take 1 tablet (5 mg total) by mouth daily. 90 tablet 3  . atorvastatin (LIPITOR) 10 MG tablet Take 1 tablet (10 mg total) by mouth daily. 90 tablet 3  . DULoxetine 40 MG CPEP Take 40 mg by mouth daily. 90 capsule 0  . esomeprazole (NEXIUM) 40 MG capsule TAKE 1 CAPSULE BY MOUTH DAILY. 30 capsule 2  . fluticasone (FLONASE) 50 MCG/ACT nasal spray Place 2 sprays into both nostrils daily. (Patient taking differently: Place 2 sprays into both nostrils as needed. ) 16 g 0  . loratadine (CLARITIN) 10 MG tablet Take 1 tablet (10 mg total) by mouth daily. 30 tablet 2  . meloxicam (MOBIC) 7.5 MG tablet TAKE 1 TABLET BY MOUTH DAILY. 30 tablet 0  . Multiple Vitamin (MULTIVITAMIN) tablet Take 1 tablet by mouth daily. Reported on 04/28/2016    . pantoprazole (PROTONIX) 40 MG tablet TAKE 1 TABLET (40 MG TOTAL) BY MOUTH DAILY. 30 tablet 6  . polyethylene glycol powder (GLYCOLAX/MIRALAX) powder Take 17 g by mouth daily as needed. (Patient taking differently: Take 17 g by mouth daily as needed (constipation). ) 3350 g 5  . gabapentin (NEURONTIN) 300 MG capsule Take 1 capsule (300 mg total) by mouth at bedtime. (Patient not taking: Reported on 03/27/2018) 30 capsule 3   No current facility-administered medications for this visit.     Allergies as of 03/27/2018 - Review Complete 03/27/2018  Allergen Reaction Noted  . Benadryl [diphenhydramine hcl] Itching 11/06/2012  . Milk-related compounds Diarrhea 09/22/2016  . Oxycodone-acetaminophen Nausea And Vomiting 02/27/2007  . Penicillins Nausea Only 02/27/2007    Family History  Problem Relation Age of Onset  . Hypertension Father 32       Deceased  . Heart attack Father   . Hypertension Mother        Living  . Arthritis Mother   . Diabetes Mother   . Dementia Mother   . Depression Mother   . Arthritis Sister        x2  . Lung cancer Sister        Deceased  . Cancer Other        Paternal  & Maternal Aunts  . Heart attack Maternal Grandmother   . Neuropathy Sister   . Heart disease Brother        x1  . Alcoholism Brother        x2  . Colon cancer Neg Hx     Social History   Socioeconomic History  . Marital status: Single    Spouse name: Not on file  . Number of children: Not on file  . Years of education: Not on file  . Highest education level: Not on file  Occupational History  . Not on file  Social Needs  . Financial resource strain: Not on file  . Food insecurity:    Worry: Not on file  Inability: Not on file  . Transportation needs:    Medical: Not on file    Non-medical: Not on file  Tobacco Use  . Smoking status: Never Smoker  . Smokeless tobacco: Never Used  Substance and Sexual Activity  . Alcohol use: No  . Drug use: No  . Sexual activity: Not on file  Lifestyle  . Physical activity:    Days per week: Not on file    Minutes per session: Not on file  . Stress: Not on file  Relationships  . Social connections:    Talks on phone: Not on file    Gets together: Not on file    Attends religious service: Not on file    Active member of club or organization: Not on file    Attends meetings of clubs or organizations: Not on file    Relationship status: Not on file  Other Topics Concern  . Not on file  Social History Narrative  . Not on file    Review of Systems: General: Negative for anorexia, weight loss, fever, chills, fatigue, weakness. ENT: Negative for hoarseness, difficulty swallowing , nasal congestion. CV: Negative for chest pain, angina, palpitations, dyspnea on exertion, peripheral edema.  Respiratory: Negative for dyspnea at rest, dyspnea on exertion, cough, sputum, wheezing.  GI: See history of present illness. Endo: Negative for unusual weight change.  Heme: Negative for bruising or bleeding.   Physical Exam: BP (!) 151/96   Pulse 87   Temp (!) 97.4 F (36.3 C) (Oral)   Ht _0  (1.676 m)   Wt 284 lb 12.8 oz (129.2  kg)   LMP 12/14/2017   BMI 45.97 kg/m  General:   Alert and oriented. Pleasant and cooperative. Well-nourished and well-developed.  Eyes:  Without icterus, sclera clear and conjunctiva pink.  Ears:  Normal auditory acuity. Cardiovascular:  S1, S2 present without murmurs appreciated. Extremities without clubbing or edema. Respiratory:  Clear to auscultation bilaterally. No wheezes, rales, or rhonchi. No distress.  Gastrointestinal:  +BS, soft, and non-distended. Mild soreness with palpation, generalized. No HSM noted. No guarding or rebound. No masses appreciated.  Rectal:  Deferred  Musculoskalatal:  Symmetrical without gross deformities. Skin:  Intact without significant lesions or rashes. Neurologic:  Alert and oriented x4;  grossly normal neurologically. Psych:  Alert and cooperative. Normal mood and affect. Heme/Lymph/Immune: No excessive bruising noted.    03/27/2018 2:03 PM   Disclaimer: This note was dictated with voice recognition software. Similar sounding words can inadvertently be transcribed and may not be corrected upon review.

## 2018-03-28 ENCOUNTER — Other Ambulatory Visit (HOSPITAL_COMMUNITY)
Admission: RE | Admit: 2018-03-28 | Discharge: 2018-03-28 | Disposition: A | Discharge status: Home / Self Care | Payer: No Typology Code available for payment source | Source: Other Acute Inpatient Hospital | Attending: Nurse Practitioner | Admitting: Nurse Practitioner

## 2018-03-28 DIAGNOSIS — E612 Magnesium deficiency: Secondary | ICD-10-CM | POA: Insufficient documentation

## 2018-03-28 LAB — MAGNESIUM: Magnesium: 1.8 mg/dL (ref 1.7–2.4)

## 2018-04-11 ENCOUNTER — Telehealth: Payer: Self-pay

## 2018-04-11 NOTE — Telephone Encounter (Signed)
VM received. Medication isn't working that EG prescribed. Pt would like a return call.

## 2018-04-12 NOTE — Telephone Encounter (Signed)
LMOM, waiting on a return call.  

## 2018-04-18 MED FILL — ?PANTOPRAZOLE SO DR 40MG TA: 40 | 30 days supply | Qty: 30 | Fill #4

## 2018-04-18 MED FILL — ?AMLODIPINE BESYLATE 5 MG T: 5 MG | 30 days supply | Qty: 30 | Fill #2

## 2018-04-18 NOTE — Telephone Encounter (Signed)
Closing note out. Pt hasn't returned call.

## 2018-04-24 ENCOUNTER — Encounter: Payer: Self-pay | Admitting: Internal Medicine

## 2018-04-24 ENCOUNTER — Ambulatory Visit: Payer: No Typology Code available for payment source | Attending: Internal Medicine | Admitting: Internal Medicine

## 2018-04-24 VITALS — BP 138/86 | HR 84 | Temp 97.8°F | Resp 16

## 2018-04-24 DIAGNOSIS — Z880 Allergy status to penicillin: Secondary | ICD-10-CM | POA: Insufficient documentation

## 2018-04-24 DIAGNOSIS — K219 Gastro-esophageal reflux disease without esophagitis: Secondary | ICD-10-CM | POA: Insufficient documentation

## 2018-04-24 DIAGNOSIS — G4700 Insomnia, unspecified: Secondary | ICD-10-CM | POA: Insufficient documentation

## 2018-04-24 DIAGNOSIS — Z811 Family history of alcohol abuse and dependence: Secondary | ICD-10-CM | POA: Insufficient documentation

## 2018-04-24 DIAGNOSIS — R0683 Snoring: Secondary | ICD-10-CM

## 2018-04-24 DIAGNOSIS — Z885 Allergy status to narcotic agent status: Secondary | ICD-10-CM | POA: Insufficient documentation

## 2018-04-24 DIAGNOSIS — R631 Polydipsia: Secondary | ICD-10-CM

## 2018-04-24 DIAGNOSIS — M1711 Unilateral primary osteoarthritis, right knee: Secondary | ICD-10-CM | POA: Insufficient documentation

## 2018-04-24 DIAGNOSIS — Z791 Long term (current) use of non-steroidal anti-inflammatories (NSAID): Secondary | ICD-10-CM | POA: Insufficient documentation

## 2018-04-24 DIAGNOSIS — Z833 Family history of diabetes mellitus: Secondary | ICD-10-CM | POA: Insufficient documentation

## 2018-04-24 DIAGNOSIS — F419 Anxiety disorder, unspecified: Secondary | ICD-10-CM

## 2018-04-24 DIAGNOSIS — Z8249 Family history of ischemic heart disease and other diseases of the circulatory system: Secondary | ICD-10-CM | POA: Insufficient documentation

## 2018-04-24 DIAGNOSIS — Z8261 Family history of arthritis: Secondary | ICD-10-CM | POA: Insufficient documentation

## 2018-04-24 DIAGNOSIS — Z79899 Other long term (current) drug therapy: Secondary | ICD-10-CM | POA: Insufficient documentation

## 2018-04-24 DIAGNOSIS — I10 Essential (primary) hypertension: Secondary | ICD-10-CM | POA: Insufficient documentation

## 2018-04-24 DIAGNOSIS — G5603 Carpal tunnel syndrome, bilateral upper limbs: Secondary | ICD-10-CM

## 2018-04-24 DIAGNOSIS — Z818 Family history of other mental and behavioral disorders: Secondary | ICD-10-CM | POA: Insufficient documentation

## 2018-04-24 DIAGNOSIS — Z801 Family history of malignant neoplasm of trachea, bronchus and lung: Secondary | ICD-10-CM | POA: Insufficient documentation

## 2018-04-24 DIAGNOSIS — Z91011 Allergy to milk products: Secondary | ICD-10-CM | POA: Insufficient documentation

## 2018-04-24 DIAGNOSIS — Z888 Allergy status to other drugs, medicaments and biological substances status: Secondary | ICD-10-CM | POA: Insufficient documentation

## 2018-04-24 DIAGNOSIS — R5383 Other fatigue: Secondary | ICD-10-CM

## 2018-04-24 DIAGNOSIS — Z9049 Acquired absence of other specified parts of digestive tract: Secondary | ICD-10-CM | POA: Insufficient documentation

## 2018-04-24 MED ORDER — MELOXICAM 15 MG PO TABS: 15.0000 mg | ORAL_TABLET | Freq: Every day | ORAL | 5 refills | Status: DC

## 2018-04-24 MED FILL — ?ATORVASTATIN 10MG TABLET: 10 | 30 days supply | Qty: 30 | Fill #7

## 2018-04-24 MED FILL — raNITIdine HCL 150 MG TABS: 150 | 30 days supply | Qty: 30 | Fill #1

## 2018-04-24 MED FILL — ?DULoxetine HCL 20 MG CPEP: 20 | 30 days supply | Qty: 60 | Fill #2

## 2018-04-24 MED FILL — MELOXICAM 15 MG TABLET: 15 | 30 days supply | Qty: 30 | Fill #0

## 2018-04-24 NOTE — Progress Notes (Signed)
Pt states her b/l hands have been cramping and burning

## 2018-04-24 NOTE — Patient Instructions (Signed)
Carpal Tunnel Syndrome Carpal tunnel syndrome is a condition that causes pain in your hand and arm. The carpal tunnel is a narrow area that is on the palm side of your wrist. Repeated wrist motion or certain diseases may cause swelling in the tunnel. This swelling can pinch the main nerve in the wrist (median nerve). Follow these instructions at home: If you have a splint:  Wear it as told by your doctor. Remove it only as told by your doctor.  Loosen the splint if your fingers: ? Become numb and tingle. ? Turn blue and cold.  Keep the splint clean and dry. General instructions  Take over-the-counter and prescription medicines only as told by your doctor.  Rest your wrist from any activity that may be causing your pain. If needed, talk to your employer about changes that can be made in your work, such as getting a wrist pad to use while typing.  If directed, apply ice to the painful area: ? Put ice in a plastic bag. ? Place a towel between your skin and the bag. ? Leave the ice on for 20 minutes, 2-3 times per day.  Keep all follow-up visits as told by your doctor. This is important.  Do any exercises as told by your doctor, physical therapist, or occupational therapist. Contact a doctor if:  You have new symptoms.  Medicine does not help your pain.  Your symptoms get worse. This information is not intended to replace advice given to you by your health care provider. Make sure you discuss any questions you have with your health care provider. Document Released: 09/30/2011 Document Revised: 03/18/2016 Document Reviewed: 02/26/2015 Elsevier Interactive Patient Education  2018 Elsevier Inc.  

## 2018-04-24 NOTE — Progress Notes (Signed)
Patient ID: Amy Perez, female    DOB: [birth year]  MRN: [id number]  CC: Hypertension   Subjective: Amy Perez is a 52 y.o. female who presents for chronic ds management Her concerns today include:  Pt with hx of HTN, GERD, anxiety/depression, bone spurs in heels  Had panic attack this past wkend.  Had to take an Xanax that she had leftover from previous PCP.  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  He did not have any children.  He has left her to plan his funeral. Still taking Cymbalta Last saw Arfeen 2 mths ago but has an upcoming appointment  C/o pain in hands constantly x 5 mths +numbness and burning, intermittent Hurts to write.  Uses OTC pain cream helps a little Swelling at times Complains of overall aching in her bones and wonders whether any over-the-counter multivitamins will help  Complains of being Hungry and thirsty all the times +polyuria Feels tired all the time.  Has been told that she snores quite loudly at nights.  When she wakes up in the morning she does not feel rested.  Patient Active Problem List   Diagnosis Date Noted  . Hypomagnesemia 03/27/2018  . Constipation 12/08/2017  . Taking multiple medications for chronic disease 12/08/2017  . Primary osteoarthritis of right knee 04/21/2017  . Acute bacterial sinusitis 11/07/2016  . Insomnia 07/02/2016  . Carpal tunnel syndrome 06/29/2016  . Gastroesophageal reflux disease without esophagitis 04/26/2016  . Essential hypertension 04/26/2016  . Generalized anxiety disorder 04/26/2016  . OBESITY, MORBID 03/01/2007     Current Outpatient Medications on File Prior to Visit  Medication Sig Dispense Refill  . amLODipine (NORVASC) 5 MG tablet Take 1 tablet (5 mg total) by mouth daily. 90 tablet 3  . atorvastatin (LIPITOR) 10 MG tablet Take 1 tablet (10 mg total) by mouth daily. 90 tablet 3  . DULoxetine 40 MG CPEP Take 40 mg by mouth daily. 90 capsule 0  . esomeprazole (NEXIUM) 40 MG capsule TAKE 1 CAPSULE  BY MOUTH DAILY. 30 capsule 2  . fluticasone (FLONASE) 50 MCG/ACT nasal spray Place 2 sprays into both nostrils daily. (Patient taking differently: Place 2 sprays into both nostrils as needed. ) 16 g 0  . loratadine (CLARITIN) 10 MG tablet Take 1 tablet (10 mg total) by mouth daily. 30 tablet 2  . Multiple Vitamin (MULTIVITAMIN) tablet Take 1 tablet by mouth daily. Reported on 04/28/2016    . pantoprazole (PROTONIX) 40 MG tablet TAKE 1 TABLET (40 MG TOTAL) BY MOUTH DAILY. 30 tablet 6  . polyethylene glycol powder (GLYCOLAX/MIRALAX) powder Take 17 g by mouth daily as needed. (Patient taking differently: Take 17 g by mouth daily as needed (constipation). ) 3350 g 5  . ranitidine (ZANTAC) 150 MG tablet Take 1 tablet (150 mg total) by mouth daily as needed for heartburn. 30 tablet 2   No current facility-administered medications on file prior to visit.     Allergies  Allergen Reactions  . Benadryl [Diphenhydramine Hcl] Itching  . Milk-Related Compounds Diarrhea  . Oxycodone-Acetaminophen Nausea And Vomiting  . Penicillins Nausea Only    Amoxicillin Has patient had a PCN reaction causing immediate rash, facial/tongue/throat swelling, SOB or lightheadedness with hypotension:__________ Has patient had a PCN reaction causing severe rash involving mucus membranes or skin necrosis:________ Has patient had a PCN reaction that required hospitalization:________ Has patient had a PCN reaction occurring within the last 10 years: ________ If all of the above answers are "NO", then may proceed  with Cephalosporin use.     Social History   Socioeconomic History  . Marital status: Single    Spouse name: Not on file  . Number of children: Not on file  . Years of education: Not on file  . Highest education level: Not on file  Occupational History  . Not on file  Social Needs  . Financial resource strain: Not on file  . Food insecurity:    Worry: Not on file    Inability: Not on file  . Transportation  needs:    Medical: Not on file    Non-medical: Not on file  Tobacco Use  . Smoking status: Never Smoker  . Smokeless tobacco: Never Used  Substance and Sexual Activity  . Alcohol use: No  . Drug use: No  . Sexual activity: Not on file  Lifestyle  . Physical activity:    Days per week: Not on file    Minutes per session: Not on file  . Stress: Not on file  Relationships  . Social connections:    Talks on phone: Not on file    Gets together: Not on file    Attends religious service: Not on file    Active member of club or organization: Not on file    Attends meetings of clubs or organizations: Not on file    Relationship status: Not on file  . Intimate partner violence:    Fear of current or ex partner: Not on file    Emotionally abused: Not on file    Physically abused: Not on file    Forced sexual activity: Not on file  Other Topics Concern  . Not on file  Social History Narrative  . Not on file    Family History  Problem Relation Age of Onset  . Hypertension Father 75       Deceased  . Heart attack Father   . Hypertension Mother        Living  . Arthritis Mother   . Diabetes Mother   . Dementia Mother   . Depression Mother   . Arthritis Sister        x2  . Lung cancer Sister        Deceased  . Cancer Other        Paternal & Maternal Aunts  . Heart attack Maternal Grandmother   . Neuropathy Sister   . Heart disease Brother        x1  . Alcoholism Brother        x2  . Colon cancer Neg Hx     Past Surgical History:  Procedure Laterality Date  . CHOLECYSTECTOMY    . COLONOSCOPY WITH PROPOFOL N/A 01/09/2018   Procedure: COLONOSCOPY WITH PROPOFOL;  Surgeon: Corbin Ade, MD;  Location: [facility];  Service: Endoscopy;  Laterality: N/A;  8:45am  . TUBAL LIGATION  08/29/04  . WISDOM TOOTH EXTRACTION      ROS: Review of Systems Negative except as stated above PHYSICAL EXAM: BP 138/86   Pulse 84   Temp 97.8 F (36.6 C) (Oral)   Resp 16   LMP  12/14/2017   SpO2 97%   Wt Readings from Last 3 Encounters:  03/27/18 284 lb 12.8 oz (129.2 kg)  01/02/18 278 lb (126.1 kg)  12/23/17 279 lb 3.2 oz (126.6 kg)    Physical Exam  General appearance - alert, well appearing, and in no distress Mental status - normal mood, behavior, speech, dress, motor activity, and  thought processes Chest - clear to auscultation, no wheezes, rales or rhonchi, symmetric air entry Heart - normal rate, regular rhythm, normal S1, S2, no murmurs, rubs, clicks or gallops Musculoskeletal -hands: No edema or erythema.  Good range of motion at the wrists.  Tinel sign negative. Extremities -no lower extremity edema.  ASSESSMENT AND PLAN: 1. Anxiety Continue Cymbalta. She has a follow-up appointment coming up with her psychiatrist. 2. Bilateral carpal tunnel syndrome Recommend bilateral wrist splints.  We do not have her size.  Patient advised that she can purchase this at any medical supply store.  Increase meloxicam to 15 mg daily. - Vitamin B12 - meloxicam (MOBIC) 15 MG tablet; Take 1 tablet (15 mg total) by mouth daily.  Dispense: 30 tablet; Refill: 5  3. Habitual snoring - PSG Sleep Study; Future  4. Other fatigue - VITAMIN D 25 Hydroxy (Vit-D Deficiency, Fractures) - TSH  5. Polydipsia - Basic metabolic panel - Hemoglobin A1c  Patient was given the opportunity to ask questions.  Patient verbalized understanding of the plan and was able to repeat key elements of the plan.   Orders Placed This Encounter  Procedures  . VITAMIN D 25 Hydroxy (Vit-D Deficiency, Fractures)  . Vitamin B12  . TSH  . Basic metabolic panel  . Hemoglobin A1c  . PSG Sleep Study     Requested Prescriptions   Signed Prescriptions Disp Refills  . meloxicam (MOBIC) 15 MG tablet 30 tablet 5    Sig: Take 1 tablet (15 mg total) by mouth daily.    Return in about 3 months (around 07/25/2018).  Jonah Blueeborah Fanny Agan, MD, FACP

## 2018-04-24 NOTE — Telephone Encounter (Signed)
Pt returned call and said the Amitiza 24 mcg isn't helping her. Pt feels weak when trying to have a bowel movement due to the straining. Pt added an otc fiber supplement daily that allows pt to have a bowel movement twice weekly.

## 2018-04-25 ENCOUNTER — Telehealth: Payer: Self-pay

## 2018-04-25 LAB — BASIC METABOLIC PANEL
BUN / CREAT RATIO: 12 (ref 9–23)
BUN: 11 mg/dL (ref 6–24)
CO2: 25 mmol/L (ref 20–29)
CREATININE: 0.92 mg/dL (ref 0.57–1.00)
Calcium: 9.4 mg/dL (ref 8.7–10.2)
Chloride: 105 mmol/L (ref 96–106)
GFR calc Af Amer: 83 mL/min/{1.73_m2} (ref >59)
GFR, EST NON AFRICAN AMERICAN: 72 mL/min/{1.73_m2} (ref >59)
Glucose: 89 mg/dL (ref 65–99)
Potassium: 3.5 mmol/L (ref 3.5–5.2)
Sodium: 142 mmol/L (ref 134–144)

## 2018-04-25 LAB — TSH: TSH: 1.83 u[IU]/mL (ref 0.450–4.500)

## 2018-04-25 LAB — HEMOGLOBIN A1C
ESTIMATED AVERAGE GLUCOSE: 108 mg/dL
HEMOGLOBIN A1C: 5.4 % (ref 4.8–5.6)

## 2018-04-25 LAB — VITAMIN B12: Vitamin B-12: 1075 pg/mL (ref 232–1245)

## 2018-04-25 LAB — VITAMIN D 25 HYDROXY (VIT D DEFICIENCY, FRACTURES): Vit D, 25-Hydroxy: 27.5 ng/mL — ABNORMAL LOW (ref 30.0–100.0)

## 2018-04-25 NOTE — Telephone Encounter (Signed)
Contacted pt to go over lab results pt is aware and doesn't have any questions or concerns 

## 2018-04-26 ENCOUNTER — Telehealth: Payer: Self-pay

## 2018-04-26 NOTE — Telephone Encounter (Signed)
Contacted pt to go over lab results pt is aware of results and doesn't have any questions or concerns 

## 2018-05-04 ENCOUNTER — Ambulatory Visit (HOSPITAL_COMMUNITY): Payer: Self-pay | Admitting: Psychiatry

## 2018-05-15 MED FILL — ?PANTOPRAZOLE SO DR 40MG TA: 40 | 30 days supply | Qty: 30 | Fill #5

## 2018-05-17 ENCOUNTER — Ambulatory Visit: Payer: Self-pay | Attending: Family Medicine

## 2018-05-24 ENCOUNTER — Ambulatory Visit: Payer: Self-pay

## 2018-05-25 ENCOUNTER — Ambulatory Visit (HOSPITAL_BASED_OUTPATIENT_CLINIC_OR_DEPARTMENT_OTHER): Payer: Self-pay | Attending: Internal Medicine | Admitting: Internal Medicine

## 2018-05-25 VITALS — Ht 66.0 in | Wt 275.0 lb

## 2018-05-25 DIAGNOSIS — R0683 Snoring: Secondary | ICD-10-CM | POA: Insufficient documentation

## 2018-05-25 DIAGNOSIS — R0902 Hypoxemia: Secondary | ICD-10-CM | POA: Insufficient documentation

## 2018-05-25 DIAGNOSIS — G4733 Obstructive sleep apnea (adult) (pediatric): Secondary | ICD-10-CM | POA: Insufficient documentation

## 2018-05-29 MED FILL — ATORVASTATIN 10 MG TABLET: 10 | 30 days supply | Qty: 30 | Fill #8

## 2018-05-29 MED FILL — MELOXICAM 15 MG TABLET: 15 | 30 days supply | Qty: 30 | Fill #1

## 2018-05-29 MED FILL — AMLODIPINE BESYLATE 5 MG TA: 5 | 30 days supply | Qty: 30 | Fill #3

## 2018-05-31 ENCOUNTER — Other Ambulatory Visit: Payer: Self-pay | Admitting: Internal Medicine

## 2018-05-31 DIAGNOSIS — R0683 Snoring: Secondary | ICD-10-CM

## 2018-05-31 DIAGNOSIS — G4733 Obstructive sleep apnea (adult) (pediatric): Secondary | ICD-10-CM

## 2018-05-31 NOTE — Procedures (Signed)
   Patient Name: Amy Perez, Arlet Study Date: 05/25/2018 Gender: Female D.O.B: Aug 20, 1966 Age (years): [age] Referring Provider: Jonah Blueeborah Johnson MD Height (inches): 66 Interpreting Physician: Jetty Duhamellinton Tamaj Jurgens MD, ABSM Weight (lbs): 275 RPSGT: Armen PickupFord, Evelyn BMI: 44 MRN: [id number] Neck Size: 17.00  CLINICAL INFORMATION Sleep Study Type: NPSG Indication for sleep study: Snoring  Epworth Sleepiness Score: 15  SLEEP STUDY TECHNIQUE As per the AASM Manual for the Scoring of Sleep and Associated Events v2.3 (April 2016) with a hypopnea requiring 4% desaturations.  The channels recorded and monitored were frontal, central and occipital EEG, electrooculogram (EOG), submentalis EMG (chin), nasal and oral airflow, thoracic and abdominal wall motion, anterior tibialis EMG, snore microphone, electrocardiogram, and pulse oximetry.  MEDICATIONS Medications self-administered by patient taken the night of the study : none reported  SLEEP ARCHITECTURE The study was initiated at 10:39:57 PM and ended at 4:32:24 AM.  Sleep onset time was 46.4 minutes and the sleep efficiency was 69.9%%. The total sleep time was 246.5 minutes.  Stage REM latency was 249.5 minutes.  The patient spent 11.6%% of the night in stage N1 sleep, 80.1%% in stage N2 sleep, 0.0%% in stage N3 and 8.3% in REM.  Alpha intrusion was absent.  Supine sleep was 33.87%.  RESPIRATORY PARAMETERS The overall apnea/hypopnea index (AHI) was 20.9 per hour. There were 0 total apneas, including 0 obstructive, 0 central and 0 mixed apneas. There were 86 hypopneas and 63 RERAs.  The AHI during Stage REM sleep was 55.6 per hour.  AHI while supine was 7.9 per hour.  The mean oxygen saturation was 94.4%. The minimum SpO2 during sleep was 83.0%.  moderate snoring was noted during this study.  CARDIAC DATA The 2 lead EKG demonstrated sinus rhythm. The mean heart rate was 80.5 beats per minute. Other EKG findings include: None.  LEG  MOVEMENT DATA The total PLMS were 0 with a resulting PLMS index of 0.0. Associated arousal with leg movement index was 0.0 .  IMPRESSIONS - Moderate obstructive sleep apnea occurred during this study (AHI = 20.9/h). - No significant central sleep apnea occurred during this study (CAI = 0.0/h). - Mild oxygen desaturation was noted during this study (Min O2 = 83.0%). - The patient snored with moderate snoring volume. - No cardiac abnormalities were noted during this study. - Clinically significant periodic limb movements did not occur during sleep. No significant associated arousals.  DIAGNOSIS - Obstructive Sleep Apnea (327.23 [G47.33 ICD-10]) - Nocturnal Hypoxemia (327.26 [G47.36 ICD-10])  RECOMMENDATIONS - Suggest CPAP titration sleep study or DME autopap. Other options would be based on clinical judgment. - Be careful with alcohol, sedatives and other CNS depressants that may worsen sleep apnea and disrupt normal sleep architecture. - Sleep hygiene should be reviewed to assess factors that may improve sleep quality. - Weight management and regular exercise should be initiated or continued if appropriate.  [Electronically signed] 05/31/2018 01:51 PM  Jetty Duhamellinton Jaleeah Slight MD, ABSM Diplomate, American Board of Sleep Medicine   NPI: [id number]                         Jetty Duhamellinton Avory Mimbs Diplomate, American Board of Sleep Medicine  ELECTRONICALLY SIGNED ON:  05/31/2018, 1:49 PM [facility] PH: [phone]   FX: [phone] ACCREDITED BY THE AMERICAN ACADEMY OF SLEEP MEDICINE

## 2018-06-01 ENCOUNTER — Other Ambulatory Visit (HOSPITAL_COMMUNITY): Payer: Self-pay

## 2018-06-01 ENCOUNTER — Telehealth: Payer: Self-pay

## 2018-06-01 DIAGNOSIS — F329 Major depressive disorder, single episode, unspecified: Secondary | ICD-10-CM

## 2018-06-01 DIAGNOSIS — F419 Anxiety disorder, unspecified: Principal | ICD-10-CM

## 2018-06-01 MED ORDER — DULOXETINE HCL 20 MG PO CPEP: 40.0000 mg | ORAL_CAPSULE | Freq: Every day | ORAL | 1 refills | Status: DC

## 2018-06-01 MED FILL — DULoxetine HCL 20 MG CPEP: 20 | 30 days supply | Qty: 60 | Fill #0

## 2018-06-01 NOTE — Telephone Encounter (Signed)
Contacted pt to go over sleep study results pt is aware and doesn't have any questions or concerns  

## 2018-06-02 ENCOUNTER — Telehealth: Payer: Self-pay

## 2018-06-02 ENCOUNTER — Ambulatory Visit: Payer: Self-pay | Attending: Family Medicine

## 2018-06-02 NOTE — Telephone Encounter (Signed)
Pt came by the office yesterday 06/01/18 asking if pcp would be able to write a letter because [facility]

## 2018-06-05 NOTE — Telephone Encounter (Signed)
Contacted pt to make aware that letter is ready for pick up left a detailed vm informing pt of this information

## 2018-06-06 ENCOUNTER — Telehealth: Payer: Self-pay | Admitting: Internal Medicine

## 2018-06-06 ENCOUNTER — Encounter (HOSPITAL_COMMUNITY): Payer: Self-pay

## 2018-06-06 ENCOUNTER — Other Ambulatory Visit: Payer: Self-pay

## 2018-06-06 ENCOUNTER — Emergency Department (HOSPITAL_COMMUNITY)
Admission: EM | Admit: 2018-06-06 | Discharge: 2018-06-06 | Disposition: A | Discharge status: Home / Self Care | Payer: Self-pay | Attending: Emergency Medicine | Admitting: Emergency Medicine

## 2018-06-06 DIAGNOSIS — D573 Sickle-cell trait: Secondary | ICD-10-CM | POA: Insufficient documentation

## 2018-06-06 DIAGNOSIS — Z79899 Other long term (current) drug therapy: Secondary | ICD-10-CM | POA: Insufficient documentation

## 2018-06-06 DIAGNOSIS — G5603 Carpal tunnel syndrome, bilateral upper limbs: Secondary | ICD-10-CM | POA: Insufficient documentation

## 2018-06-06 DIAGNOSIS — I10 Essential (primary) hypertension: Secondary | ICD-10-CM | POA: Insufficient documentation

## 2018-06-06 MED ORDER — DICLOFENAC SODIUM 1 % TD GEL
2.0000 g | Freq: Four times a day (QID) | TRANSDERMAL | 0 refills | Status: DC
Start: 2018-06-06 — End: 2019-12-03

## 2018-06-06 NOTE — ED Triage Notes (Signed)
Pt c./o bilateral hand and wrist pain. Hx of tendonitis and carpel tunnel. Reports nothing makes it better. Has tried splints.

## 2018-06-06 NOTE — Telephone Encounter (Signed)
Pt called to request a nurse call back, she has a couple questions about her sleep study results, please follow up

## 2018-06-06 NOTE — ED Provider Notes (Signed)
[facility]-EMERGENCY DEPT Provider Note   CSN: [id number] Arrival date & time: 06/06/18  1350     History   Chief Complaint Chief Complaint  Patient presents with  . Hand Pain  . Hypertension    HPI Amy Perez is a 52 y.o. female senting for evaluation of bilateral hand pain.  Patient states for the past 7 months, she has been having bilateral hand pain.  This is been evaluated multiple times by her primary care doctor.  She states that she has been on ibuprofen, prednisone, been using wrist splints, and is currently on Mobic.  None of this has helped.  She is here today because she is tired of hurting.  No new injury or worsening of symptoms.  She reports intermittent pain that extends from the wrist to the elbow, but this is rare.  Pain is mostly from the wrist to the hands.  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  She denies fall, trauma, or injury.  She wants x-rays.  Pain is constant, nothing makes it better or worse.  Patient states she has not taken her blood pressure medicine today.  She denies chest pain, shortness of breath, headache, vision changes, slurred speech, new weakness.  Additional history obtained from chart review, patient last followed up with her primary care doctor beginning of July for carpal tunnel.  Was recommended that she trial the wrist splints and Mobic at that time.  HPI  Past Medical History:  Diagnosis Date  . AC (acromioclavicular) joint bone spurs   . Acid reflux   . Anxiety   . Blood dyscrasia   . CTS (carpal tunnel syndrome)    Bilateral  . Depression   . Gastric ulcer   . GERD (gastroesophageal reflux disease)   . Headache   . Heel spur   . History of chicken pox   . Hypertension   . Panic attacks   . Plantar fasciitis   . Sickle cell trait (HCC)   . Tendonitis    Feet    Patient Active Problem List   Diagnosis Date Noted  . Hypomagnesemia 03/27/2018  . Constipation 12/08/2017   . Taking multiple medications for chronic disease 12/08/2017  . Primary osteoarthritis of right knee 04/21/2017  . Acute bacterial sinusitis 11/07/2016  . Insomnia 07/02/2016  . Carpal tunnel syndrome 06/29/2016  . Gastroesophageal reflux disease without esophagitis 04/26/2016  . Essential hypertension 04/26/2016  . Generalized anxiety disorder 04/26/2016  . OBESITY, MORBID 03/01/2007    Past Surgical History:  Procedure Laterality Date  . CHOLECYSTECTOMY    . COLONOSCOPY WITH PROPOFOL N/A 01/09/2018   Procedure: COLONOSCOPY WITH PROPOFOL;  Surgeon: Corbin Ade, MD;  Location: [facility];  Service: Endoscopy;  Laterality: N/A;  8:45am  . TUBAL LIGATION  08/29/04  . WISDOM TOOTH EXTRACTION       OB History   None      Home Medications    Prior to Admission medications   Medication Sig Start Date End Date Taking? Authorizing Provider  amLODipine (NORVASC) 5 MG tablet Take 1 tablet (5 mg total) by mouth daily. 02/21/18   Marcine Matar, MD  atorvastatin (LIPITOR) 10 MG tablet Take 1 tablet (10 mg total) by mouth daily. 09/25/17   Marcine Matar, MD  diclofenac sodium (VOLTAREN) 1 % GEL Apply 2 g topically 4 (four) times daily. 06/06/18   Loran Auguste, PA-C  DULoxetine (CYMBALTA) 20 MG capsule Take  2 capsules (40 mg total) by mouth daily. 06/01/18 06/01/19  Arfeen, Phillips GroutSyed T, MD  DULoxetine 40 MG CPEP Take 40 mg by mouth daily. 03/02/18   Arfeen, Phillips GroutSyed T, MD  esomeprazole (NEXIUM) 40 MG capsule TAKE 1 CAPSULE BY MOUTH DAILY. 10/28/17   Marcine MatarJohnson, Deborah B, MD  fluticasone (FLONASE) 50 MCG/ACT nasal spray Place 2 sprays into both nostrils daily. Patient taking differently: Place 2 sprays into both nostrils as needed.  11/17/16   Waldon MerlMartin, William C, PA-C  loratadine (CLARITIN) 10 MG tablet Take 1 tablet (10 mg total) by mouth daily. 11/08/17   Marcine MatarJohnson, Deborah B, MD  meloxicam (MOBIC) 15 MG tablet Take 1 tablet (15 mg total) by mouth daily. 04/24/18   Marcine MatarJohnson, Deborah B, MD  Multiple  Vitamin (MULTIVITAMIN) tablet Take 1 tablet by mouth daily. Reported on 04/28/2016    [provider]  pantoprazole (PROTONIX) 40 MG tablet TAKE 1 TABLET (40 MG TOTAL) BY MOUTH DAILY. 10/29/17   Marcine MatarJohnson, Deborah B, MD  polyethylene glycol powder (GLYCOLAX/MIRALAX) powder Take 17 g by mouth daily as needed. Patient taking differently: Take 17 g by mouth daily as needed (constipation).  09/23/17   Marcine MatarJohnson, Deborah B, MD  ranitidine (ZANTAC) 150 MG tablet Take 1 tablet (150 mg total) by mouth daily as needed for heartburn. 03/27/18   Anice PaganiniGill, Eric A, NP    Family History Family History  Problem Relation Age of Onset  . Hypertension Father 6261       Deceased  . Heart attack Father   . Hypertension Mother        Living  . Arthritis Mother   . Diabetes Mother   . Dementia Mother   . Depression Mother   . Arthritis Sister        x2  . Lung cancer Sister        Deceased  . Cancer Other        Paternal & Maternal Aunts  . Heart attack Maternal Grandmother   . Neuropathy Sister   . Heart disease Brother        x1  . Alcoholism Brother        x2  . Colon cancer Neg Hx     Social History Social History   Tobacco Use  . Smoking status: Never Smoker  . Smokeless tobacco: Never Used  Substance Use Topics  . Alcohol use: No  . Drug use: No     Allergies   Benadryl [diphenhydramine hcl]; Milk-related compounds; Oxycodone-acetaminophen; and Penicillins   Review of Systems Review of Systems  Musculoskeletal:       Bilateral hand pain  All other systems reviewed and are negative.    Physical Exam Updated Vital Signs BP (!) 148/108 (BP Location: Right Arm)   Pulse 96   Temp 98.4 F (36.9 C) (Oral)   Resp 16   Ht 5\' 6"  (1.676 m)   Wt 122.5 kg   LMP 12/14/2017   SpO2 98%   BMI 43.58 kg/m   Physical Exam  Constitutional: She is oriented to person, place, and time. She appears well-developed and well-nourished. No distress.  Appears in no distress  HENT:  Head:  Normocephalic and atraumatic.  Eyes: Pupils are equal, round, and reactive to light. Conjunctivae and EOM are normal.  Neck: Normal range of motion. Neck supple.  Cardiovascular: Normal rate, regular rhythm and intact distal pulses.  Pulmonary/Chest: Effort normal and breath sounds normal. No respiratory distress. She has no wheezes.  Abdominal: Soft. She  exhibits no distension and no mass. There is no tenderness. There is no guarding.  Musculoskeletal: Normal range of motion.  Radial pulses intact bilaterally.  No erythema or warmth of either hand.  Grip strength intact bilaterally.  Positive Phalen's.  Good cap refill of all fingers.  Full active range of motion of the wrist and fingers without difficulty.  Neurological: She is alert and oriented to person, place, and time.  Skin: Skin is warm and dry.  Psychiatric: She has a normal mood and affect.  Nursing note and vitals reviewed.    ED Treatments / Results  Labs (all labs ordered are listed, but only abnormal results are displayed) Labs Reviewed - No data to display  EKG None  Radiology No results found.  Procedures Procedures (including critical care time)  Medications Ordered in ED Medications - No data to display   Initial Impression / Assessment and Plan / ED Course  I have reviewed the triage vital signs and the nursing notes.  Pertinent labs & imaging results that were available during my care of the patient were reviewed by me and considered in my medical decision making (see chart for details).     Pt presenting for evaluation of bilateral hand pain.  This has been going on for several months, no change recently.  She is currently being worked up for bilateral carpal tunnel by her primary care doctor.  Patient stating that she wants x-rays, however I explained to her that without a fall or injury, doubt bony abnormality.  Symptoms been going on for 7 months, doubt new fracture or dislocation.  I do not believe  x-rays would be beneficial.  Discussed with patient that she needs to follow-up with her primary care doctor for further management of her carpal tunnel syndrome.  Will trial Voltaren gel to help with pain and swelling.  Patient requesting new wrist splints.  Additionally, patient is mildly hypertensive in the ER.  She has not taken her blood pressure medication today.  No sign of endorgan damage.  Discussed with patient that she needs to take her blood pressure medication as prescribed. At this time, patient appears safe for discharge.  Return precautions given.  Patient states she understands and agrees to plan.   Final Clinical Impressions(s) / ED Diagnoses   Final diagnoses:  Bilateral carpal tunnel syndrome  Hypertension, unspecified type    ED Discharge Orders         Ordered    diclofenac sodium (VOLTAREN) 1 % GEL  4 times daily     06/06/18 1511           Farris Geiman, PA-C 06/06/18 1744    Charlynne PanderYao, David Hsienta, MD 06/09/18 [id number]

## 2018-06-06 NOTE — ED Notes (Signed)
Pt states she has not taken BP meds today. RN made aware of abnormal vitals

## 2018-06-06 NOTE — Discharge Instructions (Signed)
Wear braces as frequently as possible, and especially at night. Continue taking Mobic daily as prescribed. Use Voltaren gel as needed for further pain.  You may also take Tylenol. It is important that you follow-up with your primary care doctor for further evaluation and management of your symptoms. Return to the emergency room if you develop high fevers, inability to move your hands, or any new or concerning symptoms.

## 2018-06-08 NOTE — Telephone Encounter (Signed)
Returned pt call and gave all information needed

## 2018-06-08 NOTE — Telephone Encounter (Signed)
Please call patient about sleep study results.

## 2018-06-19 MED FILL — ?PANTOPRAZOLE SO DR 40MG TA: 40 | 30 days supply | Qty: 30 | Fill #6

## 2018-06-20 ENCOUNTER — Ambulatory Visit (HOSPITAL_COMMUNITY): Payer: Self-pay | Admitting: Psychiatry

## 2018-06-20 ENCOUNTER — Ambulatory Visit (HOSPITAL_BASED_OUTPATIENT_CLINIC_OR_DEPARTMENT_OTHER): Payer: Self-pay | Attending: Internal Medicine | Admitting: Internal Medicine

## 2018-06-20 VITALS — Ht 66.0 in | Wt 275.0 lb

## 2018-06-20 DIAGNOSIS — G4733 Obstructive sleep apnea (adult) (pediatric): Secondary | ICD-10-CM | POA: Insufficient documentation

## 2018-06-28 ENCOUNTER — Ambulatory Visit: Payer: Self-pay | Admitting: Nurse Practitioner

## 2018-06-29 ENCOUNTER — Telehealth: Payer: Self-pay | Admitting: Internal Medicine

## 2018-06-29 MED FILL — MELOXICAM 15 MG TABLET: 15 | 30 days supply | Qty: 30 | Fill #2

## 2018-06-29 MED FILL — ?ATORVASTATIN 10 MG TABLET: 10 | 30 days supply | Qty: 30 | Fill #9

## 2018-06-29 MED FILL — AMLODIPINE BESYLATE 5 MG TA: 5 | 30 days supply | Qty: 30 | Fill #4

## 2018-06-29 MED FILL — ?DULoxetine HCL 20 MG CPEP: 20 | 30 days supply | Qty: 60 | Fill #1

## 2018-06-29 NOTE — Telephone Encounter (Signed)
[phone] PLEASE CALL PATIENT, NEEDS TO KNOW IF SHE IS SUPPOSED TO CONTINUE A MEDICATION AND ALSO HAS QUESTIONS

## 2018-06-29 NOTE — Telephone Encounter (Signed)
I called pt and she first asked about taking magnesium and if she needed to continue taking it. She had follow up for 06/28/2018 but had to cancel and then was rescheduled for 10/11/2018. She wants to have her magnesium checked. She said she was never notified of results, but I told her Helmut Muster gave her results on 03/29/2018. Then she asked about something for constipation. I asked her what she was taking and she said some OTC stuff. I asked what kind of stuff and seh said x-lax.  I asked again what in addition to x-lax and she said that powder stuff that you mix up. I asked her if it was Miralax and she yes, she takes it about 4 times a week. I made her aware that she can take that daily if needed. She is requesting something different. Forwarding to [employer] to advise.

## 2018-06-30 NOTE — Telephone Encounter (Signed)
We have previously tried Amitiza 8 mcg (didn't help) and Linzess (caused 'side effects").  Last recommended Amitiza 24 mcg...did she ever try this? If yes, then have her pick up a week of Trulance to try. If not, have her pick up 1-2 weeks of Amitiza 24 mcg to try.  Call with progress report.

## 2018-07-01 DIAGNOSIS — G4733 Obstructive sleep apnea (adult) (pediatric): Secondary | ICD-10-CM

## 2018-07-01 NOTE — Procedures (Signed)
Patient Name: Amy Perez, Amy Perez Date: 06/20/2018 Gender: Female D.O.B: 11/13/1965 Age (years): 35 Referring Provider: Jonah Blue MD Height (inches): 66 Interpreting Physician: Jetty Duhamel MD, ABSM Weight (lbs): 275 RPSGT: Ulyess Mort BMI: 44 MRN: [id number] Neck Size: 16.50  CLINICAL INFORMATION The patient is referred for a CPAP titration to treat sleep apnea. Date of NPSG, Split Night or HST:   NPSG 05/25/18   AHI 20.9/ hr, desaturation to 83%, bodyweight 278 lbs  SLEEP STUDY TECHNIQUE As per the AASM Manual for the Scoring of Sleep and Associated Events v2.3 (April 2016) with a hypopnea requiring 4% desaturations.  The channels recorded and monitored were frontal, central and occipital EEG, electrooculogram (EOG), submentalis EMG (chin), nasal and oral airflow, thoracic and abdominal wall motion, anterior tibialis EMG, snore microphone, electrocardiogram, and pulse oximetry. Continuous positive airway pressure (CPAP) was initiated at the beginning of the study and titrated to treat sleep-disordered breathing.  MEDICATIONS Medications self-administered by patient taken the night of the study : none reported  TECHNICIAN COMMENTS Comments added by technician: PATIENT WAS ORDERED AS A CPAP TITRATION. Comments added by scorer: N/A RESPIRATORY PARAMETERS Optimal PAP Pressure (cm): 18 AHI at Optimal Pressure (/hr): 13.4 Overall Minimal O2 (%): 87.0 Supine % at Optimal Pressure (%): 100 Minimal O2 at Optimal Pressure (%): 92.0   SLEEP ARCHITECTURE The study was initiated at 11:15:38 PM and ended at 5:41:49 AM.  Sleep onset time was 54.4 minutes and the sleep efficiency was 74.6%%. The total sleep time was 288 minutes.  The patient spent 15.5%% of the night in stage N1 sleep, 66.8%% in stage N2 sleep, 0.0%% in stage N3 and 17.7% in REM.Stage REM latency was 155.5 minutes  Wake after sleep onset was 43.8. Alpha intrusion was absent. Supine sleep was 93.92%.  CARDIAC  DATA The 2 lead EKG demonstrated sinus rhythm. The mean heart rate was 78.8 beats per minute. Other EKG findings include: None.  LEG MOVEMENT DATA The total Periodic Limb Movements of Sleep (PLMS) were 0. The PLMS index was 0.0. A PLMS index of <15 is considered normal in adults.  IMPRESSIONS - The optimal PAP pressure was 18 cm of water. - Central sleep apnea was not noted during this titration (CAI = 0.0/h). - Mild oxygen desaturations were observed during this titration (min O2 = 87.0%). - The patient snored with moderate snoring volume during this titration study. - No cardiac abnormalities were observed during this study. - Clinically significant periodic limb movements were not noted during this study. Arousals associated with PLMs were rare.  DIAGNOSIS - Obstructive Sleep Apnea (327.23 [G47.33 ICD-10])  RECOMMENDATIONS - Trial of CPAP therapy on 18 cm H2O or DME autopap 10-20. Patient used a Small size [employer]kel Full Face Mask Simplus mask and heated humidification. - Be careful with alcohol, sedatives and other CNS depressants that may worsen sleep apnea and disrupt normal sleep architecture. - Sleep hygiene should be reviewed to assess factors that may improve sleep quality. - Weight management and regular exercise should be initiated or continued.  [Electronically signed] 07/01/2018 10:38 AM  Jetty Duhamel MD, ABSM Diplomate, American Board of Sleep Medicine   NPI: [id number]                           Jetty Duhamel Diplomate, American Board of Sleep Medicine  ELECTRONICALLY SIGNED ON:  07/01/2018, 10:34 AM [facility] PH: [phone]   FX: [phone] ACCREDITED BY THE AMERICAN ACADEMY  OF SLEEP MEDICINE

## 2018-07-03 ENCOUNTER — Telehealth: Payer: Self-pay

## 2018-07-03 ENCOUNTER — Telehealth: Payer: Self-pay | Admitting: Gastroenterology

## 2018-07-03 NOTE — Telephone Encounter (Signed)
Spoke with pt. She is aware of the recommendations.  1) pt states that she would like to have her lab work done to check her magnesium. Pt said since she was told that her levels were high and she's had to take Magnesium supplements. She's very concerned with lab work and said she mentioned that when she called.  2) pt is concerned the medications Amitiza, Trulance could raise her BP? I told pt she could also check with her PCP as well to see what medications can cause problems with with BP.

## 2018-07-03 NOTE — Telephone Encounter (Signed)
Pt was calling DS with questions about her labs and medications. Please call her at [phone]

## 2018-07-03 NOTE — Telephone Encounter (Signed)
Call placed to the patient and explained the results of her CPAP titration. She said that she is not working and does not have money to pay for a new CPAP.  Explained to her that the [employer] ( [employer]AA) has a CPAP assistance program.  They provide refurbished machines for $100 donation. There is no guarantee that comes with the machine.  The [facility] will pay the $100 donation for a machine once/patient.  The machine is delivered to [facility] and a teaching session with a Respiratory therapist will be scheduled to review he use/care of the machine prior to taking it home. The patient said that she understood . Explained to her that she will need to sign a waiver prior to [facility] submitting the application. The waiver will be at the [facility] front desk.  She can sign and then leave for this CM  She said that she would come by the clinic this afternoon or tomorrow morning

## 2018-07-04 NOTE — Telephone Encounter (Signed)
See previous note. Helmut Muster spoke with pt at 12:03 yesterday, 07/03/2018.

## 2018-07-05 ENCOUNTER — Telehealth: Payer: Self-pay | Admitting: Internal Medicine

## 2018-07-05 ENCOUNTER — Telehealth: Payer: Self-pay

## 2018-07-05 NOTE — Telephone Encounter (Signed)
Her magnesium was low which is why she was started on Magnesium supplements. The last Mg level we have was normal at 1.8. I will put in an order to recheck.  I've checked the known side effects for both Amitiza and Trulance and neither causes hypertension. She should try one (last recommended Amitiza 24 mcg, can get samples).

## 2018-07-05 NOTE — Telephone Encounter (Signed)
Completed application for CPAP machine faxed to [employer] - fax # [phone].

## 2018-07-05 NOTE — Telephone Encounter (Signed)
Returned pts call. Pt is aware that a message was sent to the provider. Pt states well, I'm having a bad migraine and haven't have one in months. Pt was asked to call her PCP about the headache. Pt said I read that if your magnesium levels are low, it can cause headaches.

## 2018-07-05 NOTE — Telephone Encounter (Signed)
Returned pt call to see what kind of symptoms she is having. Pt didn't answer lvm asking her to give me a call at earliest convenience at my desk line and if I don't pick up she can leave a detailed vm informing me of the symptoms she is having so I can let Dr. Laural Benes know

## 2018-07-05 NOTE — Telephone Encounter (Signed)
Patient called requesting a nurse to return her call, she would like to have an order for magnesium blood work because she hasn't had it done in a while and she thinks the symptoms that's she's having it because of that. Please follow up.

## 2018-07-05 NOTE — Telephone Encounter (Signed)
See previous result note

## 2018-07-05 NOTE — Telephone Encounter (Signed)
Pt has called again this morning asking to speak to someone about her Magnesium, having her blood work done and figuring out "what's wrong with my body". Looks like DS and AM have both spoke with patient last week and was given recommendations. Please call her at [phone]

## 2018-07-05 NOTE — Addendum Note (Signed)
Addended by: Delane Ginger, ERIC A on: 07/05/2018 04:53 PM   Modules accepted: Orders

## 2018-07-06 ENCOUNTER — Telehealth: Payer: Self-pay | Admitting: Internal Medicine

## 2018-07-06 NOTE — Telephone Encounter (Signed)
Pt returned call yesterday and left a detailed vm on phone stating when she was taking the magnesium she didn't have a monthly, but once she stopped taking them she has been having a monthly and it has been heavy and she has been having bad headaches. Pt states she was on the magnesium for 6-7 months. Pt states she is wanting to see if her level is high or low.   Contacted pt and informed her that the GI put in an order for magnesium yesterday pt states [facility] will go [facility] to [facility] to get it drawn.

## 2018-07-06 NOTE — Telephone Encounter (Signed)
See separate note, I have talked to pt.  

## 2018-07-06 NOTE — Telephone Encounter (Signed)
PT is aware. She will pick up lab order when she comes to get the samples. She declined the Amitiza 24 mcg and wants to try the Trulance.  Trulance #7 tablets at front for pick up, to take one tablet daily, with or without food.  FYI to Prospect[place], since pt wanted the Trulance that was previously mentioned.

## 2018-07-06 NOTE — Telephone Encounter (Signed)
LMOM to call.

## 2018-07-06 NOTE — Telephone Encounter (Signed)
Pt was returning a call from today. Please call her back at [phone].

## 2018-07-07 ENCOUNTER — Other Ambulatory Visit (HOSPITAL_COMMUNITY)
Admission: RE | Admit: 2018-07-07 | Discharge: 2018-07-07 | Disposition: A | Discharge status: Home / Self Care | Payer: Self-pay | Source: Ambulatory Visit | Attending: Nurse Practitioner | Admitting: Nurse Practitioner

## 2018-07-07 DIAGNOSIS — E612 Magnesium deficiency: Secondary | ICD-10-CM | POA: Insufficient documentation

## 2018-07-07 LAB — MAGNESIUM: Magnesium: 1.9 mg/dL (ref 1.7–2.4)

## 2018-07-07 NOTE — Telephone Encounter (Signed)
Noted, no further recommendations at this time. 

## 2018-07-07 NOTE — Telephone Encounter (Signed)
Pt called around 10:20 07/07/18 asking for her lab order to be faxed to [facility]. Orders were faxed [id number]. Fax confirmed by staff at [facility].

## 2018-07-11 ENCOUNTER — Telehealth: Payer: Self-pay | Admitting: Internal Medicine

## 2018-07-11 NOTE — Telephone Encounter (Signed)
For some reason her labs did not come to my in-basket. However, I looked them up. Her Magnesium level is now perfectly normal. She should follow-up with her PCP for further testing and supplement of her Magnesium level. She can call us if she has any questions.

## 2018-07-11 NOTE — Telephone Encounter (Signed)
Pt called wanting her lab results. She had them done on Friday. I told her it takes 7-10 business days before we get them reviewed and signed off by the doctor and the nurse would call her back [phone]

## 2018-07-11 NOTE — Telephone Encounter (Signed)
Noted, pt notified of results.  

## 2018-07-11 NOTE — Telephone Encounter (Signed)
Pt is inquiring about her lab results.  

## 2018-07-12 ENCOUNTER — Telehealth: Payer: Self-pay

## 2018-07-12 NOTE — Telephone Encounter (Signed)
Call placed to [employer] # [phone] to make payment for CPAP.  Message left for CPAP Assistance program  requesting a call back to # [phone].

## 2018-07-18 ENCOUNTER — Telehealth: Payer: Self-pay | Admitting: Internal Medicine

## 2018-07-18 NOTE — Telephone Encounter (Signed)
Call returned to the patient.  She was checking on the status of her CPAP machine. Explained to her that it can take weeks to receive the machines after the order is placed. This CM also explained that when the machine arrives, she will be scheduled for an appointment to meet with the respiratory therapist from the hospital to review use/care of the machine.

## 2018-07-18 NOTE — Telephone Encounter (Signed)
Patient called in to get information regarding her c-pap machine. Please follow up with patient.

## 2018-07-21 ENCOUNTER — Other Ambulatory Visit: Payer: Self-pay | Admitting: Internal Medicine

## 2018-07-21 DIAGNOSIS — K219 Gastro-esophageal reflux disease without esophagitis: Secondary | ICD-10-CM

## 2018-07-24 ENCOUNTER — Telehealth: Payer: Self-pay

## 2018-07-24 ENCOUNTER — Other Ambulatory Visit (HOSPITAL_COMMUNITY): Payer: Self-pay | Admitting: Psychiatry

## 2018-07-24 MED FILL — MELOXICAM 15 MG TABLET: 15 | 30 days supply | Qty: 30 | Fill #3

## 2018-07-24 MED FILL — ?AMLODIPINE BESYLATE 5 MG T: 5 MG | 30 days supply | Qty: 30 | Fill #5

## 2018-07-24 MED FILL — ?PANTOPRAZOLE SO DR 40MG TA: 40 | 30 days supply | Qty: 30 | Fill #0

## 2018-07-24 MED FILL — ?ATORVASTATIN 10 MG TABLET: 10 | 30 days supply | Qty: 30 | Fill #10

## 2018-07-24 NOTE — Telephone Encounter (Signed)
This CM spoke to [place] with [employer] - CPAP assistance program and paid the $100 donation for the patient's CPAP machine. She expects it to be shipped in the next 1-2 weeks.

## 2018-07-25 ENCOUNTER — Ambulatory Visit: Payer: Self-pay | Admitting: Internal Medicine

## 2018-07-27 ENCOUNTER — Encounter: Payer: Self-pay | Admitting: Internal Medicine

## 2018-07-27 ENCOUNTER — Ambulatory Visit: Payer: Self-pay | Attending: Internal Medicine | Admitting: Internal Medicine

## 2018-07-27 ENCOUNTER — Other Ambulatory Visit (HOSPITAL_COMMUNITY): Payer: Self-pay

## 2018-07-27 VITALS — BP 138/85 | HR 101 | Temp 98.1°F | Ht 66.0 in | Wt 289.0 lb

## 2018-07-27 DIAGNOSIS — M7731 Calcaneal spur, right foot: Secondary | ICD-10-CM | POA: Insufficient documentation

## 2018-07-27 DIAGNOSIS — F329 Major depressive disorder, single episode, unspecified: Secondary | ICD-10-CM | POA: Insufficient documentation

## 2018-07-27 DIAGNOSIS — Z79899 Other long term (current) drug therapy: Secondary | ICD-10-CM | POA: Insufficient documentation

## 2018-07-27 DIAGNOSIS — G4700 Insomnia, unspecified: Secondary | ICD-10-CM | POA: Insufficient documentation

## 2018-07-27 DIAGNOSIS — Z2821 Immunization not carried out because of patient refusal: Secondary | ICD-10-CM

## 2018-07-27 DIAGNOSIS — K5900 Constipation, unspecified: Secondary | ICD-10-CM | POA: Insufficient documentation

## 2018-07-27 DIAGNOSIS — Z6841 Body Mass Index (BMI) 40.0 and over, adult: Secondary | ICD-10-CM | POA: Insufficient documentation

## 2018-07-27 DIAGNOSIS — M7732 Calcaneal spur, left foot: Secondary | ICD-10-CM | POA: Insufficient documentation

## 2018-07-27 DIAGNOSIS — F411 Generalized anxiety disorder: Secondary | ICD-10-CM | POA: Insufficient documentation

## 2018-07-27 DIAGNOSIS — M1711 Unilateral primary osteoarthritis, right knee: Secondary | ICD-10-CM | POA: Insufficient documentation

## 2018-07-27 DIAGNOSIS — K219 Gastro-esophageal reflux disease without esophagitis: Secondary | ICD-10-CM | POA: Insufficient documentation

## 2018-07-27 DIAGNOSIS — I10 Essential (primary) hypertension: Secondary | ICD-10-CM

## 2018-07-27 DIAGNOSIS — F4310 Post-traumatic stress disorder, unspecified: Secondary | ICD-10-CM | POA: Insufficient documentation

## 2018-07-27 DIAGNOSIS — G4733 Obstructive sleep apnea (adult) (pediatric): Secondary | ICD-10-CM

## 2018-07-27 DIAGNOSIS — Z7951 Long term (current) use of inhaled steroids: Secondary | ICD-10-CM | POA: Insufficient documentation

## 2018-07-27 MED ORDER — AMLODIPINE BESYLATE 10 MG PO TABS: 10.0000 mg | ORAL_TABLET | Freq: Every day | ORAL | 6 refills | Status: DC

## 2018-07-27 MED ORDER — DULOXETINE HCL 20 MG PO CPEP: 40.0000 mg | ORAL_CAPSULE | Freq: Every day | ORAL | 0 refills | Status: DC

## 2018-07-27 MED FILL — DULoxetine HCL 20 MG CPEP: 20 | 30 days supply | Qty: 60 | Fill #0

## 2018-07-27 NOTE — Progress Notes (Signed)
Pt. Is here to follow 3 months for anxiety.   Pt. Also wanted to talk to PCP regarding sleep apnea.

## 2018-07-27 NOTE — Patient Instructions (Signed)
Increase Amlodipine to 10mg daily.

## 2018-07-27 NOTE — Progress Notes (Addendum)
Patient ID: Amy Perez, female    DOB: 1965/11/07  MRN: [id number]  CC: No chief complaint on file.   Subjective: Amy Perez is a 52 y.o. female who presents for chronic ds managment Her concerns today include:  Pt with hx of HTN, GERD, anxiety/depression, bone spurs in heels   OSA: Since last visit she had sleep study that confirmed sleep apnea.  She is awaiting a refurbish CPAP machine.  Our caseworker is helping with this.  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 She sees her therapist 2 x a mth.  Diagnosed with PTSD.  Will see Dr. Celene Kras on the 24th. She is currently unemployed and is considering applying for disability because of her physical and mental health issues  Obesity: She is surprise about wgh gain.  Ever since receiving the diagnosis of sleep apnea she has started working on trying to get her weight down. -she has cut back on fried foods. She has cut back on portion sizes.  Admits that she needs to snack less She plans to start walking when the weather cools off.  Otherwise she tries to park further away from buildings and does some leg exercises  HTN: Reports compliance with amlodipine.  She does have a blood pressure monitor at home but has not been using it.  No chest pains or lower extremity edema Patient Active Problem List   Diagnosis Date Noted  . Hypomagnesemia 03/27/2018  . Constipation 12/08/2017  . Taking multiple medications for chronic disease 12/08/2017  . Primary osteoarthritis of right knee 04/21/2017  . Acute bacterial sinusitis 11/07/2016  . Insomnia 07/02/2016  . Carpal tunnel syndrome 06/29/2016  . Gastroesophageal reflux disease without esophagitis 04/26/2016  . Essential hypertension 04/26/2016  . Generalized anxiety disorder 04/26/2016  . OBESITY, MORBID 03/01/2007     Current Outpatient Medications on File Prior to Visit  Medication Sig Dispense Refill  . atorvastatin (LIPITOR) 10  MG tablet Take 1 tablet (10 mg total) by mouth daily. 90 tablet 3  . DULoxetine 40 MG CPEP Take 40 mg by mouth daily. 90 capsule 0  . fluticasone (FLONASE) 50 MCG/ACT nasal spray Place 2 sprays into both nostrils daily. (Patient taking differently: Place 2 sprays into both nostrils as needed. ) 16 g 0  . loratadine (CLARITIN) 10 MG tablet Take 1 tablet (10 mg total) by mouth daily. 30 tablet 2  . meloxicam (MOBIC) 15 MG tablet Take 1 tablet (15 mg total) by mouth daily. 30 tablet 5  . Multiple Vitamin (MULTIVITAMIN) tablet Take 1 tablet by mouth daily. Reported on 04/28/2016    . pantoprazole (PROTONIX) 40 MG tablet TAKE 1 TABLET BY MOUTH DAILY. 30 tablet 2  . polyethylene glycol powder (GLYCOLAX/MIRALAX) powder Take 17 g by mouth daily as needed. (Patient taking differently: Take 17 g by mouth daily as needed (constipation). ) 3350 g 5  . ranitidine (ZANTAC) 150 MG tablet Take 1 tablet (150 mg total) by mouth daily as needed for heartburn. 30 tablet 2  . diclofenac sodium (VOLTAREN) 1 % GEL Apply 2 g topically 4 (four) times daily. (Patient not taking: Reported on 07/27/2018) 100 g 0  . esomeprazole (NEXIUM) 40 MG capsule TAKE 1 CAPSULE BY MOUTH DAILY. (Patient not taking: Reported on 07/27/2018) 30 capsule 2   No current facility-administered medications on file prior to visit.     Allergies  Allergen Reactions  . Benadryl [Diphenhydramine Hcl] Itching  . Milk-Related Compounds  Diarrhea  . Oxycodone-Acetaminophen Nausea And Vomiting  . Penicillins Nausea Only    Amoxicillin Has patient had a PCN reaction causing immediate rash, facial/tongue/throat swelling, SOB or lightheadedness with hypotension:__________ Has patient had a PCN reaction causing severe rash involving mucus membranes or skin necrosis:________ Has patient had a PCN reaction that required hospitalization:________ Has patient had a PCN reaction occurring within the last 10 years: ________ If all of the above answers are "NO",  then may proceed with Cephalosporin use.     Social History   Socioeconomic History  . Marital status: Single    Spouse name: Not on file  . Number of children: Not on file  . Years of education: Not on file  . Highest education level: Not on file  Occupational History  . Not on file  Social Needs  . Financial resource strain: Not on file  . Food insecurity:    Worry: Not on file    Inability: Not on file  . Transportation needs:    Medical: Not on file    Non-medical: Not on file  Tobacco Use  . Smoking status: Never Smoker  . Smokeless tobacco: Never Used  Substance and Sexual Activity  . Alcohol use: No  . Drug use: No  . Sexual activity: Not on file  Lifestyle  . Physical activity:    Days per week: Not on file    Minutes per session: Not on file  . Stress: Not on file  Relationships  . Social connections:    Talks on phone: Not on file    Gets together: Not on file    Attends religious service: Not on file    Active member of club or organization: Not on file    Attends meetings of clubs or organizations: Not on file    Relationship status: Not on file  . Intimate partner violence:    Fear of current or ex partner: Not on file    Emotionally abused: Not on file    Physically abused: Not on file    Forced sexual activity: Not on file  Other Topics Concern  . Not on file  Social History Narrative  . Not on file    Family History  Problem Relation Age of Onset  . Hypertension Father 49       Deceased  . Heart attack Father   . Hypertension Mother        Living  . Arthritis Mother   . Diabetes Mother   . Dementia Mother   . Depression Mother   . Arthritis Sister        x2  . Lung cancer Sister        Deceased  . Cancer Other        Paternal & Maternal Aunts  . Heart attack Maternal Grandmother   . Neuropathy Sister   . Heart disease Brother        x1  . Alcoholism Brother        x2  . Colon cancer Neg Hx     Past Surgical History:    Procedure Laterality Date  . CHOLECYSTECTOMY    . COLONOSCOPY WITH PROPOFOL N/A 01/09/2018   Procedure: COLONOSCOPY WITH PROPOFOL;  Surgeon: Corbin Ade, MD;  [facility];  Service: Endoscopy;  Laterality: N/A;  8:45am  . TUBAL LIGATION  08/29/04  . WISDOM TOOTH EXTRACTION      ROS: Review of Systems Negative except as above PHYSICAL EXAM: BP 138/85 (BP  Location: Left Arm, Patient Position: Sitting, Cuff Size: Large)   Pulse (!) 101   Temp 98.1 F (36.7 C) (Oral)   Ht 5\' 6"  (1.676 m)   Wt 289 lb (131.1 kg)   LMP 12/14/2017   SpO2 95%   BMI 46.65 kg/m   Wt Readings from Last 3 Encounters:  07/27/18 289 lb (131.1 kg)  06/20/18 275 lb (124.7 kg)  06/06/18 270 lb (122.5 kg)   Physical Exam  General appearance -morbidly obese female in NAD Mental status - normal mood, behavior, speech, dress, motor activity, and thought processes Chest - clear to auscultation, no wheezes, rales or rhonchi, symmetric air entry Heart - normal rate, regular rhythm, normal S1, S2, no murmurs, rubs, clicks or gallops Extremities - peripheral pulses normal, no pedal edema, no clubbing or cyanosis   ASSESSMENT AND PLAN: 1. Essential hypertension Not at goal.  Increase amlodipine to 10 mg. Encouraged her to check blood pressure several times a week with goal of 130/80 or lower. - amLODipine (NORVASC) 10 MG tablet; Take 1 tablet (10 mg total) by mouth daily.  Dispense: 30 tablet; Refill: 6  2. Morbid obesity (HCC) I spent about 5 minutes doing some dietary counseling with her.  Advised her to stop drinking regular sodas and avoid other sugary drinks.  Discussed limiting portion sizes, decreasing intake of white carbohydrates and eating more white meat than red meat. We also discussed the importance of regular aerobic exercise.  Recommendation is at least 150 minutes a week.  I advised her to start low on go slow. - Amb ref to Medical Nutrition Therapy-MNT  3. OSA (obstructive sleep  apnea) Await CPAP machine.  Case manager assisting with this  4. Influenza vaccination declined She reports that she will get the flu shot at a later date. . 5.  Depression/PTSD -I told patient that based on her medical diagnoses from me I do not think that it would qualify her for disability.  She can speak with her psychiatrist and see what he thinks about her psychiatric diagnoses i.e. whether it is significant enough to qualify her for disability.  Patient was given the opportunity to ask questions.  Patient verbalized understanding of the plan and was able to repeat key elements of the plan.   Orders Placed This Encounter  Procedures  . Amb ref to Medical Nutrition Therapy-MNT     Requested Prescriptions   Signed Prescriptions Disp Refills  . amLODipine (NORVASC) 10 MG tablet 30 tablet 6    Sig: Take 1 tablet (10 mg total) by mouth daily.    Return in about 3 months (around 10/27/2018).  Jonah Blue, MD, FACP

## 2018-07-28 MED FILL — AMLODIPINE BESYLATE 10 MG T: 10 | 30 days supply | Qty: 30 | Fill #0

## 2018-08-07 ENCOUNTER — Telehealth: Payer: Self-pay

## 2018-08-07 NOTE — Telephone Encounter (Signed)
CPAP received.  Spoke to [facility] Respiratory Therapy Dept and scheduled an appointment for CPAP teaching 08/09/18 @ 1030 @ [facility]. Marland Kitchen  Call placed to the patient and informed her of the date/time of the appointment.

## 2018-08-09 ENCOUNTER — Telehealth: Payer: Self-pay

## 2018-08-09 NOTE — Telephone Encounter (Signed)
Patient met with Amy Perez, RT/[place] for instruction regarding the use/care of the CPAP machine. After successful completion of the teaching, she was given her CPAP machine.

## 2018-08-10 ENCOUNTER — Telehealth: Payer: Self-pay

## 2018-08-10 DIAGNOSIS — G4733 Obstructive sleep apnea (adult) (pediatric): Secondary | ICD-10-CM

## 2018-08-10 NOTE — Telephone Encounter (Signed)
Pt called into [facility] and call routed to triage as Amy Perez the care coordinator working with pt was off today. Pt said last night was the first night that she has used the CPCP.  She c/o "air coming in me" and it scared her. She also said the machine got more forceful with the air and the sound changed to sort of a rattle.  Pt made it clear that she was not interested in hearing an explanation about how the CPAP works and what to expect.  She said she wants a referral  to see the doctor by Wonda Olds because he will help her more.  She said her friend went to him and he helped her. I told Azariya that Amy Perez is the only one who works with patients and the donated Cpaps and thatI I would give Amy Perez her message. She said that was satisfactory,

## 2018-08-11 NOTE — Telephone Encounter (Signed)
Call placed to the patient. She said that she is not sure that he CPAP is working correctly.  She just received it 2 days ago. It is a [employer] from the [employer]. She is requesting a referral to pulmonary and can be referred to [facility] pulmonary or she can see Dr Delford Field at [facility].  Please advise.

## 2018-08-17 ENCOUNTER — Encounter (HOSPITAL_COMMUNITY): Payer: Self-pay | Admitting: Psychiatry

## 2018-08-17 ENCOUNTER — Ambulatory Visit (INDEPENDENT_AMBULATORY_CARE_PROVIDER_SITE_OTHER): Payer: Self-pay | Admitting: Psychiatry

## 2018-08-17 VITALS — BP 126/83 | HR 90 | Ht 66.0 in | Wt 292.0 lb

## 2018-08-17 DIAGNOSIS — F411 Generalized anxiety disorder: Secondary | ICD-10-CM

## 2018-08-17 DIAGNOSIS — F331 Major depressive disorder, recurrent, moderate: Secondary | ICD-10-CM

## 2018-08-17 MED ORDER — BUPROPION HCL ER (XL) 150 MG PO TB24: 150.0000 mg | ORAL_TABLET | Freq: Every day | ORAL | 1 refills | Status: DC

## 2018-08-17 MED ORDER — TRAZODONE HCL 50 MG PO TABS: ORAL_TABLET | ORAL | 0 refills | Status: DC

## 2018-08-17 NOTE — Progress Notes (Signed)
BH MD/PA/NP OP Progress Note  08/17/2018 3:26 PM Amy Perez  MRN:  [id number]  Chief Complaint: I do not think Cymbalta working.  It is causing weight gain.  I still feel depressed and irritable.  HPI: Amy Perez came for her follow-up appointment.  She was last seen in May 2019.  We have reduce Cymbalta dose because 60 mg was causing her shortness of breath and she was not happy with the weight gain.  She also tried Zoloft however after taking 2 doses she stopped because she was feeling more anxious, hungry, tired and did not like the effect.  Patient is concerned about medication side effects.  She still feels depressed sad and irritable.  She continues to have hopelessness and sometimes having crying spells.  However she denies any suicidal thoughts or homicidal thought.  She gets easily overwhelmed.  She is working but sometime she feels overwhelmed.  She has gained another 6 to 8 pounds in recent months.  Her energy level is fair.  She lives with her boyfriend.  She has no children.  She denies drinking or using any illegal substances.  He started therapy with Amy Perez however since Amy Perez left she is [facility] [place].  Visit Diagnosis:    ICD-10-CM   1. Moderate episode of recurrent major depressive disorder (HCC) F33.1 buPROPion (WELLBUTRIN XL) 150 MG 24 hr tablet    traZODone (DESYREL) 50 MG tablet  2. GAD (generalized anxiety disorder) F41.1 buPROPion (WELLBUTRIN XL) 150 MG 24 hr tablet    Past Psychiatric History: Reviewed. Patient denies any history of psychiatric inpatient treatment or any suicidal attempt. Her primary care physician is started her on Cymbalta and Dr. Rene Perez increased to 60 mg but patient is having side effects. So tried Zoloft however after taking 2 doses she stopped because feeling tired, hungry and more anxious.    Past Medical History:  Past Medical History:  Diagnosis Date  . AC (acromioclavicular) joint bone  spurs   . Acid reflux   . Anxiety   . Blood dyscrasia   . CTS (carpal tunnel syndrome)    Bilateral  . Depression   . Gastric ulcer   . GERD (gastroesophageal reflux disease)   . Headache   . Heel spur   . History of chicken pox   . Hypertension   . Panic attacks   . Plantar fasciitis   . Sickle cell trait (HCC)   . Tendonitis    Feet    Past Surgical History:  Procedure Laterality Date  . CHOLECYSTECTOMY    . COLONOSCOPY WITH PROPOFOL N/A 01/09/2018   Procedure: COLONOSCOPY WITH PROPOFOL;  Surgeon: Corbin Ade, MD;  [facility];  Service: Endoscopy;  Laterality: N/A;  8:45am  . TUBAL LIGATION  08/29/04  . WISDOM TOOTH EXTRACTION      Family Psychiatric History: Reviewed  Family History:  Family History  Problem Relation Age of Onset  . Hypertension Father 31       Deceased  . Heart attack Father   . Hypertension Mother        Living  . Arthritis Mother   . Diabetes Mother   . Dementia Mother   . Depression Mother   . Arthritis Sister        x2  . Lung cancer Sister        Deceased  . Cancer Other        Paternal & Maternal Aunts  . Heart  attack Maternal Grandmother   . Neuropathy Sister   . Heart disease Brother        x1  . Alcoholism Brother        x2  . Colon cancer Neg Hx     Social History:  Social History   Socioeconomic History  . Marital status: Single    Spouse name: Not on file  . Number of children: Not on file  . Years of education: Not on file  . Highest education level: Not on file  Occupational History  . Not on file  Social Needs  . Financial resource strain: Not on file  . Food insecurity:    Worry: Not on file    Inability: Not on file  . Transportation needs:    Medical: Not on file    Non-medical: Not on file  Tobacco Use  . Smoking status: Never Smoker  . Smokeless tobacco: Never Used  Substance and Sexual Activity  . Alcohol use: No  . Drug use: No  . Sexual activity: Not on file  Lifestyle  .  Physical activity:    Days per week: Not on file    Minutes per session: Not on file  . Stress: Not on file  Relationships  . Social connections:    Talks on phone: Not on file    Gets together: Not on file    Attends religious service: Not on file    Active member of club or organization: Not on file    Attends meetings of clubs or organizations: Not on file    Relationship status: Not on file  Other Topics Concern  . Not on file  Social History Narrative  . Not on file    Allergies:  Allergies  Allergen Reactions  . Benadryl [Diphenhydramine Hcl] Itching  . Milk-Related Compounds Diarrhea  . Oxycodone-Acetaminophen Nausea And Vomiting  . Penicillins Nausea Only    Amoxicillin Has patient had a PCN reaction causing immediate rash, facial/tongue/throat swelling, SOB or lightheadedness with hypotension:__________ Has patient had a PCN reaction causing severe rash involving mucus membranes or skin necrosis:________ Has patient had a PCN reaction that required hospitalization:________ Has patient had a PCN reaction occurring within the last 10 years: ________ If all of the above answers are "NO", then may proceed with Cephalosporin use.     Metabolic Disorder Labs: Lab Results  Component Value Date   HGBA1C 5.4 04/24/2018   No results found for: PROLACTIN Lab Results  Component Value Date   CHOL 245 (H) 09/23/2017   TRIG 124 09/23/2017   HDL 51 09/23/2017   CHOLHDL 4.8 (H) 09/23/2017   LDLCALC 169 (H) 09/23/2017   Lab Results  Component Value Date   TSH 1.830 04/24/2018    Therapeutic Level Labs: No results found for: LITHIUM No results found for: VALPROATE No components found for:  CBMZ  Current Medications: Current Outpatient Medications  Medication Sig Dispense Refill  . amLODipine (NORVASC) 10 MG tablet Take 1 tablet (10 mg total) by mouth daily. 30 tablet 6  . atorvastatin (LIPITOR) 10 MG tablet Take 1 tablet (10 mg total) by mouth daily. 90 tablet 3   . diclofenac sodium (VOLTAREN) 1 % GEL Apply 2 g topically 4 (four) times daily. 100 g 0  . DULoxetine (CYMBALTA) 20 MG capsule Take 2 capsules (40 mg total) by mouth daily. 60 capsule 0  . DULoxetine 40 MG CPEP Take 40 mg by mouth daily. 90 capsule 0  . esomeprazole (NEXIUM) 40 MG  capsule TAKE 1 CAPSULE BY MOUTH DAILY. 30 capsule 2  . fluticasone (FLONASE) 50 MCG/ACT nasal spray Place 2 sprays into both nostrils daily. (Patient taking differently: Place 2 sprays into both nostrils as needed. ) 16 g 0  . loratadine (CLARITIN) 10 MG tablet Take 1 tablet (10 mg total) by mouth daily. 30 tablet 2  . meloxicam (MOBIC) 15 MG tablet Take 1 tablet (15 mg total) by mouth daily. 30 tablet 5  . Multiple Vitamin (MULTIVITAMIN) tablet Take 1 tablet by mouth daily. Reported on 04/28/2016    . pantoprazole (PROTONIX) 40 MG tablet TAKE 1 TABLET BY MOUTH DAILY. 30 tablet 2  . polyethylene glycol powder (GLYCOLAX/MIRALAX) powder Take 17 g by mouth daily as needed. (Patient not taking: Reported on 08/17/2018) 3350 g 5  . ranitidine (ZANTAC) 150 MG tablet Take 1 tablet (150 mg total) by mouth daily as needed for heartburn. (Patient not taking: Reported on 08/17/2018) 30 tablet 2   No current facility-administered medications for this visit.      Musculoskeletal: Strength & Muscle Tone: within normal limits Gait & Station: normal Patient leans: N/A  Psychiatric Specialty Exam: ROS  Blood pressure 126/83, pulse 90, height 5\' 6"  (1.676 m), weight 292 lb (132.5 kg), last menstrual period 12/14/2017, SpO2 98 %.Body mass index is 47.13 kg/m.  General Appearance: Casual  Eye Contact:  Fair  Speech:  Slow  Volume:  Decreased  Mood:  Anxious and Dysphoric  Affect:  Congruent  Thought Process:  Goal Directed  Orientation:  Full (Time, Place, and Person)  Thought Content: Rumination   Suicidal Thoughts:  No  Homicidal Thoughts:  No  Memory:  Immediate;   Good Recent;   Good Remote;   Good  Judgement:  Fair   Insight:  Fair  Psychomotor Activity:  Decreased  Concentration:  Concentration: Fair and Attention Span: Fair  Recall:  Good  Fund of Knowledge: Good  Language: Good  Akathisia:  No  Handed:  Right  AIMS (if indicated): not done  Assets:  Communication Skills Desire for Improvement Housing Talents/Skills  ADL's:  Intact  Cognition: WNL  Sleep:  Fair   Screenings: GAD-7     Office Visit from 04/24/2018 in [facility] Office Visit from 12/23/2017 in [facility] Office Visit from 11/08/2017 in [facility] Office Visit from 09/23/2017 in [facility] Office Visit from 04/21/2017 in [facility]  Total GAD-7 Score  14  14  12  14  18     PHQ2-9     Office Visit from 04/24/2018 in [facility] Office Visit from 12/23/2017 in [facility] Office Visit from 11/08/2017 in [facility] Office Visit from 09/23/2017 in [facility] Office Visit from 04/21/2017 in [facility]  PHQ-2 Total Score  4  4  2  4  1   PHQ-9 Total Score  18  18  16  18   -       Assessment and Plan: Major depressive disorder, recurrent.  Generalized anxiety disorder.  Patient is not happy with Cymbalta because she feel it is not working and also she had gained weight.  I will discontinue Cymbalta.  She never tried Wellbutrin and recommended to try Wellbutrin XL 140 mg daily to help her focus, energy, depression and may cause  less craving of food.  However I also encouraged that she need to walk every day 10 to 15 minutes.  Continue therapy with [facility] [place].  I also recommended to resume trazodone which she has never filled for insomnia.  She admitted scared from the medication side effects however I explained that  trazodone is not addicted medicine.  We will also do gene testing and she agreed.  Recommended to call us back if is any question or any concern.  I will see her again in 4 to 6 weeks.  Discussed medication side effects especially Wellbutrin can cause in the beginning headache and insomnia.   Cleotis Nipper, MD 08/17/2018, 3:26 PM

## 2018-08-18 MED FILL — ?TRAZODONE HCL 50MG TABS: 50 | 15 days supply | Qty: 30 | Fill #0

## 2018-08-18 MED FILL — BUPROPION HCL XL 150 MG TAB: 150 | 30 days supply | Qty: 30 | Fill #0

## 2018-08-24 MED FILL — FLUTICASONE PROP 50 MCG SPR: 50 | 30 days supply | Qty: 16 | Fill #1

## 2018-08-24 MED FILL — POLYETHYLENE GLYCOL 3350 PO: 14 days supply | Qty: 238 | Fill #1

## 2018-08-28 MED FILL — MELOXICAM 15 MG TABLET: 15 | 30 days supply | Qty: 30 | Fill #4

## 2018-08-28 MED FILL — ?ATORVASTATIN 10 MG TABLET: 10 | 30 days supply | Qty: 30 | Fill #11

## 2018-08-28 MED FILL — ?PANTOPRAZOLE SOD DR 40MG T: 40 | 30 days supply | Qty: 30 | Fill #1

## 2018-09-08 MED FILL — AMLODIPINE BESYLATE 10 MG T: 10 | 30 days supply | Qty: 30 | Fill #1

## 2018-09-12 ENCOUNTER — Telehealth: Payer: Self-pay | Admitting: Internal Medicine

## 2018-09-12 MED FILL — BUPROPION HCL XL 150 MG TAB: 150 | 30 days supply | Qty: 30 | Fill #1

## 2018-09-12 NOTE — Telephone Encounter (Signed)
yahir could you schedule pt an appointment  

## 2018-09-12 NOTE — Telephone Encounter (Signed)
Will forward to pcp

## 2018-09-12 NOTE — Telephone Encounter (Signed)
Patient called because they would like to be referred to a hand specialist because their hands are hurting. Please follow up.

## 2018-09-15 ENCOUNTER — Ambulatory Visit: Payer: Self-pay | Attending: Internal Medicine | Admitting: Physician Assistant

## 2018-09-15 VITALS — BP 137/82 | HR 90 | Temp 98.3°F | Ht 66.0 in | Wt 294.0 lb

## 2018-09-15 DIAGNOSIS — Z79899 Other long term (current) drug therapy: Secondary | ICD-10-CM | POA: Insufficient documentation

## 2018-09-15 DIAGNOSIS — Z8719 Personal history of other diseases of the digestive system: Secondary | ICD-10-CM | POA: Insufficient documentation

## 2018-09-15 DIAGNOSIS — M722 Plantar fascial fibromatosis: Secondary | ICD-10-CM | POA: Insufficient documentation

## 2018-09-15 DIAGNOSIS — M7730 Calcaneal spur, unspecified foot: Secondary | ICD-10-CM | POA: Insufficient documentation

## 2018-09-15 DIAGNOSIS — F419 Anxiety disorder, unspecified: Secondary | ICD-10-CM | POA: Insufficient documentation

## 2018-09-15 DIAGNOSIS — D573 Sickle-cell trait: Secondary | ICD-10-CM | POA: Insufficient documentation

## 2018-09-15 DIAGNOSIS — K219 Gastro-esophageal reflux disease without esophagitis: Secondary | ICD-10-CM | POA: Insufficient documentation

## 2018-09-15 DIAGNOSIS — Z7951 Long term (current) use of inhaled steroids: Secondary | ICD-10-CM | POA: Insufficient documentation

## 2018-09-15 DIAGNOSIS — F329 Major depressive disorder, single episode, unspecified: Secondary | ICD-10-CM | POA: Insufficient documentation

## 2018-09-15 DIAGNOSIS — G5603 Carpal tunnel syndrome, bilateral upper limbs: Secondary | ICD-10-CM | POA: Insufficient documentation

## 2018-09-15 DIAGNOSIS — Z791 Long term (current) use of non-steroidal anti-inflammatories (NSAID): Secondary | ICD-10-CM | POA: Insufficient documentation

## 2018-09-15 DIAGNOSIS — I10 Essential (primary) hypertension: Secondary | ICD-10-CM | POA: Insufficient documentation

## 2018-09-15 NOTE — Progress Notes (Signed)
Patient ID: Amy Perez, female   DOB: 13-Jun-1966, 52 y.o.   MRN: [id number]    Amy RuffiniMichelle Perez, is a 52 y.o. female  [id number]  [id number]  DOB - 13-Jun-1966  Subjective:  Chief Complaint and HPI: Amy RuffiniMichelle Perez is a 52 y.o. female here today with continued trouble with B hands and wrists with CTS.  She is sleeping in splints and taking meloxicam but feels condition is worsening.    ROS:   Constitutional:  No f/c, No night sweats, No unexplained weight loss. EENT:  No vision changes, No blurry vision, No hearing changes. No mouth, throat, or ear problems.  Respiratory: No cough, No SOB Cardiac: No CP, no palpitations GI:  No abd pain, No N/V/D. GU: No Urinary s/sx Musculoskeletal: B hand pain Neuro: No headache, no dizziness, no motor weakness.  Skin: No rash Endocrine:  No polydipsia. No polyuria.  Psych: Denies SI/HI  No problems updated.  ALLERGIES: Allergies  Allergen Reactions  . Benadryl [Diphenhydramine Hcl] Itching  . Milk-Related Compounds Diarrhea  . Oxycodone-Acetaminophen Nausea And Vomiting  . Penicillins Nausea Only    Amoxicillin Has patient had a PCN reaction causing immediate rash, facial/tongue/throat swelling, SOB or lightheadedness with hypotension:__________ Has patient had a PCN reaction causing severe rash involving mucus membranes or skin necrosis:________ Has patient had a PCN reaction that required hospitalization:________ Has patient had a PCN reaction occurring within the last 10 years: ________ If all of the above answers are "NO", then may proceed with Cephalosporin use.     PAST MEDICAL HISTORY: Past Medical History:  Diagnosis Date  . AC (acromioclavicular) joint bone spurs   . Acid reflux   . Anxiety   . Blood dyscrasia   . CTS (carpal tunnel syndrome)    Bilateral  . Depression   . Gastric ulcer   . GERD (gastroesophageal reflux disease)   . Headache   . Heel spur   . History of chicken pox   . Hypertension    . Panic attacks   . Plantar fasciitis   . Sickle cell trait (HCC)   . Tendonitis    Feet    MEDICATIONS AT HOME: Prior to Admission medications   Medication Sig Start Date End Date Taking? Authorizing Provider  amLODipine (NORVASC) 10 MG tablet Take 1 tablet (10 mg total) by mouth daily. 07/27/18  Yes Marcine MatarJohnson, Deborah B, MD  atorvastatin (LIPITOR) 10 MG tablet Take 1 tablet (10 mg total) by mouth daily. 09/25/17  Yes Marcine MatarJohnson, Deborah B, MD  buPROPion (WELLBUTRIN XL) 150 MG 24 hr tablet Take 1 tablet (150 mg total) by mouth daily. 08/17/18  Yes Arfeen, Phillips GroutSyed T, MD  diclofenac sodium (VOLTAREN) 1 % GEL Apply 2 g topically 4 (four) times daily. 06/06/18  Yes Caccavale, Sophia, PA-C  esomeprazole (NEXIUM) 40 MG capsule TAKE 1 CAPSULE BY MOUTH DAILY. 10/28/17  Yes Marcine MatarJohnson, Deborah B, MD  fluticasone (FLONASE) 50 MCG/ACT nasal spray Place 2 sprays into both nostrils daily. Patient taking differently: Place 2 sprays into both nostrils as needed.  11/17/16  Yes Waldon MerlMartin, William C, PA-C  loratadine (CLARITIN) 10 MG tablet Take 1 tablet (10 mg total) by mouth daily. 11/08/17  Yes Marcine MatarJohnson, Deborah B, MD  meloxicam (MOBIC) 15 MG tablet Take 1 tablet (15 mg total) by mouth daily. 04/24/18  Yes Marcine MatarJohnson, Deborah B, MD  Multiple Vitamin (MULTIVITAMIN) tablet Take 1 tablet by mouth daily. Reported on 04/28/2016   Yes [provider]  pantoprazole (PROTONIX) 40 MG tablet TAKE 1  TABLET BY MOUTH DAILY. 07/24/18  Yes Marcine Matar, MD  traZODone (DESYREL) 50 MG tablet Take 1-2 tab at bed time 08/17/18  Yes Arfeen, Phillips Grout, MD     Objective:  EXAM:   Vitals:   09/15/18 1403  BP: 137/82  Pulse: 90  Temp: 98.3 F (36.8 C)  TempSrc: Oral  SpO2: 97%  Weight: 294 lb (133.4 kg)  Height: 5\' 6"  (1.676 m)    General appearance : A&OX3. NAD. Non-toxic-appearing HEENT: Atraumatic and Normocephalic.  PERRLA. EOM intact.  TM clear B. Chest/Lungs:  Breathing-non-labored, Good air entry bilaterally, breath  sounds normal without rales, rhonchi, or wheezing  CVS: S1 S2 regular, no murmurs, gallops, rubs  B hands normal grip S&ROM.  +phalen's B, neg Tinel's B.  Extremities: Bilateral Lower Ext shows no edema, both legs are warm to touch with = pulse throughout Neurology:  CN II-XII grossly intact, Non focal.   Psych:  TP linear. J/I WNL. Normal speech. Appropriate eye contact and affect.  Skin:  No Rash  Data Review Lab Results  Component Value Date   HGBA1C 5.4 04/24/2018   HGBA1C 5.4 04/21/2017     Assessment & Plan   1. Bilateral carpal tunnel syndrome Continue splints at night.  Also wear them in the daytime as needed.  Continue Meloxicam. - Ambulatory referral to Hand Surgery  Patient have been counseled extensively about nutrition and exercise  Return for appt 10/27/2018 with Dr Laural Benes.  The patient was given clear instructions to go to ER or return to medical center if symptoms don't improve, worsen or new problems develop. The patient verbalized understanding. The patient was told to call to get lab results if they haven't heard anything in the next week.     Amy Co, PA-C [facility] [place], [place] [phone]   09/15/2018, 2:22 PM

## 2018-09-15 NOTE — Patient Instructions (Signed)
Carpal Tunnel Syndrome Carpal tunnel syndrome is a condition that causes pain in your hand and arm. The carpal tunnel is a narrow area that is on the palm side of your wrist. Repeated wrist motion or certain diseases may cause swelling in the tunnel. This swelling can pinch the main nerve in the wrist (median nerve). Follow these instructions at home: If you have a splint:  Wear it as told by your doctor. Remove it only as told by your doctor.  Loosen the splint if your fingers: ? Become numb and tingle. ? Turn blue and cold.  Keep the splint clean and dry. General instructions  Take over-the-counter and prescription medicines only as told by your doctor.  Rest your wrist from any activity that may be causing your pain. If needed, talk to your employer about changes that can be made in your work, such as getting a wrist pad to use while typing.  If directed, apply ice to the painful area: ? Put ice in a plastic bag. ? Place a towel between your skin and the bag. ? Leave the ice on for 20 minutes, 2-3 times per day.  Keep all follow-up visits as told by your doctor. This is important.  Do any exercises as told by your doctor, physical therapist, or occupational therapist. Contact a doctor if:  You have new symptoms.  Medicine does not help your pain.  Your symptoms get worse. This information is not intended to replace advice given to you by your health care provider. Make sure you discuss any questions you have with your health care provider. Document Released: 09/30/2011 Document Revised: 03/18/2016 Document Reviewed: 02/26/2015 Elsevier Interactive Patient Education  2018 Elsevier Inc.  

## 2018-09-25 ENCOUNTER — Ambulatory Visit (INDEPENDENT_AMBULATORY_CARE_PROVIDER_SITE_OTHER): Payer: Self-pay | Admitting: Physician Assistant

## 2018-09-25 ENCOUNTER — Encounter (INDEPENDENT_AMBULATORY_CARE_PROVIDER_SITE_OTHER): Payer: Self-pay | Admitting: Physician Assistant

## 2018-09-25 ENCOUNTER — Other Ambulatory Visit: Payer: Self-pay | Admitting: Internal Medicine

## 2018-09-25 DIAGNOSIS — G5603 Carpal tunnel syndrome, bilateral upper limbs: Secondary | ICD-10-CM

## 2018-09-25 DIAGNOSIS — M1711 Unilateral primary osteoarthritis, right knee: Secondary | ICD-10-CM

## 2018-09-25 MED ORDER — LIDOCAINE HCL 1 % IJ SOLN
3.0000 mL | INTRAMUSCULAR | Status: AC | PRN
  Administered 2018-09-25: 3 mL

## 2018-09-25 MED ORDER — METHYLPREDNISOLONE ACETATE 40 MG/ML IJ SUSP
40.0000 mg | INTRAMUSCULAR | Status: AC | PRN
  Administered 2018-09-25: 40 mg via INTRA_ARTICULAR

## 2018-09-25 MED FILL — ATORVASTATIN 10 MG TABLET: 10 | 30 days supply | Qty: 30 | Fill #0

## 2018-09-25 MED FILL — ?PANTOPRAZOLE SO DR 40MG TA: 40 | 30 days supply | Qty: 30 | Fill #2

## 2018-09-25 MED FILL — MELOXICAM 15 MG TABLET: 15 | 30 days supply | Qty: 30 | Fill #5

## 2018-09-25 NOTE — Progress Notes (Signed)
Office Visit Note   Patient: Amy Perez           Date of Birth: 26-Feb-1966           MRN: [id number] Visit Date: 09/25/2018              Requested by: Anders Simmonds, PA-C [address] [place], [place] [address] PCP: Marcine Matar, MD   Assessment & Plan: Visit Diagnoses:  1. Primary osteoarthritis of right knee   2. Bilateral carpal tunnel syndrome     Plan: We will have her undergo EMG nerve conduction studies bilateral upper extremities to rule out carpal tunnel syndrome.  She will follow-up after the studies to go over the results and discuss further treatment.  In regards to her right knee she will work on Dance movement psychotherapist.  Follow-Up Instructions: Return for After EMG/ NCS.   Orders:  Orders Placed This Encounter  Procedures  . Ambulatory referral to Physical Medicine Rehab   No orders of the defined types were placed in this encounter.     Procedures: Large Joint Inj: R knee on 09/25/2018 5:34 PM Indications: pain Details: 22 G 1.5 in needle, anterolateral approach  Arthrogram: No  Medications: 3 mL lidocaine 1 %; 40 mg methylPREDNISolone acetate 40 MG/ML Outcome: tolerated well, no immediate complications Procedure, treatment alternatives, risks and benefits explained, specific risks discussed. Consent was given by the patient. Immediately prior to procedure a time out was called to verify the correct patient, procedure, equipment, support staff and site/side marked as required. Patient was prepped and draped in the usual sterile fashion.       Clinical Data: No additional findings.   Subjective: Chief Complaint  Patient presents with  . Right Wrist - Pain  . Left Wrist - Pain    HPI Mrs. Robillard comes in today complaining of numbness tingling bilateral hands.  She is tried night splints Mobic and Voltaren gel without any real relief.  She states she feels both hands go to sleep on her.  She is right-hand dominant right hand is  worse than the left.  She said no known injury. She was last seen by Dr. Magnus Ivan in August 2018 at that time was given a cortisone injection in the knee.  She is complaining of right knee pain today and requesting another injection.  She had moderate patellofemoral arthritic changes on x-ray at that time and mild arthritic changes of the medial lateral compartments.  She has had no new injury to the knee. Review of Systems See HPI otherwise negative  Objective: Vital Signs: LMP 12/14/2017   Physical Exam General: Well-developed well-nourished female no acute distress mood affect appropriate Psych: Alert and oriented x3 Ortho Exam Bilateral hands sensation intact light touch throughout.  She has a positive Tinel's at the wrist bilaterally positive Phalen's bilaterally.  No rashes skin lesions ulcerations on either hand or wrist.  Radial pulses are intact bilaterally. Right knee good range of motion with significant patellofemoral crepitus.  No instability valgus varus stressing the knee.  No abnormal warmth erythema or edema about the right knee. Specialty Comments:  No specialty comments available.  Imaging: No results found.   PMFS History: Patient Active Problem List   Diagnosis Date Noted  . OSA (obstructive sleep apnea) 07/27/2018  . Hypomagnesemia 03/27/2018  . Constipation 12/08/2017  . Taking multiple medications for chronic disease 12/08/2017  . Primary osteoarthritis of right knee 04/21/2017  . Acute bacterial sinusitis 11/07/2016  . Insomnia  07/02/2016  . Carpal tunnel syndrome 06/29/2016  . Gastroesophageal reflux disease without esophagitis 04/26/2016  . Essential hypertension 04/26/2016  . Generalized anxiety disorder 04/26/2016  . OBESITY, MORBID 03/01/2007   Past Medical History:  Diagnosis Date  . AC (acromioclavicular) joint bone spurs   . Acid reflux   . Anxiety   . Blood dyscrasia   . CTS (carpal tunnel syndrome)    Bilateral  . Depression   . Gastric  ulcer   . GERD (gastroesophageal reflux disease)   . Headache   . Heel spur   . History of chicken pox   . Hypertension   . Panic attacks   . Plantar fasciitis   . Sickle cell trait (HCC)   . Tendonitis    Feet    Family History  Problem Relation Age of Onset  . Hypertension Father [id number]       Deceased  . Heart attack Father   . Hypertension Mother        Living  . Arthritis Mother   . Diabetes Mother   . Dementia Mother   . Depression Mother   . Arthritis Sister        x2  . Lung cancer Sister        Deceased  . Cancer Other        Paternal & Maternal Aunts  . Heart attack Maternal Grandmother   . Neuropathy Sister   . Heart disease Brother        x1  . Alcoholism Brother        x2  . Colon cancer Neg Hx     Past Surgical History:  Procedure Laterality Date  . CHOLECYSTECTOMY    . COLONOSCOPY WITH PROPOFOL N/A 01/09/2018   Procedure: COLONOSCOPY WITH PROPOFOL;  Surgeon: Corbin Adeourk, Robert M, MD;  Location: [facility];  Service: Endoscopy;  Laterality: N/A;  8:45am  . TUBAL LIGATION  08/29/04  . WISDOM TOOTH EXTRACTION     Social History   Occupational History  . Not on file  Tobacco Use  . Smoking status: Never Smoker  . Smokeless tobacco: Never Used  Substance and Sexual Activity  . Alcohol use: No  . Drug use: No  . Sexual activity: Not on file

## 2018-09-28 ENCOUNTER — Encounter: Payer: Self-pay | Admitting: Pulmonary Disease

## 2018-09-28 ENCOUNTER — Ambulatory Visit (INDEPENDENT_AMBULATORY_CARE_PROVIDER_SITE_OTHER): Payer: Self-pay | Admitting: Pulmonary Disease

## 2018-09-28 ENCOUNTER — Ambulatory Visit (HOSPITAL_COMMUNITY): Payer: Self-pay | Admitting: Psychiatry

## 2018-09-28 VITALS — BP 118/78 | HR 91 | Ht 65.0 in | Wt 292.2 lb

## 2018-09-28 DIAGNOSIS — G4733 Obstructive sleep apnea (adult) (pediatric): Secondary | ICD-10-CM

## 2018-09-28 NOTE — Patient Instructions (Addendum)
Obstructive sleep apnea with pressures seeming to high at present  We will try and assist with changing your pressure settings Will reduce your current pressures to 15 and follow-up with downloads from your machine Always bring the card with you whenever you come in so that we can download data from the machine Mask fitting   We will see her back in the office in about 3 months  Call with significant concerns

## 2018-09-28 NOTE — Progress Notes (Signed)
Amy Perez    [id number]    1966/05/05  Primary Care Physician:Johnson, Binnie Rail, MD  Referring Physician: Marcine Matar, MD [address] [place], [place] [address]  Chief complaint:   Patient with a history of obstructive sleep apnea Intolerance to current pressures  HPI:  Diagnoses obstructive sleep apnea and titrated to a pressure of 18 She has not been tolerant with the current pressure settings She feels the mask does not fit well  Stable bedtime, sometimes to go to bed at 3 PM Wakes up a few times during the night Wakes up different times in the mornings  Has been gaining weight History of PTSD, history of depression History of morbid obesity Hypertension   Outpatient Encounter Medications as of 09/28/2018  Medication Sig  . amLODipine (NORVASC) 10 MG tablet Take 1 tablet (10 mg total) by mouth daily.  Marland Kitchen atorvastatin (LIPITOR) 10 MG tablet TAKE 1 TABLET BY MOUTH DAILY.  Marland Kitchen buPROPion (WELLBUTRIN XL) 150 MG 24 hr tablet Take 1 tablet (150 mg total) by mouth daily.  . diclofenac sodium (VOLTAREN) 1 % GEL Apply 2 g topically 4 (four) times daily.  Marland Kitchen esomeprazole (NEXIUM) 40 MG capsule TAKE 1 CAPSULE BY MOUTH DAILY.  . fluticasone (FLONASE) 50 MCG/ACT nasal spray Place 2 sprays into both nostrils daily. (Patient taking differently: Place 2 sprays into both nostrils as needed. )  . loratadine (CLARITIN) 10 MG tablet Take 1 tablet (10 mg total) by mouth daily.  . meloxicam (MOBIC) 15 MG tablet Take 1 tablet (15 mg total) by mouth daily.  . Multiple Vitamin (MULTIVITAMIN) tablet Take 1 tablet by mouth daily. Reported on 04/28/2016  . pantoprazole (PROTONIX) 40 MG tablet TAKE 1 TABLET BY MOUTH DAILY.  Marland Kitchen PANTOPRAZOLE SODIUM PO   . polyethylene glycol powder (GLYCOLAX/MIRALAX) powder   . traZODone (DESYREL) 50 MG tablet Take 1-2 tab at bed time  . [DISCONTINUED] ATORVASTATIN-COENZYME Q10 PO   . [DISCONTINUED] TRAZODONE HCL PO    No  facility-administered encounter medications on file as of 09/28/2018.     Allergies as of 09/28/2018 - Review Complete 09/28/2018  Allergen Reaction Noted  . Benadryl [diphenhydramine hcl] Itching 11/06/2012  . Milk-related compounds Diarrhea 09/22/2016  . Oxycodone-acetaminophen Nausea And Vomiting 02/27/2007  . Penicillins Nausea Only 02/27/2007    Past Medical History:  Diagnosis Date  . AC (acromioclavicular) joint bone spurs   . Acid reflux   . Anxiety   . Blood dyscrasia   . CTS (carpal tunnel syndrome)    Bilateral  . Depression   . Gastric ulcer   . GERD (gastroesophageal reflux disease)   . Headache   . Heel spur   . History of chicken pox   . Hypertension   . Panic attacks   . Plantar fasciitis   . Sickle cell trait (HCC)   . Tendonitis    Feet    Past Surgical History:  Procedure Laterality Date  . CHOLECYSTECTOMY    . COLONOSCOPY WITH PROPOFOL N/A 01/09/2018   Procedure: COLONOSCOPY WITH PROPOFOL;  Surgeon: Corbin Ade, MD;  Location: [facility];  Service: Endoscopy;  Laterality: N/A;  8:45am  . TUBAL LIGATION  08/29/04  . WISDOM TOOTH EXTRACTION      Family History  Problem Relation Age of Onset  . Hypertension Father 18       Deceased  . Heart attack Father   . Hypertension Mother  Living  . Arthritis Mother   . Diabetes Mother   . Dementia Mother   . Depression Mother   . Arthritis Sister        x2  . Lung cancer Sister        Deceased  . Cancer Other        Paternal & Maternal Aunts  . Heart attack Maternal Grandmother   . Neuropathy Sister   . Heart disease Brother        x1  . Alcoholism Brother        x2  . Colon cancer Neg Hx     Social History   Socioeconomic History  . Marital status: Single    Spouse name: Not on file  . Number of children: Not on file  . Years of education: Not on file  . Highest education level: Not on file  Occupational History  . Not on file  Social Needs  . Financial resource strain:  Not on file  . Food insecurity:    Worry: Not on file    Inability: Not on file  . Transportation needs:    Medical: Not on file    Non-medical: Not on file  Tobacco Use  . Smoking status: Never Smoker  . Smokeless tobacco: Never Used  Substance and Sexual Activity  . Alcohol use: No  . Drug use: No  . Sexual activity: Not on file  Lifestyle  . Physical activity:    Days per week: Not on file    Minutes per session: Not on file  . Stress: Not on file  Relationships  . Social connections:    Talks on phone: Not on file    Gets together: Not on file    Attends religious service: Not on file    Active member of club or organization: Not on file    Attends meetings of clubs or organizations: Not on file    Relationship status: Not on file  . Intimate partner violence:    Fear of current or ex partner: Not on file    Emotionally abused: Not on file    Physically abused: Not on file    Forced sexual activity: Not on file  Other Topics Concern  . Not on file  Social History Narrative  . Not on file    Review of Systems  Constitutional: Negative.   HENT: Negative.   Respiratory: Positive for apnea.   Cardiovascular: Negative.   Gastrointestinal: Negative.   Psychiatric/Behavioral: Positive for sleep disturbance.  All other systems reviewed and are negative.   Vitals:   09/28/18 1600  BP: 118/78  Pulse: 91  SpO2: 99%     Physical Exam  Constitutional: She appears well-developed and well-nourished.  HENT:  Head: Normocephalic and atraumatic.  Mallampati 3  Eyes: Pupils are equal, round, and reactive to light. Conjunctivae are normal.  Neck: Normal range of motion. Neck supple. No tracheal deviation present. No thyromegaly present.  Cardiovascular: Normal rate, regular rhythm and normal heart sounds.  Pulmonary/Chest: Effort normal and breath sounds normal. No respiratory distress. She has no wheezes.  Abdominal: Soft. Bowel sounds are normal. She exhibits no  distension. There is no tenderness.   Sleep study titration did reveal pressure requirement of 18  Assessment:  Obstructive sleep apnea Intolerant of current CPAP pressures We will try and adjust pressures  Try to assist patient with CPAP supplies  Plan/Recommendations: Reduce pressure to 15  Follow-up with downloads from the machine to assess efficiency  Encouraged to continue working on weight loss and regular exercises  I will see her back in the office in about 3 months Call with any significant concerns   Virl DiamondAdewale Olalere MD [facility] 09/28/2018, 4:21 PM  CC: Marcine MatarJohnson, Deborah B, MD

## 2018-09-30 ENCOUNTER — Encounter (HOSPITAL_COMMUNITY): Payer: Self-pay | Admitting: Psychiatry

## 2018-09-30 ENCOUNTER — Other Ambulatory Visit: Payer: Self-pay

## 2018-09-30 ENCOUNTER — Ambulatory Visit (INDEPENDENT_AMBULATORY_CARE_PROVIDER_SITE_OTHER): Payer: No Typology Code available for payment source | Admitting: Psychiatry

## 2018-09-30 DIAGNOSIS — F331 Major depressive disorder, recurrent, moderate: Secondary | ICD-10-CM

## 2018-09-30 DIAGNOSIS — F411 Generalized anxiety disorder: Secondary | ICD-10-CM

## 2018-09-30 MED ORDER — BUPROPION HCL ER (XL) 300 MG PO TB24: 300.0000 mg | ORAL_TABLET | Freq: Every day | ORAL | 2 refills | Status: DC

## 2018-09-30 NOTE — Progress Notes (Addendum)
BH MD/PA/NP OP Progress Note  09/30/2018 10:22 AM [name]  MRN:  [id number]  Chief Complaint: I am taking medication.  I see some improvement as I am less depressed and less anxious but I am still concerned about not losing weight.  HPI: Amy Perez came for her follow-up appointment.  On her last visit we started her on Wellbutrin because she was not happy with the weight gain which she believe due to Cymbalta.  She also tried Zoloft however after taking for 2 weeks she stopped.  She is concerned about her weight.  She was thinking that Wellbutrin will help weight loss but actually it did not help.  When I ask if she started exercise and watching her calorie intake she says she is going to start very soon.  I explained that medicine she is taking is to help the depression and anxiety and it is not for weight loss.  I encouraged again that she should watch her calorie intake and follow healthy lifestyle recommendations.  Otherwise she feels Wellbutrin is helping her depression.  She is less anxious and denies any major panic attack.  She had a good Thanksgiving when she visited her family who lives 2 hours away.  Patient lives with the boyfriend.  She has no children.  She denies drinking or using any illegal substances.  She started seeing therapy with Patton Salles and her next appointment is in few weeks.  She admitted not able to use CPAP machine because issues with the mask but her sleep is better and sometimes he takes trazodone.  She denies any paranoia, hallucination, suicidal thoughts or homicidal thought.  No delusion or psychosis.  Energy level is improved.  Visit Diagnosis:    ICD-10-CM   1. Moderate episode of recurrent major depressive disorder (HCC) F33.1 buPROPion (WELLBUTRIN XL) 300 MG 24 hr tablet  2. GAD (generalized anxiety disorder) F41.1 buPROPion (WELLBUTRIN XL) 300 MG 24 hr tablet    Past Psychiatric History: Reviewed. No history of psychiatric inpatient treatment or any  suicidal attempt. Took Cymbalta up to 60 mg but stopped due to weight gain.  She also tried Zoloft but after taking 2 dose she stopped due to feeling tired and dizzy.    Past Medical History:  Past Medical History:  Diagnosis Date  . AC (acromioclavicular) joint bone spurs   . Acid reflux   . Anxiety   . Blood dyscrasia   . CTS (carpal tunnel syndrome)    Bilateral  . Depression   . Gastric ulcer   . GERD (gastroesophageal reflux disease)   . Headache   . Heel spur   . History of chicken pox   . Hypertension   . Panic attacks   . Plantar fasciitis   . Sickle cell trait (HCC)   . Tendonitis    Feet    Past Surgical History:  Procedure Laterality Date  . CHOLECYSTECTOMY    . COLONOSCOPY WITH PROPOFOL N/A 01/09/2018   Procedure: COLONOSCOPY WITH PROPOFOL;  Surgeon: Corbin Ade, MD;  Location[facility];  Service: Endoscopy;  Laterality: N/A;  8:45am  . TUBAL LIGATION  08/29/04  . WISDOM TOOTH EXTRACTION      Family Psychiatric History: Reviewed.  Family History:  Family History  Problem Relation Age of Onset  . Hypertension Father 24       Deceased  . Heart attack Father   . Hypertension Mother        Living  . Arthritis Mother   .  Diabetes Mother   . Dementia Mother   . Depression Mother   . Arthritis Sister        x2  . Lung cancer Sister        Deceased  . Cancer Other        Paternal & Maternal Aunts  . Heart attack Maternal Grandmother   . Neuropathy Sister   . Bipolar disorder Sister   . Schizophrenia Sister   . Heart disease Brother        x1  . Alcohol abuse Brother   . Alcoholism Brother        x2  . Colon cancer Neg Hx     Social History:  Social History   Socioeconomic History  . Marital status: Single    Spouse name: Not on file  . Number of children: 0  . Years of education: Not on file  . Highest education level: Some college, no degree  Occupational History    Comment: not employed   Social Needs  . Financial resource  strain: Hard  . Food insecurity:    Worry: Often true    Inability: Often true  . Transportation needs:    Medical: Yes    Non-medical: Yes  Tobacco Use  . Smoking status: Never Smoker  . Smokeless tobacco: Never Used  Substance and Sexual Activity  . Alcohol use: No  . Drug use: No  . Sexual activity: Not Currently  Lifestyle  . Physical activity:    Days per week: 0 days    Minutes per session: 0 min  . Stress: Not on file  Relationships  . Social connections:    Talks on phone: Not on file    Gets together: Not on file    Attends religious service: More than 4 times per year    Active member of club or organization: No    Attends meetings of clubs or organizations: Never    Relationship status: Never married  Other Topics Concern  . Not on file  Social History Narrative  . Not on file    Allergies:  Allergies  Allergen Reactions  . Benadryl [Diphenhydramine Hcl] Itching  . Milk-Related Compounds Diarrhea  . Oxycodone-Acetaminophen Nausea And Vomiting  . Penicillins Nausea Only    Amoxicillin Has patient had a PCN reaction causing immediate rash, facial/tongue/throat swelling, SOB or lightheadedness with hypotension:__________ Has patient had a PCN reaction causing severe rash involving mucus membranes or skin necrosis:________ Has patient had a PCN reaction that required hospitalization:________ Has patient had a PCN reaction occurring within the last 10 years: ________ If all of the above answers are "NO", then may proceed with Cephalosporin use.     Metabolic Disorder Labs: Lab Results  Component Value Date   HGBA1C 5.4 04/24/2018   No results found for: PROLACTIN Lab Results  Component Value Date   CHOL 245 (H) 09/23/2017   TRIG 124 09/23/2017   HDL 51 09/23/2017   CHOLHDL 4.8 (H) 09/23/2017   LDLCALC 169 (H) 09/23/2017   Lab Results  Component Value Date   TSH 1.830 04/24/2018    Therapeutic Level Labs: No results found for: LITHIUM No  results found for: VALPROATE No components found for:  CBMZ  Current Medications: Current Outpatient Medications  Medication Sig Dispense Refill  . amLODipine (NORVASC) 10 MG tablet Take 1 tablet (10 mg total) by mouth daily. 30 tablet 6  . atorvastatin (LIPITOR) 10 MG tablet TAKE 1 TABLET BY MOUTH DAILY. 30 tablet 2  .  buPROPion (WELLBUTRIN XL) 150 MG 24 hr tablet Take 1 tablet (150 mg total) by mouth daily. 30 tablet 1  . diclofenac sodium (VOLTAREN) 1 % GEL Apply 2 g topically 4 (four) times daily. 100 g 0  . esomeprazole (NEXIUM) 40 MG capsule TAKE 1 CAPSULE BY MOUTH DAILY. 30 capsule 2  . fluticasone (FLONASE) 50 MCG/ACT nasal spray Place 2 sprays into both nostrils daily. (Patient taking differently: Place 2 sprays into both nostrils as needed. ) 16 g 0  . loratadine (CLARITIN) 10 MG tablet Take 1 tablet (10 mg total) by mouth daily. 30 tablet 2  . meloxicam (MOBIC) 15 MG tablet Take 1 tablet (15 mg total) by mouth daily. 30 tablet 5  . Multiple Vitamin (MULTIVITAMIN) tablet Take 1 tablet by mouth daily. Reported on 04/28/2016    . pantoprazole (PROTONIX) 40 MG tablet TAKE 1 TABLET BY MOUTH DAILY. 30 tablet 2  . PANTOPRAZOLE SODIUM PO   2  . polyethylene glycol powder (GLYCOLAX/MIRALAX) powder   5  . traZODone (DESYREL) 50 MG tablet Take 1-2 tab at bed time 30 tablet 0   No current facility-administered medications for this visit.      Musculoskeletal: Strength & Muscle Tone: within normal limits Gait & Station: normal Patient leans: N/A  Psychiatric Specialty Exam: ROS  Blood pressure 129/84, pulse 87, temperature 98.5 F (36.9 C), temperature source Oral, weight 294 lb 6.4 oz (133.5 kg), last menstrual period 12/14/2017.Body mass index is 48.99 kg/m.  General Appearance: Casual  Eye Contact:  Good  Speech:  Clear and Coherent  Volume:  Normal  Mood:  Dysphoric  Affect:  Appropriate  Thought Process:  Goal Directed  Orientation:  Full (Time, Place, and Person)  Thought  Content: Logical   Suicidal Thoughts:  No  Homicidal Thoughts:  No  Memory:  Immediate;   Good Recent;   Good Remote;   Good  Judgement:  Fair  Insight:  Fair  Psychomotor Activity:  Normal  Concentration:  Concentration: Fair and Attention Span: Fair  Recall:  Good  Fund of Knowledge: Good  Language: Good  Akathisia:  No  Handed:  Right  AIMS (if indicated): not done  Assets:  Communication Skills Desire for Improvement Housing Resilience  ADL's:  Intact  Cognition: WNL  Sleep:  Good   Screenings: GAD-7     Office Visit from 04/24/2018 in [facility] Office Visit from 12/23/2017 in [facility] Office Visit from 11/08/2017 in [facility] Office Visit from 09/23/2017 in [facility] Office Visit from 04/21/2017 in [facility]  Total GAD-7 Score  14  14  12  14  18     PHQ2-9     Office Visit from 04/24/2018 in [facility] Office Visit from 12/23/2017 in [facility] Office Visit from 11/08/2017 in [facility] Office Visit from 09/23/2017 in [facility] Office Visit from 04/21/2017 in [facility]  PHQ-2 Total Score  4  4  2  4  1   PHQ-9 Total Score  18  18  16  18   -       Assessment and Plan: Major depressive disorder, recurrent.,  Generalized anxiety disorder.  Encourage to watch her calorie intake and do regular exercise for weight loss.  Explained medicine is to help  her anxiety and depression and may not cause weight loss.  Wellbutrin helping her mood and anxiety.  Recommended to try Wellbutrin XL 300 mg daily to help her residual mood lability and anxiety.  Encouraged to continue therapy with Patton Salles.  Recommended to call us back if is any question or any concern.  I will see her again in 3  months.     Cleotis Nipper, MD 09/30/2018, 10:22 AM

## 2018-10-05 ENCOUNTER — Telehealth: Payer: Self-pay | Admitting: Pulmonary Disease

## 2018-10-05 MED FILL — AMLODIPINE BESYLATE 10 MG T: 10 | 30 days supply | Qty: 30 | Fill #2

## 2018-10-05 MED FILL — BUPROPION HCL XL 300 MG TAB: 300 | 30 days supply | Qty: 30 | Fill #0

## 2018-10-05 NOTE — Telephone Encounter (Signed)
lmtcb

## 2018-10-06 NOTE — Telephone Encounter (Signed)
Spoke with patient-states she was speaking to AO about her pressures being set differently and states she [facility] app[facility] with [facility] 10[facility]11-[facility] to have this done; wants to ensure that her pressure will be changed as well. Pt aware to have [facility] contact me if any questions or concerns. Nothing more needed at this time.

## 2018-10-06 NOTE — Telephone Encounter (Signed)
LMTCB

## 2018-10-07 ENCOUNTER — Encounter (HOSPITAL_COMMUNITY): Payer: Self-pay

## 2018-10-07 ENCOUNTER — Emergency Department (HOSPITAL_COMMUNITY)
Admission: EM | Admit: 2018-10-07 | Discharge: 2018-10-07 | Disposition: A | Discharge status: Home / Self Care | Payer: Self-pay | Attending: Emergency Medicine | Admitting: Emergency Medicine

## 2018-10-07 ENCOUNTER — Other Ambulatory Visit: Payer: Self-pay

## 2018-10-07 DIAGNOSIS — R0981 Nasal congestion: Secondary | ICD-10-CM | POA: Insufficient documentation

## 2018-10-07 MED ORDER — GUAIFENESIN 200 MG PO TABS: 200.0000 mg | ORAL_TABLET | ORAL | 0 refills | Status: DC | PRN

## 2018-10-07 MED ORDER — CETIRIZINE-PSEUDOEPHEDRINE ER 5-120 MG PO TB12: 1.0000 | ORAL_TABLET | Freq: Every day | ORAL | 0 refills | Status: DC

## 2018-10-07 NOTE — ED Triage Notes (Signed)
Patient c/o nasal congestion and sore throat x 3 days

## 2018-10-07 NOTE — ED Provider Notes (Signed)
[facility]-EMERGENCY DEPT Provider Note   CSN: [id number] Arrival date & time: 10/07/18  0707     History   Chief Complaint Chief Complaint  Patient presents with  . Nasal Congestion  . Sore Throat    HPI Amy Perez is a 52 y.o. female.  The history is provided by the patient. No language interpreter was used.  Sore Throat      52 year old female with history of GERD, depression, headache, hypertension presenting with sinus congestion.  For the past 3 to 4 days patient report having sneezing, congestion, postnasal drip, throat irritation, mild headache and chest congestion.  She denies any associated fever or chills, no chest pain or shortness of breath, no nausea vomiting diarrhea or rash.  She tries over-the-counter medication without adequate relief.  She mention normally developing these kind of symptoms yearly around the same time.  She denies any recent sick contact.  Past Medical History:  Diagnosis Date  . AC (acromioclavicular) joint bone spurs   . Acid reflux   . Anxiety   . Blood dyscrasia   . CTS (carpal tunnel syndrome)    Bilateral  . Depression   . Gastric ulcer   . GERD (gastroesophageal reflux disease)   . Headache   . Heel spur   . History of chicken pox   . Hypertension   . Panic attacks   . Plantar fasciitis   . Sickle cell trait (HCC)   . Tendonitis    Feet    Patient Active Problem List   Diagnosis Date Noted  . OSA (obstructive sleep apnea) 07/27/2018  . Hypomagnesemia 03/27/2018  . Constipation 12/08/2017  . Taking multiple medications for chronic disease 12/08/2017  . Primary osteoarthritis of right knee 04/21/2017  . Acute bacterial sinusitis 11/07/2016  . Insomnia 07/02/2016  . Carpal tunnel syndrome 06/29/2016  . Gastroesophageal reflux disease without esophagitis 04/26/2016  . Essential hypertension 04/26/2016  . Generalized anxiety disorder 04/26/2016  . OBESITY, MORBID 03/01/2007    Past  Surgical History:  Procedure Laterality Date  . CHOLECYSTECTOMY    . COLONOSCOPY WITH PROPOFOL N/A 01/09/2018   Procedure: COLONOSCOPY WITH PROPOFOL;  Surgeon: Corbin Adeourk, Robert M, MD;  Location: [facility];  Service: Endoscopy;  Laterality: N/A;  8:45am  . TUBAL LIGATION  08/29/04  . WISDOM TOOTH EXTRACTION       OB History   No obstetric history on file.      Home Medications    Prior to Admission medications   Medication Sig Start Date End Date Taking? Authorizing Provider  amLODipine (NORVASC) 10 MG tablet Take 1 tablet (10 mg total) by mouth daily. 07/27/18   Marcine MatarJohnson, Deborah B, MD  atorvastatin (LIPITOR) 10 MG tablet TAKE 1 TABLET BY MOUTH DAILY. 09/25/18   Marcine MatarJohnson, Deborah B, MD  buPROPion (WELLBUTRIN XL) 300 MG 24 hr tablet Take 1 tablet (300 mg total) by mouth daily. 09/30/18   Arfeen, Phillips GroutSyed T, MD  diclofenac sodium (VOLTAREN) 1 % GEL Apply 2 g topically 4 (four) times daily. 06/06/18   Caccavale, Sophia, PA-C  esomeprazole (NEXIUM) 40 MG capsule TAKE 1 CAPSULE BY MOUTH DAILY. 10/28/17   Marcine MatarJohnson, Deborah B, MD  fluticasone (FLONASE) 50 MCG/ACT nasal spray Place 2 sprays into both nostrils daily. Patient taking differently: Place 2 sprays into both nostrils as needed.  11/17/16   Waldon MerlMartin, William C, PA-C  loratadine (CLARITIN) 10 MG tablet Take 1 tablet (10 mg total) by mouth daily. 11/08/17   Jonah BlueJohnson, Deborah  B, MD  meloxicam (MOBIC) 15 MG tablet Take 1 tablet (15 mg total) by mouth daily. 04/24/18   Marcine Matar, MD  Multiple Vitamin (MULTIVITAMIN) tablet Take 1 tablet by mouth daily. Reported on 04/28/2016    [provider]  pantoprazole (PROTONIX) 40 MG tablet TAKE 1 TABLET BY MOUTH DAILY. 07/24/18   Marcine Matar, MD  PANTOPRAZOLE SODIUM PO  09/25/18   [provider]  polyethylene glycol powder (GLYCOLAX/MIRALAX) powder  08/24/18   [provider]  traZODone (DESYREL) 50 MG tablet Take 1-2 tab at bed time 08/17/18   Arfeen, Phillips Grout, MD    Family  History Family History  Problem Relation Age of Onset  . Hypertension Father 25       Deceased  . Heart attack Father   . Hypertension Mother        Living  . Arthritis Mother   . Diabetes Mother   . Dementia Mother   . Depression Mother   . Arthritis Sister        x2  . Lung cancer Sister        Deceased  . Cancer Other        Paternal & Maternal Aunts  . Heart attack Maternal Grandmother   . Neuropathy Sister   . Bipolar disorder Sister   . Schizophrenia Sister   . Heart disease Brother        x1  . Alcohol abuse Brother   . Alcoholism Brother        x2  . Colon cancer Neg Hx     Social History Social History   Tobacco Use  . Smoking status: Never Smoker  . Smokeless tobacco: Never Used  Substance Use Topics  . Alcohol use: No  . Drug use: No     Allergies   Benadryl [diphenhydramine hcl]; Milk-related compounds; Oxycodone-acetaminophen; and Penicillins   Review of Systems Review of Systems  All other systems reviewed and are negative.    Physical Exam Updated Vital Signs BP (!) 146/92 (BP Location: Left Arm)   Pulse 94   Temp 100 F (37.8 C) (Oral)   Resp 18   Ht 5\' 5"  (1.651 m)   Wt 129.3 kg   LMP 12/14/2017   SpO2 98%   BMI 47.43 kg/m   Physical Exam Vitals signs and nursing note reviewed.  Constitutional:      General: She is not in acute distress.    Appearance: She is well-developed.  HENT:     Head: Atraumatic.     Comments: Ears: Right TM is normal, left TM is obscured by cerumen impaction Nose: Rhinorrhea Throat: Uvula midline no tonsillar enlargement or exudates, no trismus No sinus tenderness on percussion. Eyes:     Conjunctiva/sclera: Conjunctivae normal.  Neck:     Musculoskeletal: Neck supple.  Cardiovascular:     Rate and Rhythm: Normal rate and regular rhythm.     Pulses: Normal pulses.     Heart sounds: Normal heart sounds.  Pulmonary:     Effort: Pulmonary effort is normal.     Breath sounds: Normal breath  sounds.  Abdominal:     Palpations: Abdomen is soft.     Tenderness: There is no abdominal tenderness.  Skin:    Findings: No rash.  Neurological:     Mental Status: She is alert.      ED Treatments / Results  Labs (all labs ordered are listed, but only abnormal results are displayed) Labs Reviewed -  No data to display  EKG None  Radiology No results found.  Procedures Procedures (including critical care time)  Medications Ordered in ED Medications - No data to display   Initial Impression / Assessment and Plan / ED Course  I have reviewed the triage vital signs and the nursing notes.  Pertinent labs & imaging results that were available during my care of the patient were reviewed by me and considered in my medical decision making (see chart for details).     BP (!) 146/92 (BP Location: Left Arm)   Pulse 94   Temp 100 F (37.8 C) (Oral)   Resp 18   Ht 5\' 5"  (1.651 m)   Wt 129.3 kg   LMP 12/14/2017   SpO2 98%   BMI 47.43 kg/m    Final Clinical Impressions(s) / ED Diagnoses   Final diagnoses:  Sinus congestion    ED Discharge Orders         Ordered    guaiFENesin 200 MG tablet  Every 4 hours PRN     10/07/18 0816    cetirizine-pseudoephedrine (ZYRTEC-D) 5-120 MG tablet  Daily     10/07/18 0816         8:14 AM Patient here with cold symptoms as well as congestion.  She also endorsing sneezing.  This is likely viral in etiology versus allergies.  Will discharge home with Zyrtec as patient is currently on Flonase.  Guaifenesin for congestion.  Return precautions discussed.  Antibiotic is not indicated at this time.   Fayrene Helper, PA-C 10/07/18 1610    Margarita Grizzle, MD 10/08/18 [phone]

## 2018-10-10 ENCOUNTER — Other Ambulatory Visit (HOSPITAL_BASED_OUTPATIENT_CLINIC_OR_DEPARTMENT_OTHER): Payer: Self-pay

## 2018-10-11 ENCOUNTER — Ambulatory Visit (HOSPITAL_BASED_OUTPATIENT_CLINIC_OR_DEPARTMENT_OTHER): Payer: No Typology Code available for payment source | Attending: Pulmonary Disease | Admitting: Radiology

## 2018-10-11 ENCOUNTER — Encounter: Payer: Self-pay | Admitting: Internal Medicine

## 2018-10-11 ENCOUNTER — Telehealth: Payer: Self-pay | Admitting: Internal Medicine

## 2018-10-11 ENCOUNTER — Ambulatory Visit: Payer: Self-pay | Admitting: Nurse Practitioner

## 2018-10-11 DIAGNOSIS — G4733 Obstructive sleep apnea (adult) (pediatric): Secondary | ICD-10-CM

## 2018-10-11 NOTE — Progress Notes (Deleted)
Referring Provider: Marcine Matar, MD Primary Care Physician:  Marcine Matar, MD Primary GI:  Dr.   Bonnetta Barry chief complaint on file.   HPI:   Amy Perez is a 52 y.o. female who presents for follow-up on constipation.  Patient was last seen in our office 03/27/2018 for the same as well as GERD and low magnesium.  Previously recommended Linzess 72 mcg for constipation.  Prescription was sent to the pharmacy.  Colonoscopy completed 01/09/2018 which found a normal colon recommended 10-year repeat in 2029.  It was recommended she continue potassium supplementation due to hypokalemia which did improve from 2.4-3.3.  Also recommended 400 mg twice daily magnesium due to serum magnesium low at 1.6.  Recheck found stable potassium at 3.3.  Primary care had taken over monitoring her electrolytes.  Further improvements of potassium noted at 3.5 after increase potassium dose.  At some point she called our office asking about Linzess as she felt this was causing her low potassium and magnesium.  Most likely culprit deemed HCTZ and recommended trial of Amitiza 8 mg twice a day with samples left at the front porch for her to pick up.  She further called our office asking if she should stop her magnesium supplement and at that time noted some worsening constipation because she was not taking anything because she feels all constipation medicines affect her potassium level.  She also was requesting a "test or scan" of her stomach.  Follow-up office visit she was not on anything for constipation, tried Amitiza 8 mcg did not help.  She then stated Linzess caused bloating and diarrhea/noted only having a bowel movement twice a week, fiber supplement helping, abdominal pain which improves after a bowel movement.  Has GERD on Nexium and has breakthrough symptoms 3 times a month which Zantac previously helped, although she did not have any on hand.  No other GI symptoms.  Recommended follow-up labs, prescription  for Zantac sent to her pharmacy, try Trulance 3 mg daily and request progress report in 1 to 2 weeks.  Follow-up in 3 months.  She called again to ask about if she should continue taking magnesium.  She canceled her follow-up appointment.  She stated she was never notified of her lab results although it was confirmed that she was.  At that time she noted to be taking x-lax and MiraLAX about 4 times a week and she was told she could take it daily if needed.  She was requesting something different.  She again asked to have [place] further check her magnesium level stating that she was started on magnesium when her levels were high and she is concerned about it.  We recommended Amitiza 24 mcg versus Trulance and she stated she was worried that those might raise her blood pressure.  We told her she was started on magnesium because her magnesium level was low, not high.  We reluctantly put in an order to recheck her magnesium.  We assured her that neither constipation medication we are recommending had a known side effect of hypertension.  She agreed to try Trulance.  Patient again called our office 07/05/2018 asking about magnesium, having her blood work done and figuring out "what is wrong with my body."  Both of our nurses have already spoken to her about this.  Incidentally, her magnesium level was normal at 1.9.  She was informed her magnesium levels perfectly normal and she should follow-up with her primary care for further testing and  supplement of her magnesium.  Today she states   Past Medical History:  Diagnosis Date  . AC (acromioclavicular) joint bone spurs   . Acid reflux   . Anxiety   . Blood dyscrasia   . CTS (carpal tunnel syndrome)    Bilateral  . Depression   . Gastric ulcer   . GERD (gastroesophageal reflux disease)   . Headache   . Heel spur   . History of chicken pox   . Hypertension   . Panic attacks   . Plantar fasciitis   . Sickle cell trait (HCC)   . Tendonitis    Feet     Past Surgical History:  Procedure Laterality Date  . CHOLECYSTECTOMY    . COLONOSCOPY WITH PROPOFOL N/A 01/09/2018   Procedure: COLONOSCOPY WITH PROPOFOL;  Surgeon: Corbin Adeourk, Robert M, MD;  Location: [facility];  Service: Endoscopy;  Laterality: N/A;  8:45am  . TUBAL LIGATION  08/29/04  . WISDOM TOOTH EXTRACTION      Current Outpatient Medications  Medication Sig Dispense Refill  . amLODipine (NORVASC) 10 MG tablet Take 1 tablet (10 mg total) by mouth daily. 30 tablet 6  . atorvastatin (LIPITOR) 10 MG tablet TAKE 1 TABLET BY MOUTH DAILY. 30 tablet 2  . buPROPion (WELLBUTRIN XL) 300 MG 24 hr tablet Take 1 tablet (300 mg total) by mouth daily. 30 tablet 2  . cetirizine-pseudoephedrine (ZYRTEC-D) 5-120 MG tablet Take 1 tablet by mouth daily. 30 tablet 0  . diclofenac sodium (VOLTAREN) 1 % GEL Apply 2 g topically 4 (four) times daily. 100 g 0  . esomeprazole (NEXIUM) 40 MG capsule TAKE 1 CAPSULE BY MOUTH DAILY. 30 capsule 2  . fluticasone (FLONASE) 50 MCG/ACT nasal spray Place 2 sprays into both nostrils daily. (Patient taking differently: Place 2 sprays into both nostrils as needed. ) 16 g 0  . guaiFENesin 200 MG tablet Take 1 tablet (200 mg total) by mouth every 4 (four) hours as needed for cough or to loosen phlegm. 30 suppository 0  . loratadine (CLARITIN) 10 MG tablet Take 1 tablet (10 mg total) by mouth daily. 30 tablet 2  . meloxicam (MOBIC) 15 MG tablet Take 1 tablet (15 mg total) by mouth daily. 30 tablet 5  . Multiple Vitamin (MULTIVITAMIN) tablet Take 1 tablet by mouth daily. Reported on 04/28/2016    . pantoprazole (PROTONIX) 40 MG tablet TAKE 1 TABLET BY MOUTH DAILY. 30 tablet 2  . PANTOPRAZOLE SODIUM PO   2  . polyethylene glycol powder (GLYCOLAX/MIRALAX) powder   5  . traZODone (DESYREL) 50 MG tablet Take 1-2 tab at bed time 30 tablet 0   No current facility-administered medications for this visit.     Allergies as of 10/11/2018 - Review Complete 10/07/2018  Allergen  Reaction Noted  . Benadryl [diphenhydramine hcl] Itching 11/06/2012  . Milk-related compounds Diarrhea 09/22/2016  . Oxycodone-acetaminophen Nausea And Vomiting 02/27/2007  . Penicillins Nausea Only 02/27/2007    Family History  Problem Relation Age of Onset  . Hypertension Father [id number]       Deceased  . Heart attack Father   . Hypertension Mother        Living  . Arthritis Mother   . Diabetes Mother   . Dementia Mother   . Depression Mother   . Arthritis Sister        x2  . Lung cancer Sister        Deceased  . Cancer Other  Paternal & Maternal Aunts  . Heart attack Maternal Grandmother   . Neuropathy Sister   . Bipolar disorder Sister   . Schizophrenia Sister   . Heart disease Brother        x1  . Alcohol abuse Brother   . Alcoholism Brother        x2  . Colon cancer Neg Hx     Social History   Socioeconomic History  . Marital status: Single    Spouse name: Not on file  . Number of children: 0  . Years of education: Not on file  . Highest education level: Some college, no degree  Occupational History    Comment: not employed   Social Needs  . Financial resource strain: Hard  . Food insecurity:    Worry: Often true    Inability: Often true  . Transportation needs:    Medical: Yes    Non-medical: Yes  Tobacco Use  . Smoking status: Never Smoker  . Smokeless tobacco: Never Used  Substance and Sexual Activity  . Alcohol use: No  . Drug use: No  . Sexual activity: Not Currently  Lifestyle  . Physical activity:    Days per week: 0 days    Minutes per session: 0 min  . Stress: Not on file  Relationships  . Social connections:    Talks on phone: Not on file    Gets together: Not on file    Attends religious service: More than 4 times per year    Active member of club or organization: No    Attends meetings of clubs or organizations: Never    Relationship status: Never married  Other Topics Concern  . Not on file  Social History Narrative  .  Not on file    Review of Systems: General: Negative for anorexia, weight loss, fever, chills, fatigue, weakness. Eyes: Negative for vision changes.  ENT: Negative for hoarseness, difficulty swallowing , nasal congestion. CV: Negative for chest pain, angina, palpitations, dyspnea on exertion, peripheral edema.  Respiratory: Negative for dyspnea at rest, dyspnea on exertion, cough, sputum, wheezing.  GI: See history of present illness. GU:  Negative for dysuria, hematuria, urinary incontinence, urinary frequency, nocturnal urination.  MS: Negative for joint pain, low back pain.  Derm: Negative for rash or itching.  Neuro: Negative for weakness, abnormal sensation, seizure, frequent headaches, memory loss, confusion.  Psych: Negative for anxiety, depression, suicidal ideation, hallucinations.  Endo: Negative for unusual weight change.  Heme: Negative for bruising or bleeding. Allergy: Negative for rash or hives.   Physical Exam: LMP 12/14/2017  General:   Alert and oriented. Pleasant and cooperative. Well-nourished and well-developed.  Head:  Normocephalic and atraumatic. Eyes:  Without icterus, sclera clear and conjunctiva pink.  Ears:  Normal auditory acuity. Mouth:  No deformity or lesions, oral mucosa pink.  Throat/Neck:  Supple, without mass or thyromegaly. Cardiovascular:  S1, S2 present without murmurs appreciated. Normal pulses noted. Extremities without clubbing or edema. Respiratory:  Clear to auscultation bilaterally. No wheezes, rales, or rhonchi. No distress.  Gastrointestinal:  +BS, soft, non-tender and non-distended. No HSM noted. No guarding or rebound. No masses appreciated.  Rectal:  Deferred  Musculoskalatal:  Symmetrical without gross deformities. Normal posture. Skin:  Intact without significant lesions or rashes. Neurologic:  Alert and oriented x4;  grossly normal neurologically. Psych:  Alert and cooperative. Normal mood and affect. Heme/Lymph/Immune: No  significant cervical adenopathy. No excessive bruising noted.    10/11/2018 8:16 AM   Disclaimer:  This note was dictated with voice recognition software. Similar sounding words can inadvertently be transcribed and may not be corrected upon review.

## 2018-10-11 NOTE — Telephone Encounter (Signed)
PATIENT WAS A NO SHOW AND LETTER SENT  °

## 2018-10-12 ENCOUNTER — Ambulatory Visit (INDEPENDENT_AMBULATORY_CARE_PROVIDER_SITE_OTHER): Payer: Self-pay | Admitting: Physical Medicine and Rehabilitation

## 2018-10-12 ENCOUNTER — Telehealth (INDEPENDENT_AMBULATORY_CARE_PROVIDER_SITE_OTHER): Payer: Self-pay | Admitting: Orthopaedic Surgery

## 2018-10-12 ENCOUNTER — Encounter (INDEPENDENT_AMBULATORY_CARE_PROVIDER_SITE_OTHER): Payer: Self-pay | Admitting: Physical Medicine and Rehabilitation

## 2018-10-12 DIAGNOSIS — R202 Paresthesia of skin: Secondary | ICD-10-CM

## 2018-10-12 NOTE — Telephone Encounter (Signed)
Patient came into the clinic for an appointment with Dr. Alvester MorinNewton and requested a new handicap placard.  CB#[phone].  Thank you.

## 2018-10-12 NOTE — Procedures (Signed)
EMG & NCV Findings: Evaluation of the right median motor nerve showed prolonged distal onset latency (6.0 ms) and decreased conduction velocity (Elbow-Wrist, 48 m/s).  The left median (across palm) sensory nerve showed prolonged distal peak latency (Wrist, 3.9 ms).  The right median (across palm) sensory nerve showed no response (Palm) and prolonged distal peak latency (6.7 ms).  All remaining nerves (as indicated in the following tables) were within normal limits.  Left vs. Right side comparison data for the median motor nerve indicates abnormal L-R latency difference (2.2 ms) and abnormal L-R velocity difference (Elbow-Wrist, 11 m/s).  The ulnar motor nerve indicates abnormal L-R velocity difference (A Elbow-B Elbow, 33 m/s).  The ulnar sensory nerve indicates abnormal L-R amplitude difference (82.5 %).    All examined muscles (as indicated in the following table) showed no evidence of electrical instability.    Impression: The above electrodiagnostic study is ABNORMAL and reveals evidence of:  1.   A moderate right median nerve entrapment at the wrist (carpal tunnel syndrome) affecting sensory and motor components.   2.   A  mild left median nerve entrapment at the wrist (carpal tunnel syndrome) affecting sensory components.  There is no significant electrodiagnostic evidence of any other focal nerve entrapment, brachial plexopathy or generalized peripheral neuropathy.  As you know, this particular electrodiagnostic study cannot rule out chemical radiculitis or sensory only radiculopathy.  Recommendations: 1.  Follow-up with referring physician. 2.  Continue current management of symptoms. 3.  Continue use of resting splint at night-time and as needed during the day. 4.  Suggest surgical evaluation.  ___________________________ Naaman PlummerFred Jeryl Wilbourn FAAPMR Board Certified, American Board of Physical Medicine and Rehabilitation    Nerve Conduction Studies Anti Sensory Summary Table   Stim Site  NR Peak (ms) Norm Peak (ms) P-T Amp (V) Norm P-T Amp Site1 Site2 Delta-P (ms) Dist (cm) Vel (m/s) Norm Vel (m/s)  Left Median Acr Palm Anti Sensory (2nd Digit)  32C  Wrist    *3.9 <3.6 21.9 >10 Wrist Palm 2.1 0.0    Palm    1.8 <2.0 20.2         Right Median Acr Palm Anti Sensory (2nd Digit)  31.6C  Wrist    *6.7 <3.6 10.5 >10 Wrist Palm  0.0    Palm *NR  <2.0          Left Radial Anti Sensory (Base 1st Digit)  32C  Wrist    1.9 <3.1 19.5  Wrist Base 1st Digit 1.9 0.0    Right Radial Anti Sensory (Base 1st Digit)  31.9C  Wrist    2.0 <3.1 30.5  Wrist Base 1st Digit 2.0 0.0    Left Ulnar Anti Sensory (5th Digit)  32.1C  Wrist    3.0 <3.7 17.7 >15.0 Wrist 5th Digit 3.0 14.0 47 >38  Right Ulnar Anti Sensory (5th Digit)  32C  Wrist    3.2 <3.7 101.1 >15.0 Wrist 5th Digit 3.2 14.0 44 >38   Motor Summary Table   Stim Site NR Onset (ms) Norm Onset (ms) O-P Amp (mV) Norm O-P Amp Site1 Site2 Delta-0 (ms) Dist (cm) Vel (m/s) Norm Vel (m/s)  Left Median Motor (Abd Poll Brev)  32.1C  Wrist    3.8 <4.2 9.4 >5 Elbow Wrist 3.5 20.5 59 >50  Elbow    7.3  8.9         Right Median Motor (Abd Poll Brev)  31.7C  Wrist    *6.0 <4.2 9.7 >5 Elbow Wrist  4.2 20.0 *48 >50  Elbow    10.2  7.2         Left Ulnar Motor (Abd Dig Min)  32.2C  Wrist    2.7 <4.2 8.8 >3 B Elbow Wrist 3.2 20.0 63 >53  B Elbow    5.9  8.6  A Elbow B Elbow 1.0 11.0 110 >53  A Elbow    6.9  8.4         Right Ulnar Motor (Abd Dig Min)  31.6C  Wrist    2.7 <4.2 9.9 >3 B Elbow Wrist 3.3 20.5 62 >53  B Elbow    6.0  10.9  A Elbow B Elbow 1.3 10.0 77 >53  A Elbow    7.3  10.5          EMG   Side Muscle Nerve Root Ins Act Fibs Psw Amp Dur Poly Recrt Int Dennie BiblePat Comment  Right Abd Poll Brev Median C8-T1 Nml Nml Nml Nml Nml 0 Nml Nml   Right 1stDorInt Ulnar C8-T1 Nml Nml Nml Nml Nml 0 Nml Nml     Nerve Conduction Studies Anti Sensory Left/Right Comparison   Stim Site L Lat (ms) R Lat (ms) L-R Lat (ms) L Amp (V) R Amp (V) L-R  Amp (%) Site1 Site2 L Vel (m/s) R Vel (m/s) L-R Vel (m/s)  Median Acr Palm Anti Sensory (2nd Digit)  32C  Wrist *3.9 *6.7 2.8 21.9 10.5 52.1 Wrist Palm     Palm 1.8   20.2         Radial Anti Sensory (Base 1st Digit)  32C  Wrist 1.9 2.0 0.1 19.5 30.5 36.1 Wrist Base 1st Digit     Ulnar Anti Sensory (5th Digit)  32.1C  Wrist 3.0 3.2 0.2 17.7 101.1 *82.5 Wrist 5th Digit 47 44 3   Motor Left/Right Comparison   Stim Site L Lat (ms) R Lat (ms) L-R Lat (ms) L Amp (mV) R Amp (mV) L-R Amp (%) Site1 Site2 L Vel (m/s) R Vel (m/s) L-R Vel (m/s)  Median Motor (Abd Poll Brev)  32.1C  Wrist 3.8 *6.0 *2.2 9.4 9.7 3.1 Elbow Wrist 59 *48 *11  Elbow 7.3 10.2 2.9 8.9 7.2 19.1       Ulnar Motor (Abd Dig Min)  32.2C  Wrist 2.7 2.7 0.0 8.8 9.9 11.1 B Elbow Wrist 63 62 1  B Elbow 5.9 6.0 0.1 8.6 10.9 21.1 A Elbow B Elbow 110 77 *33  A Elbow 6.9 7.3 0.4 8.4 10.5 20.0          Waveforms:

## 2018-10-12 NOTE — Progress Notes (Signed)
Amy BostonMichelle E Hevener - 52 y.o. female MRN [id number]  Date of birth: 09-26-1966  Office Visit Note: Visit Date: 10/12/2018 PCP: Marcine MatarJohnson, Deborah B, MD Referred by: Marcine MatarJohnson, Deborah B, MD  Subjective: Chief Complaint  Patient presents with  . Right Hand - Pain, Numbness, Tingling  . Left Hand - Pain, Numbness, Tingling   HPI: Amy Perez is a 52 y.o. female who comes in today At the request of Rexene EdisonGil Clark, PA-C for electrodiagnostic study of both upper limbs.  Patient has been having years of hand pain with numbness but worsening over the last many months.  She rates as a severe pain of 7 out of 10 with numbness and tingling in the right and left hand in all the fingers in a nondermatomal fashion.  She reports the right is worse than the left.  She has worsening symptoms laying down at night.  She does report wearing a brace does seem to help.  She has not had prior electrodiagnostic studies.  She has had no radicular pain down the arms.  No specific trauma.  She is not a diabetic.  ROS Otherwise per HPI.  Assessment & Plan: Visit Diagnoses:  1. Paresthesia of skin     Plan: Impression: The above electrodiagnostic study is ABNORMAL and reveals evidence of:  1.   A moderate right median nerve entrapment at the wrist (carpal tunnel syndrome) affecting sensory and motor components.   2.   A  mild left median nerve entrapment at the wrist (carpal tunnel syndrome) affecting sensory components.  There is no significant electrodiagnostic evidence of any other focal nerve entrapment, brachial plexopathy or generalized peripheral neuropathy.  As you know, this particular electrodiagnostic study cannot rule out chemical radiculitis or sensory only radiculopathy.  Recommendations: 1.  Follow-up with referring physician. 2.  Continue current management of symptoms. 3.  Continue use of resting splint at night-time and as needed during the day. 4.  Suggest surgical evaluation.    Meds &  Orders: No orders of the defined types were placed in this encounter.   Orders Placed This Encounter  Procedures  . NCV with EMG (electromyography)    Follow-up: Return for Rexene EdisonGil Clark, P.A.-C.   Procedures: No procedures performed  EMG & NCV Findings: Evaluation of the right median motor nerve showed prolonged distal onset latency (6.0 ms) and decreased conduction velocity (Elbow-Wrist, 48 m/s).  The left median (across palm) sensory nerve showed prolonged distal peak latency (Wrist, 3.9 ms).  The right median (across palm) sensory nerve showed no response (Palm) and prolonged distal peak latency (6.7 ms).  All remaining nerves (as indicated in the following tables) were within normal limits.  Left vs. Right side comparison data for the median motor nerve indicates abnormal L-R latency difference (2.2 ms) and abnormal L-R velocity difference (Elbow-Wrist, 11 m/s).  The ulnar motor nerve indicates abnormal L-R velocity difference (A Elbow-B Elbow, 33 m/s).  The ulnar sensory nerve indicates abnormal L-R amplitude difference (82.5 %).    All examined muscles (as indicated in the following table) showed no evidence of electrical instability.    Impression: The above electrodiagnostic study is ABNORMAL and reveals evidence of:  1.   A moderate right median nerve entrapment at the wrist (carpal tunnel syndrome) affecting sensory and motor components.   2.   A  mild left median nerve entrapment at the wrist (carpal tunnel syndrome) affecting sensory components.  There is no significant electrodiagnostic evidence of any other focal nerve entrapment,  brachial plexopathy or generalized peripheral neuropathy.  As you know, this particular electrodiagnostic study cannot rule out chemical radiculitis or sensory only radiculopathy.  Recommendations: 1.  Follow-up with referring physician. 2.  Continue current management of symptoms. 3.  Continue use of resting splint at night-time and as needed during  the day. 4.  Suggest surgical evaluation.  ___________________________ Naaman Plummer FAAPMR Board Certified, American Board of Physical Medicine and Rehabilitation    Nerve Conduction Studies Anti Sensory Summary Table   Stim Site NR Peak (ms) Norm Peak (ms) P-T Amp (V) Norm P-T Amp Site1 Site2 Delta-P (ms) Dist (cm) Vel (m/s) Norm Vel (m/s)  Left Median Acr Palm Anti Sensory (2nd Digit)  32C  Wrist    *3.9 <3.6 21.9 >10 Wrist Palm 2.1 0.0    Palm    1.8 <2.0 20.2         Right Median Acr Palm Anti Sensory (2nd Digit)  31.6C  Wrist    *6.7 <3.6 10.5 >10 Wrist Palm  0.0    Palm *NR  <2.0          Left Radial Anti Sensory (Base 1st Digit)  32C  Wrist    1.9 <3.1 19.5  Wrist Base 1st Digit 1.9 0.0    Right Radial Anti Sensory (Base 1st Digit)  31.9C  Wrist    2.0 <3.1 30.5  Wrist Base 1st Digit 2.0 0.0    Left Ulnar Anti Sensory (5th Digit)  32.1C  Wrist    3.0 <3.7 17.7 >15.0 Wrist 5th Digit 3.0 14.0 47 >38  Right Ulnar Anti Sensory (5th Digit)  32C  Wrist    3.2 <3.7 101.1 >15.0 Wrist 5th Digit 3.2 14.0 44 >38   Motor Summary Table   Stim Site NR Onset (ms) Norm Onset (ms) O-P Amp (mV) Norm O-P Amp Site1 Site2 Delta-0 (ms) Dist (cm) Vel (m/s) Norm Vel (m/s)  Left Median Motor (Abd Poll Brev)  32.1C  Wrist    3.8 <4.2 9.4 >5 Elbow Wrist 3.5 20.5 59 >50  Elbow    7.3  8.9         Right Median Motor (Abd Poll Brev)  31.7C  Wrist    *6.0 <4.2 9.7 >5 Elbow Wrist 4.2 20.0 *48 >50  Elbow    10.2  7.2         Left Ulnar Motor (Abd Dig Min)  32.2C  Wrist    2.7 <4.2 8.8 >3 B Elbow Wrist 3.2 20.0 63 >53  B Elbow    5.9  8.6  A Elbow B Elbow 1.0 11.0 110 >53  A Elbow    6.9  8.4         Right Ulnar Motor (Abd Dig Min)  31.6C  Wrist    2.7 <4.2 9.9 >3 B Elbow Wrist 3.3 20.5 62 >53  B Elbow    6.0  10.9  A Elbow B Elbow 1.3 10.0 77 >53  A Elbow    7.3  10.5          EMG   Side Muscle Nerve Root Ins Act Fibs Psw Amp Dur Poly Recrt Int Dennie Bible Comment  Right Abd Poll Brev Median  C8-T1 Nml Nml Nml Nml Nml 0 Nml Nml   Right 1stDorInt Ulnar C8-T1 Nml Nml Nml Nml Nml 0 Nml Nml     Nerve Conduction Studies Anti Sensory Left/Right Comparison   Stim Site L Lat (ms) R Lat (ms) L-R Lat (ms) L Amp (V) R  Amp (V) L-R Amp (%) Site1 Site2 L Vel (m/s) R Vel (m/s) L-R Vel (m/s)  Median Acr Palm Anti Sensory (2nd Digit)  32C  Wrist *3.9 *6.7 2.8 21.9 10.5 52.1 Wrist Palm     Palm 1.8   20.2         Radial Anti Sensory (Base 1st Digit)  32C  Wrist 1.9 2.0 0.1 19.5 30.5 36.1 Wrist Base 1st Digit     Ulnar Anti Sensory (5th Digit)  32.1C  Wrist 3.0 3.2 0.2 17.7 101.1 *82.5 Wrist 5th Digit 47 44 3   Motor Left/Right Comparison   Stim Site L Lat (ms) R Lat (ms) L-R Lat (ms) L Amp (mV) R Amp (mV) L-R Amp (%) Site1 Site2 L Vel (m/s) R Vel (m/s) L-R Vel (m/s)  Median Motor (Abd Poll Brev)  32.1C  Wrist 3.8 *6.0 *2.2 9.4 9.7 3.1 Elbow Wrist 59 *48 *11  Elbow 7.3 10.2 2.9 8.9 7.2 19.1       Ulnar Motor (Abd Dig Min)  32.2C  Wrist 2.7 2.7 0.0 8.8 9.9 11.1 B Elbow Wrist 63 62 1  B Elbow 5.9 6.0 0.1 8.6 10.9 21.1 A Elbow B Elbow 110 77 *33  A Elbow 6.9 7.3 0.4 8.4 10.5 20.0          Waveforms:                     Clinical History: No specialty comments available.   She reports that she has never smoked. She has never used smokeless tobacco.  Recent Labs    04/24/18 1647  HGBA1C 5.4    Objective:  VS:  HT:    WT:   BMI:     BP:   HR: bpm  TEMP: ( )  RESP:  Physical Exam Musculoskeletal:        General: No swelling, tenderness or deformity.     Comments: Inspection reveals no atrophy of the bilateral APB or FDI or hand intrinsics. There is no swelling, color changes, allodynia or dystrophic changes. There is 5 out of 5 strength in the bilateral wrist extension, finger abduction and long finger flexion. There is intact sensation to light touch in all dermatomal and peripheral nerve distributions.  There is a negative Hoffmann's test bilaterally.  Skin:     General: Skin is warm and dry.     Findings: No erythema or rash.  Neurological:     General: No focal deficit present.     Mental Status: She is alert and oriented to person, place, and time.     Motor: No weakness or abnormal muscle tone.     Coordination: Coordination normal.  Psychiatric:        Mood and Affect: Mood normal.        Behavior: Behavior normal.     Ortho Exam Imaging: No results found.  Past Medical/Family/Surgical/Social History: Medications & Allergies reviewed per EMR, new medications updated. Patient Active Problem List   Diagnosis Date Noted  . OSA (obstructive sleep apnea) 07/27/2018  . Hypomagnesemia 03/27/2018  . Constipation 12/08/2017  . Taking multiple medications for chronic disease 12/08/2017  . Primary osteoarthritis of right knee 04/21/2017  . Acute bacterial sinusitis 11/07/2016  . Insomnia 07/02/2016  . Carpal tunnel syndrome 06/29/2016  . Gastroesophageal reflux disease without esophagitis 04/26/2016  . Essential hypertension 04/26/2016  . Generalized anxiety disorder 04/26/2016  . OBESITY, MORBID 03/01/2007   Past Medical History:  Diagnosis Date  . AC (acromioclavicular)  joint bone spurs   . Acid reflux   . Anxiety   . Blood dyscrasia   . CTS (carpal tunnel syndrome)    Bilateral  . Depression   . Gastric ulcer   . GERD (gastroesophageal reflux disease)   . Headache   . Heel spur   . History of chicken pox   . Hypertension   . Panic attacks   . Plantar fasciitis   . Sickle cell trait (HCC)   . Tendonitis    Feet   Family History  Problem Relation Age of Onset  . Hypertension Father [id number]       Deceased  . Heart attack Father   . Hypertension Mother        Living  . Arthritis Mother   . Diabetes Mother   . Dementia Mother   . Depression Mother   . Arthritis Sister        x2  . Lung cancer Sister        Deceased  . Cancer Other        Paternal & Maternal Aunts  . Heart attack Maternal Grandmother   . Neuropathy  Sister   . Bipolar disorder Sister   . Schizophrenia Sister   . Heart disease Brother        x1  . Alcohol abuse Brother   . Alcoholism Brother        x2  . Colon cancer Neg Hx    Past Surgical History:  Procedure Laterality Date  . CHOLECYSTECTOMY    . COLONOSCOPY WITH PROPOFOL N/A 01/09/2018   Procedure: COLONOSCOPY WITH PROPOFOL;  Surgeon: Corbin Adeourk, Robert M, MD;  Location: [facility];  Service: Endoscopy;  Laterality: N/A;  8:45am  . TUBAL LIGATION  08/29/04  . WISDOM TOOTH EXTRACTION     Social History   Occupational History    Comment: not employed   Tobacco Use  . Smoking status: Never Smoker  . Smokeless tobacco: Never Used  Substance and Sexual Activity  . Alcohol use: No  . Drug use: No  . Sexual activity: Not Currently

## 2018-10-12 NOTE — Telephone Encounter (Signed)
Please advise, ok?

## 2018-10-12 NOTE — Progress Notes (Signed)
 .  Numeric Pain Rating Scale and Functional Assessment Average Pain 7   In the last MONTH (on 0-10 scale) has pain interfered with the following?  1. General activity like being  able to carry out your everyday physical activities such as walking, climbing stairs, carrying groceries, or moving a chair?  Rating(6)     

## 2018-10-13 NOTE — Telephone Encounter (Signed)
Can do it for 6 months

## 2018-10-16 NOTE — Telephone Encounter (Signed)
LMOM for patient letting her know we have her handicap placard at front desk

## 2018-10-23 ENCOUNTER — Encounter (HOSPITAL_BASED_OUTPATIENT_CLINIC_OR_DEPARTMENT_OTHER): Payer: Self-pay | Admitting: *Deleted

## 2018-10-23 ENCOUNTER — Other Ambulatory Visit: Payer: Self-pay

## 2018-10-23 ENCOUNTER — Ambulatory Visit (INDEPENDENT_AMBULATORY_CARE_PROVIDER_SITE_OTHER): Payer: Self-pay | Admitting: Physician Assistant

## 2018-10-23 ENCOUNTER — Encounter (INDEPENDENT_AMBULATORY_CARE_PROVIDER_SITE_OTHER): Payer: Self-pay | Admitting: Physician Assistant

## 2018-10-23 DIAGNOSIS — G5603 Carpal tunnel syndrome, bilateral upper limbs: Secondary | ICD-10-CM

## 2018-10-23 DIAGNOSIS — G8929 Other chronic pain: Secondary | ICD-10-CM

## 2018-10-23 DIAGNOSIS — M25561 Pain in right knee: Secondary | ICD-10-CM

## 2018-10-23 MED ORDER — CELECOXIB 200 MG PO CAPS: 200.0000 mg | ORAL_CAPSULE | Freq: Every day | ORAL | 1 refills | Status: DC

## 2018-10-23 MED FILL — CELECOXIB 200 MG CAP: 200 | 30 days supply | Qty: 30 | Fill #0

## 2018-10-23 NOTE — Progress Notes (Signed)
HPI: Mrs. Cervenka comes in today to go over the EMG nerve conduction studies of her upper extremities.  She continues to have numbness tingling in both hands right greater than left.  She is also having pain in her right knee which she has known osteoarthritis.  She does state that the injection in the right knee with cortisone helped.  She is asking for pain medications.  EMG nerve conduction action studies are reviewed with the patient and this showed moderate right median nerve entrapment and mild left median nerve entrapment at the wrist bilaterally.  Physical exam: General well-developed well-nourished female no acute distress mood affect appropriate.  Impression: Bilateral carpal tunnel right moderate left mild Right knee osteoarthritis  Plan: Patient would like to proceed with a right carpal tunnel release.  Risks benefits discussed with patient at length by Dr. Magnus IvanBlackman and myself.  Discussed also with her conservative measures which would include injections and splinting.  She did like to proceed with the carpal tunnel release on the right soon.  Offered a cortisone injection for the left wrist she defers.  Regards to the knee should continue to work on quad strengthening.  Discussed with her that it is not appropriate to use narcotics for arthritic pain or for carpal tunnel syndrome that she has both wrist.  Recommend she stop taking her Mobic which is not helping begin taking Celebrex.  Celebrex was chosen due to patient's gastric reflux.  She will follow-up with us 2 weeks postop from the right carpal tunnel release.

## 2018-10-23 NOTE — Progress Notes (Signed)
Patient will come in for anesthesia consult due to >45 BMI and airway check, has hx OSA.

## 2018-10-24 ENCOUNTER — Other Ambulatory Visit: Payer: Self-pay | Admitting: Internal Medicine

## 2018-10-24 ENCOUNTER — Other Ambulatory Visit (INDEPENDENT_AMBULATORY_CARE_PROVIDER_SITE_OTHER): Payer: Self-pay | Admitting: Physician Assistant

## 2018-10-24 ENCOUNTER — Encounter (HOSPITAL_BASED_OUTPATIENT_CLINIC_OR_DEPARTMENT_OTHER)
Admission: RE | Admit: 2018-10-24 | Discharge: 2018-10-24 | Disposition: A | Discharge status: Home / Self Care | Payer: No Typology Code available for payment source | Source: Ambulatory Visit | Attending: Orthopaedic Surgery | Admitting: Orthopaedic Surgery

## 2018-10-24 DIAGNOSIS — Z01812 Encounter for preprocedural laboratory examination: Secondary | ICD-10-CM | POA: Insufficient documentation

## 2018-10-24 DIAGNOSIS — K219 Gastro-esophageal reflux disease without esophagitis: Secondary | ICD-10-CM

## 2018-10-24 LAB — BASIC METABOLIC PANEL
Anion gap: 8 (ref 5–15)
BUN: 8 mg/dL (ref 6–20)
CHLORIDE: 106 mmol/L (ref 98–111)
CO2: 27 mmol/L (ref 22–32)
Calcium: 9.4 mg/dL (ref 8.9–10.3)
Creatinine, Ser: 0.94 mg/dL (ref 0.44–1.00)
GFR calc Af Amer: >60 mL/min (ref >60)
GFR calc non Af Amer: >60 mL/min (ref >60)
Glucose, Bld: 96 mg/dL (ref 70–99)
POTASSIUM: 3.8 mmol/L (ref 3.5–5.1)
Sodium: 141 mmol/L (ref 135–145)

## 2018-10-24 LAB — POCT PREGNANCY, URINE: Preg Test, Ur: NEGATIVE

## 2018-10-24 MED FILL — PANTOPRAZOLE SOD DR 40 MG T: 40 | 30 days supply | Qty: 30 | Fill #0

## 2018-10-24 MED FILL — ATORVASTATIN 10 MG TABLET: 10 | 30 days supply | Qty: 30 | Fill #1

## 2018-10-24 NOTE — Progress Notes (Signed)
Pt in to have pre-op labs and anesthesia consult. Dr. Renold DonGermeroth in to see pt, instructed pt. To bring CPAP machine DOS.

## 2018-10-24 NOTE — Anesthesia Preprocedure Evaluation (Addendum)
Anesthesia Evaluation  Patient identified by MRN, date of birth, ID band Patient awake    Reviewed: Allergy & Precautions, NPO status , Patient's Chart, lab work & pertinent test results  Airway Mallampati: IV  TM Distance: >3 FB   Mouth opening: Limited Mouth Opening  Dental  (+) Upper Dentures, Missing, Edentulous Upper   Pulmonary sleep apnea and Continuous Positive Airway Pressure Ventilation ,    breath sounds clear to auscultation       Cardiovascular hypertension, Pt. on medications negative cardio ROS   Rhythm:Regular Rate:Normal  TTE 2017 Normal EF, no valvular abnormalities  Stress Test 2017 normal   Neuro/Psych PSYCHIATRIC DISORDERS Anxiety Depression negative neurological ROS     GI/Hepatic Neg liver ROS, PUD, GERD  Medicated,  Endo/Other  Morbid obesity  Renal/GU negative Renal ROS  negative genitourinary   Musculoskeletal  (+) Arthritis , Osteoarthritis,    Abdominal (+) + obese,   Peds  Hematology negative hematology ROS (+) Sickle cell trait ,   Anesthesia Other Findings Right carpal tunnel syndrome  Reproductive/Obstetrics negative OB ROS                           Anesthesia Physical Anesthesia Plan  ASA: III  Anesthesia Plan: MAC   Post-op Pain Management:    Induction: Intravenous  PONV Risk Score and Plan: 3 and Dexamethasone, Ondansetron and Midazolam  Airway Management Planned: Natural Airway and Simple Face Mask  Additional Equipment:   Intra-op Plan:   Post-operative Plan:   Informed Consent: I have reviewed the patients History and Physical, chart, labs and discussed the procedure including the risks, benefits and alternatives for the proposed anesthesia with the patient or authorized representative who has indicated his/her understanding and acceptance.   Dental advisory given  Plan Discussed with: CRNA  Anesthesia Plan Comments:         Anesthesia Quick Evaluation

## 2018-10-26 ENCOUNTER — Encounter (HOSPITAL_BASED_OUTPATIENT_CLINIC_OR_DEPARTMENT_OTHER): Payer: Self-pay | Admitting: *Deleted

## 2018-10-26 ENCOUNTER — Ambulatory Visit (HOSPITAL_BASED_OUTPATIENT_CLINIC_OR_DEPARTMENT_OTHER): Payer: Self-pay | Admitting: Anesthesiology

## 2018-10-26 ENCOUNTER — Encounter (HOSPITAL_BASED_OUTPATIENT_CLINIC_OR_DEPARTMENT_OTHER)
Admission: RE | Disposition: A | Discharge status: Home / Self Care | Payer: Self-pay | Source: Home / Self Care | Attending: Orthopaedic Surgery

## 2018-10-26 ENCOUNTER — Other Ambulatory Visit: Payer: Self-pay

## 2018-10-26 ENCOUNTER — Ambulatory Visit (HOSPITAL_BASED_OUTPATIENT_CLINIC_OR_DEPARTMENT_OTHER)
Admission: RE | Admit: 2018-10-26 | Discharge: 2018-10-26 | Disposition: A | Discharge status: Home / Self Care | Payer: Self-pay | Attending: Orthopaedic Surgery | Admitting: Orthopaedic Surgery

## 2018-10-26 DIAGNOSIS — Z791 Long term (current) use of non-steroidal anti-inflammatories (NSAID): Secondary | ICD-10-CM | POA: Insufficient documentation

## 2018-10-26 DIAGNOSIS — K219 Gastro-esophageal reflux disease without esophagitis: Secondary | ICD-10-CM | POA: Insufficient documentation

## 2018-10-26 DIAGNOSIS — G4730 Sleep apnea, unspecified: Secondary | ICD-10-CM | POA: Insufficient documentation

## 2018-10-26 DIAGNOSIS — D573 Sickle-cell trait: Secondary | ICD-10-CM | POA: Insufficient documentation

## 2018-10-26 DIAGNOSIS — F419 Anxiety disorder, unspecified: Secondary | ICD-10-CM | POA: Insufficient documentation

## 2018-10-26 DIAGNOSIS — I10 Essential (primary) hypertension: Secondary | ICD-10-CM | POA: Insufficient documentation

## 2018-10-26 DIAGNOSIS — G5601 Carpal tunnel syndrome, right upper limb: Secondary | ICD-10-CM | POA: Insufficient documentation

## 2018-10-26 DIAGNOSIS — F329 Major depressive disorder, single episode, unspecified: Secondary | ICD-10-CM | POA: Insufficient documentation

## 2018-10-26 DIAGNOSIS — Z79899 Other long term (current) drug therapy: Secondary | ICD-10-CM | POA: Insufficient documentation

## 2018-10-26 HISTORY — DX: Sleep apnea, unspecified: G47.30

## 2018-10-26 HISTORY — DX: Unspecified osteoarthritis, unspecified site: M19.90

## 2018-10-26 HISTORY — DX: Morbid (severe) obesity due to excess calories: E66.01

## 2018-10-26 HISTORY — PX: CARPAL TUNNEL RELEASE: SHX101

## 2018-10-26 SURGERY — CARPAL TUNNEL RELEASE
Anesthesia: Monitor Anesthesia Care | Site: Wrist | Laterality: Right

## 2018-10-26 MED ORDER — MIDAZOLAM HCL 2 MG/2ML IJ SOLN
INTRAMUSCULAR | Status: AC
  Filled 2018-10-26: qty 2

## 2018-10-26 MED ORDER — PROPOFOL 500 MG/50ML IV EMUL
INTRAVENOUS | Status: DC | PRN
  Administered 2018-10-26: 50 ug/kg/min via INTRAVENOUS

## 2018-10-26 MED ORDER — CLINDAMYCIN PHOSPHATE 900 MG/50ML IV SOLN
900.0000 mg | INTRAVENOUS | Status: AC
  Administered 2018-10-26: 900 mg via INTRAVENOUS

## 2018-10-26 MED ORDER — LACTATED RINGERS IV SOLN
INTRAVENOUS | Status: DC
  Administered 2018-10-26: 09:00:00 via INTRAVENOUS

## 2018-10-26 MED ORDER — SCOPOLAMINE 1 MG/3DAYS TD PT72: 1.0000 | MEDICATED_PATCH | Freq: Once | TRANSDERMAL | Status: DC | PRN

## 2018-10-26 MED ORDER — CHLORHEXIDINE GLUCONATE 4 % EX LIQD: 60.0000 mL | Freq: Once | CUTANEOUS | Status: DC

## 2018-10-26 MED ORDER — BUPIVACAINE HCL (PF) 0.25 % IJ SOLN
INTRAMUSCULAR | Status: DC | PRN
  Administered 2018-10-26: 7.5 mL

## 2018-10-26 MED ORDER — ONDANSETRON HCL 4 MG/2ML IJ SOLN
INTRAMUSCULAR | Status: AC
  Filled 2018-10-26: qty 2

## 2018-10-26 MED ORDER — DEXAMETHASONE SODIUM PHOSPHATE 10 MG/ML IJ SOLN
INTRAMUSCULAR | Status: AC
  Filled 2018-10-26: qty 1

## 2018-10-26 MED ORDER — CLINDAMYCIN PHOSPHATE 900 MG/50ML IV SOLN
INTRAVENOUS | Status: AC
  Filled 2018-10-26: qty 50

## 2018-10-26 MED ORDER — 0.9 % SODIUM CHLORIDE (POUR BTL) OPTIME
TOPICAL | Status: DC | PRN
  Administered 2018-10-26: 1000 mL

## 2018-10-26 MED ORDER — FENTANYL CITRATE (PF) 100 MCG/2ML IJ SOLN
INTRAMUSCULAR | Status: DC | PRN
  Administered 2018-10-26: 50 ug via INTRAVENOUS
  Administered 2018-10-26: 25 ug via INTRAVENOUS

## 2018-10-26 MED ORDER — MIDAZOLAM HCL 5 MG/5ML IJ SOLN
INTRAMUSCULAR | Status: DC | PRN
  Administered 2018-10-26: 2 mg via INTRAVENOUS

## 2018-10-26 MED ORDER — HYDROCODONE-ACETAMINOPHEN 5-325 MG PO TABS: 1.0000 | ORAL_TABLET | Freq: Four times a day (QID) | ORAL | 0 refills | Status: DC | PRN

## 2018-10-26 MED ORDER — FENTANYL CITRATE (PF) 100 MCG/2ML IJ SOLN: 50.0000 ug | INTRAMUSCULAR | Status: DC | PRN

## 2018-10-26 MED ORDER — LIDOCAINE HCL (PF) 1 % IJ SOLN
INTRAMUSCULAR | Status: DC | PRN
  Administered 2018-10-26: 7.5 mL

## 2018-10-26 MED ORDER — DEXAMETHASONE SODIUM PHOSPHATE 10 MG/ML IJ SOLN
INTRAMUSCULAR | Status: DC | PRN
  Administered 2018-10-26: 10 mg via INTRAVENOUS

## 2018-10-26 MED ORDER — FENTANYL CITRATE (PF) 100 MCG/2ML IJ SOLN
INTRAMUSCULAR | Status: AC
  Filled 2018-10-26: qty 2

## 2018-10-26 MED ORDER — ONDANSETRON HCL 4 MG/2ML IJ SOLN
INTRAMUSCULAR | Status: DC | PRN
  Administered 2018-10-26: 4 mg via INTRAVENOUS

## 2018-10-26 MED ORDER — MIDAZOLAM HCL 2 MG/2ML IJ SOLN: 1.0000 mg | INTRAMUSCULAR | Status: DC | PRN

## 2018-10-26 SURGICAL SUPPLY — 41 items
APL SKNCLS STERI-STRIP NONHPOA (GAUZE/BANDAGES/DRESSINGS)
BANDAGE ACE 3X5.8 VEL STRL LF (GAUZE/BANDAGES/DRESSINGS) ×2 IMPLANT
BENZOIN TINCTURE PRP APPL 2/3 (GAUZE/BANDAGES/DRESSINGS) IMPLANT
BLADE SURG 15 STRL LF DISP TIS (BLADE) ×2 IMPLANT
BLADE SURG 15 STRL SS (BLADE) ×4
BNDG CMPR 9X4 STRL LF SNTH (GAUZE/BANDAGES/DRESSINGS) ×1
BNDG ESMARK 4X9 LF (GAUZE/BANDAGES/DRESSINGS) ×1 IMPLANT
CORD BIPOLAR FORCEPS 12FT (ELECTRODE) ×2 IMPLANT
COVER BACK TABLE 60X90IN (DRAPES) ×2 IMPLANT
COVER MAYO STAND STRL (DRAPES) ×2 IMPLANT
COVER WAND RF STERILE (DRAPES) IMPLANT
CUFF TOURNIQUET SINGLE 18IN (TOURNIQUET CUFF) ×2 IMPLANT
CUFF TOURNIQUET SINGLE 24IN (TOURNIQUET CUFF) IMPLANT
DRAPE EXTREMITY T 121X128X90 (DISPOSABLE) ×2 IMPLANT
DRAPE SURG 17X23 STRL (DRAPES) ×2 IMPLANT
DURAPREP 26ML APPLICATOR (WOUND CARE) ×2 IMPLANT
GAUZE SPONGE 4X4 12PLY STRL (GAUZE/BANDAGES/DRESSINGS) ×2 IMPLANT
GAUZE XEROFORM 1X8 LF (GAUZE/BANDAGES/DRESSINGS) ×2 IMPLANT
GLOVE BIO SURGEON STRL SZ7 (GLOVE) ×1 IMPLANT
GLOVE BIO SURGEON STRL SZ7.5 (GLOVE) ×4 IMPLANT
GLOVE BIOGEL PI IND STRL 7.5 (GLOVE) IMPLANT
GLOVE BIOGEL PI IND STRL 8 (GLOVE) ×2 IMPLANT
GLOVE BIOGEL PI INDICATOR 7.5 (GLOVE) ×1
GLOVE BIOGEL PI INDICATOR 8 (GLOVE) ×2
GOWN STRL REUS W/ TWL LRG LVL3 (GOWN DISPOSABLE) ×1 IMPLANT
GOWN STRL REUS W/TWL LRG LVL3 (GOWN DISPOSABLE) ×2
LOOP VESSEL MAXI BLUE (MISCELLANEOUS) IMPLANT
NDL HYPO 25X1 1.5 SAFETY (NEEDLE) IMPLANT
NEEDLE HYPO 25X1 1.5 SAFETY (NEEDLE) IMPLANT
NS IRRIG 1000ML POUR BTL (IV SOLUTION) ×2 IMPLANT
PACK BASIN DAY SURGERY FS (CUSTOM PROCEDURE TRAY) ×2 IMPLANT
PAD CAST 3X4 CTTN HI CHSV (CAST SUPPLIES) ×1 IMPLANT
PADDING CAST ABS 4INX4YD NS (CAST SUPPLIES) ×1
PADDING CAST ABS COTTON 4X4 ST (CAST SUPPLIES) ×1 IMPLANT
PADDING CAST COTTON 3X4 STRL (CAST SUPPLIES) ×2
STOCKINETTE 4X48 STRL (DRAPES) ×2 IMPLANT
SUT ETHILON 3 0 PS 1 (SUTURE) ×2 IMPLANT
SYR BULB 3OZ (MISCELLANEOUS) ×2 IMPLANT
SYR CONTROL 10ML LL (SYRINGE) ×1 IMPLANT
TOWEL GREEN STERILE FF (TOWEL DISPOSABLE) ×2 IMPLANT
UNDERPAD 30X30 (UNDERPADS AND DIAPERS) ×2 IMPLANT

## 2018-10-26 NOTE — Transfer of Care (Signed)
Immediate Anesthesia Transfer of Care Note  Patient: Amy Perez  Procedure(s) Performed: RIGHT CARPAL TUNNEL RELEASE (Right Wrist)  Patient Location: PACU  Anesthesia Type:MAC  Level of Consciousness: drowsy and patient cooperative  Airway & Oxygen Therapy: Patient Spontanous Breathing and Patient connected to face mask oxygen  Post-op Assessment: Report given to RN and Post -op Vital signs reviewed and stable  Post vital signs: Reviewed and stable  Last Vitals:  Vitals Value Taken Time  BP 128/86 10/26/2018  9:52 AM  Temp    Pulse 76 10/26/2018  9:56 AM  Resp 19 10/26/2018  9:56 AM  SpO2 100 % 10/26/2018  9:56 AM  Vitals shown include unvalidated device data.  Last Pain:  Vitals:   10/26/18 0819  TempSrc: Oral  PainSc: 6          Complications: No apparent anesthesia complications

## 2018-10-26 NOTE — H&P (Signed)
Amy Perez is an 53 y.o. female.   Chief Complaint: right hand pain and numbness; known carpal tunnel syndrome HPI: The patient is a 53 year old female with known significant carpal tunnel syndrome involving the right upper extremity.  This is been confirmed with clinical exam and nerve conduction studies.  At this point she is tried and failed all forms of conservative treatment.  We talked about carpal tunnel release surgery.  We talked about her operative and nonoperative treatment options.  She does wish proceed with surgery given the worsening of her symptoms and this being her dominant hand.  Past Medical History:  Diagnosis Date  . AC (acromioclavicular) joint bone spurs   . Acid reflux   . Anxiety   . Arthritis    knees  . CTS (carpal tunnel syndrome)    Bilateral  . Depression   . Gastric ulcer   . GERD (gastroesophageal reflux disease)   . Headache   . Heel spur   . History of chicken pox   . Hypertension   . Morbid obesity (HCC)   . Panic attacks   . Plantar fasciitis   . Sickle cell trait (HCC)   . Sleep apnea    uses sometimes  . Tendonitis    Feet    Past Surgical History:  Procedure Laterality Date  . CHOLECYSTECTOMY    . COLONOSCOPY WITH PROPOFOL N/A 01/09/2018   Procedure: COLONOSCOPY WITH PROPOFOL;  Surgeon: Corbin Ade, MD;  Location: [facility];  Service: Endoscopy;  Laterality: N/A;  8:45am  . TUBAL LIGATION  08/29/04  . WISDOM TOOTH EXTRACTION      Family History  Problem Relation Age of Onset  . Hypertension Father 71       Deceased  . Heart attack Father   . Hypertension Mother        Living  . Arthritis Mother   . Diabetes Mother   . Dementia Mother   . Depression Mother   . Arthritis Sister        x2  . Lung cancer Sister        Deceased  . Cancer Other        Paternal & Maternal Aunts  . Heart attack Maternal Grandmother   . Neuropathy Sister   . Bipolar disorder Sister   . Schizophrenia Sister   . Heart disease  Brother        x1  . Alcohol abuse Brother   . Alcoholism Brother        x2  . Colon cancer Neg Hx    Social History:  reports that she has never smoked. She has never used smokeless tobacco. She reports that she does not drink alcohol or use drugs.  Allergies:  Allergies  Allergen Reactions  . Benadryl [Diphenhydramine Hcl] Itching  . Milk-Related Compounds Diarrhea  . Oxycodone-Acetaminophen Nausea And Vomiting  . Penicillins Nausea Only    Amoxicillin Has patient had a PCN reaction causing immediate rash, facial/tongue/throat swelling, SOB or lightheadedness with hypotension:__________ Has patient had a PCN reaction causing severe rash involving mucus membranes or skin necrosis:________ Has patient had a PCN reaction that required hospitalization:________ Has patient had a PCN reaction occurring within the last 10 years: ________ If all of the above answers are "NO", then may proceed with Cephalosporin use.     Medications Prior to Admission  Medication Sig Dispense Refill  . amLODipine (NORVASC) 10 MG tablet Take 1 tablet (10 mg total) by mouth daily. 30 tablet  6  . atorvastatin (LIPITOR) 10 MG tablet TAKE 1 TABLET BY MOUTH DAILY. 30 tablet 2  . buPROPion (WELLBUTRIN XL) 300 MG 24 hr tablet Take 1 tablet (300 mg total) by mouth daily. 30 tablet 2  . celecoxib (CELEBREX) 200 MG capsule Take 1 capsule (200 mg total) by mouth daily. 30 capsule 1  . diclofenac sodium (VOLTAREN) 1 % GEL Apply 2 g topically 4 (four) times daily. 100 g 0  . fluticasone (FLONASE) 50 MCG/ACT nasal spray Place 2 sprays into both nostrils daily. (Patient taking differently: Place 2 sprays into both nostrils as needed. ) 16 g 0  . loratadine (CLARITIN) 10 MG tablet Take 1 tablet (10 mg total) by mouth daily. 30 tablet 2  . Multiple Vitamin (MULTIVITAMIN) tablet Take 1 tablet by mouth daily. Reported on 04/28/2016    . pantoprazole (PROTONIX) 40 MG tablet TAKE 1 TABLET BY MOUTH DAILY. 30 tablet 2  .  polyethylene glycol powder (GLYCOLAX/MIRALAX) powder   5  . traZODone (DESYREL) 50 MG tablet Take 1-2 tab at bed time 30 tablet 0    Results for orders placed or performed during the hospital encounter of 10/26/18 (from the past 48 hour(s))  Basic metabolic panel     Status: None   Collection Time: 10/24/18 12:42 PM  Result Value Ref Range   Sodium 141 135 - 145 mmol/L   Potassium 3.8 3.5 - 5.1 mmol/L   Chloride 106 98 - 111 mmol/L   CO2 27 22 - 32 mmol/L   Glucose, Bld 96 70 - 99 mg/dL   BUN 8 6 - 20 mg/dL   Creatinine, Ser 8.52 0.44 - 1.00 mg/dL   Calcium 9.4 8.9 - 77.8 mg/dL   GFR calc non Af Amer >60 >60 mL/min   GFR calc Af Amer >60 >60 mL/min   Anion gap 8 5 - 15    Comment: Performed at [facility], [address], [place], [place] [address]   No results found.  ROS  Blood pressure (!) 145/72, pulse 84, temperature 97.7 F (36.5 C), temperature source Oral, resp. rate 18, height 5\' 6"  (1.676 m), weight 135.4 kg, last menstrual period 12/14/2017, SpO2 100 %. Physical Exam  Constitutional: She is oriented to person, place, and time. She appears well-developed and well-nourished.  HENT:  Head: Normocephalic.  Eyes: Pupils are equal, round, and reactive to light.  Neck: Normal range of motion.  Cardiovascular: Normal rate.  Respiratory: Effort normal.  GI: Soft. Bowel sounds are normal.  Musculoskeletal:     Right hand: She exhibits tenderness. Decreased sensation noted. Decreased sensation is present in the medial distribution. Decreased strength noted.  Neurological: She is alert and oriented to person, place, and time.  Skin: Skin is warm and dry.  Psychiatric: She has a normal mood and affect.     Assessment/Plan Right carpal tunnel syndrome  We will proceed to the operating room today for a right open carpal tunnel release under IV sedation and local anesthesia.  The risk and benefits of surgery been explained in detail.  Her right extremity is marked and  informed consent is obtained.  Kathryne Hitch, MD 10/26/2018, 9:14 AM

## 2018-10-26 NOTE — Discharge Instructions (Signed)
You may use her right hand as comfort allows. Do expect right hand swelling.  Ice and elevate as needed. Still expect some decreased hand strength and numbness and tingling given the severity of your carpal tunnel syndrome. You need to leave these dressings on for the next 5 days.  Keep your dressings clean and dry. In 5 days, you remove your dressing and get your incision wet in the shower daily.  Do not submerge underwater. Place daily Band-Aids of your incision after each shower starting 5 days from now.          HAND SURGERY    HOME CARE INSTRUCTIONS    The following instructions have been prepared to help you care for yourself upon your return home today.  Wound Care:  Keep your hand elevated above the level of your heart. Do not allow it to dangle by your side. Keep the dressing dry and do not remove it unless your doctor advises you to do so. He will usually change it at the time of you post-op visit. Moving your fingers is advised to stimulate circulation but will depend on the site of your surgery. Of course, if you have a splint applied your doctor will advise you about movement.  Activity:  Do not drive or operate machinery today. Rest today and then you may return to your normal activity and work as indicated by your physician.  Diet: Drink liquids today or eat a light diet. You may resume a regular diet tomorrow.  General expectations: Pain for two or three days. Fingers may become slightly swollen.   Unexpected Observations- Call your doctor if any of these occur: Severe pain not relieved by pain medication. Elevated temperature. Dressing soaked with blood. Inability to move fingers. White or bluish color to fingers.      Post Anesthesia Home Care Instructions  Activity: Get plenty of rest for the remainder of the day. A responsible individual must stay with you for 24 hours following the procedure.  For the next 24 hours, DO NOT: -Drive a car -Social worker -Drink alcoholic beverages -Take any medication unless instructed by your physician -Make any legal decisions or sign important papers.  Meals: Start with liquid foods such as gelatin or soup. Progress to regular foods as tolerated. Avoid greasy, spicy, heavy foods. If nausea and/or vomiting occur, drink only clear liquids until the nausea and/or vomiting subsides. Call your physician if vomiting continues.  Special Instructions/Symptoms: Your throat may feel dry or sore from the anesthesia or the breathing tube placed in your throat during surgery. If this causes discomfort, gargle with warm salt water. The discomfort should disappear within 24 hours.  If you had a scopolamine patch placed behind your ear for the management of post- operative nausea and/or vomiting:  1. The medication in the patch is effective for 72 hours, after which it should be removed.  Wrap patch in a tissue and discard in the trash. Wash hands thoroughly with soap and water. 2. You may remove the patch earlier than 72 hours if you experience unpleasant side effects which may include dry mouth, dizziness or visual disturbances. 3. Avoid touching the patch. Wash your hands with soap and water after contact with the patch.

## 2018-10-26 NOTE — Anesthesia Postprocedure Evaluation (Signed)
Anesthesia Post Note  Patient: Amy Perez  Procedure(s) Performed: RIGHT CARPAL TUNNEL RELEASE (Right Wrist)     Patient location during evaluation: PACU Anesthesia Type: MAC Level of consciousness: awake and alert Pain management: pain level controlled Vital Signs Assessment: post-procedure vital signs reviewed and stable Respiratory status: spontaneous breathing, nonlabored ventilation, respiratory function stable and patient connected to nasal cannula oxygen Cardiovascular status: stable and blood pressure returned to baseline Postop Assessment: no apparent nausea or vomiting Anesthetic complications: no    Last Vitals:  Vitals:   10/26/18 1015 10/26/18 1045  BP: 112/70 121/76  Pulse: 74 76  Resp: 20 18  Temp:  36.7 C  SpO2: 97% 99%    Last Pain:  Vitals:   10/26/18 1045  TempSrc:   PainSc: 1                  Dawn Kiper L Tanikka Bresnan

## 2018-10-26 NOTE — Op Note (Signed)
NAME: Amy Perez, WALKENHORST MEDICAL RECORD AV:[id number] ACCOUNT [id number] DATE OF BIRTH:1966-04-08 FACILITY: [facility] LOCATION: [facility] PHYSICIAN:Erek Kowal Aretha Parrot, MD  OPERATIVE REPORT  DATE OF PROCEDURE:  10/26/2018  PREOPERATIVE DIAGNOSIS:  Moderate to severe right carpal tunnel syndrome.  POSTOPERATIVE DIAGNOSIS:  Moderate to severe right carpal tunnel syndrome.  PROCEDURE:  Right open carpal tunnel release.  SURGEON:  Vanita Panda. Magnus Ivan, MD  ASSISTANT:  Richardean Canal, PA-C.  ANESTHESIA: 1.  Mask ventilation, IV sedation. 2.  Local infiltration of the operative site with a mixture of plain lidocaine and plain Marcaine.  ANTIBIOTICS:  Two grams IV Ancef.  ESTIMATED BLOOD LOSS:  Minimal.  TOURNIQUET TIME:  Less than 20 minutes.  COMPLICATIONS:  None.  INDICATIONS:  The patient is a 53 year old female with significant carpal tunnel syndrome involving her right upper extremity.  This was confirmed with clinical exam and nerve conduction studies.  Given the severity of her disease, we have recommended  open carpal tunnel release.  We talked about the risks and benefits of surgery.  We talked about the nonoperative and operative treatment options.  At this point, she does wish to proceed with surgery due to worsening of her condition.  We discussed in  detail the risks and benefits of the surgery.  DESCRIPTION OF PROCEDURE:  After informed consent was obtained and appropriate right hand was marked, she was brought to the operating room and placed supine on the operating table.  The right arm on an arm table.  Mask ventilation, IV sedation was  obtained, a nonsterile tourniquet was placed around her upper right arm and the right hand and wrist were prepped and draped with DuraPrep and sterile drapes.  A time-out was called.  She was identified as correct patient, correct right hand.  We then  used an Esmarch to wrap the wrist.  The tourniquet was inflated to 250 mm of  pressure.  We then anesthetized the surgical site with a mixture of plain lidocaine and Marcaine.  We then made an incision over the transverse carpal ligament in the palm and  carried this proximally and distally.  Due to her obesity, we actually did make a bigger incision so we could fully visualize the transverse carpal ligament.  We were able to slowly and meticulously release this without difficulty and explore the median  nerve and found it and its motor branch to be intact.  We then irrigated the soft tissue with normal saline solution.  We closed the incision with interrupted nylon suture.  Xeroform well-padded sterile dressing was applied.  The tourniquet was let down.   Her fingers pinkened nicely.  She was taken to recovery room in stable condition.  All final counts were correct.  There were no complications noted.  TN/NUANCE  D:10/26/2018 T:10/26/2018 JOB:[id number]

## 2018-10-27 ENCOUNTER — Encounter (HOSPITAL_BASED_OUTPATIENT_CLINIC_OR_DEPARTMENT_OTHER): Payer: Self-pay | Admitting: Orthopaedic Surgery

## 2018-10-27 ENCOUNTER — Ambulatory Visit: Payer: Self-pay | Admitting: Internal Medicine

## 2018-11-02 ENCOUNTER — Telehealth: Payer: Self-pay | Admitting: Internal Medicine

## 2018-11-02 NOTE — Telephone Encounter (Signed)
Patient called requesting for the nurse to ask her PCP is she can take a tyroid check. Please advise.

## 2018-11-02 NOTE — Telephone Encounter (Signed)
Will forward to pcp

## 2018-11-03 ENCOUNTER — Telehealth: Payer: Self-pay | Admitting: Pulmonary Disease

## 2018-11-03 NOTE — Telephone Encounter (Signed)
Left message for patient to call back  

## 2018-11-03 NOTE — Telephone Encounter (Signed)
Returned pt call and went over Dr. Johnson response pt doesn't have any questions or concerns  

## 2018-11-06 MED FILL — AMLODIPINE BESYLATE 10 MG T: 10 | 30 days supply | Qty: 30 | Fill #3

## 2018-11-06 MED FILL — BUPROPION HCL XL 300 MG TAB: 300 | 30 days supply | Qty: 30 | Fill #1

## 2018-11-06 NOTE — Telephone Encounter (Signed)
Routing to Katie 

## 2018-11-06 NOTE — Telephone Encounter (Signed)
LMTCB

## 2018-11-06 NOTE — Telephone Encounter (Signed)
Please contact patient to see what RFC forms are; I am unaware but more than willing to help her;please let her know I am in clinic today. Thanks.

## 2018-11-06 NOTE — Telephone Encounter (Signed)
Pt is calling back [phone]

## 2018-11-06 NOTE — Telephone Encounter (Signed)
Called and spoke with pt to see what the RFC forms were. Pt stated to me she has been trying to file for disability and stated that she filled out the application for disability about 5 months ago.  I stated to pt that the applications are usually sent to our office once trying to file for disability so the provider can fill out their necessary areas. Stated to pt to try to call the disability office to see if they can send [place] her application so we can get it taken care of for her. When trying to provide patient with our fax number so she can give it to the disability company, pt stated to me she was currently walking around a store and was unable to write any info down.  I stated to pt to call office back when she would be able to write info down. When pt calls back, please provide her with our office fax number so she can give it to the disability company. Thanks!

## 2018-11-09 ENCOUNTER — Ambulatory Visit (INDEPENDENT_AMBULATORY_CARE_PROVIDER_SITE_OTHER): Payer: Self-pay | Admitting: Physician Assistant

## 2018-11-09 ENCOUNTER — Encounter (INDEPENDENT_AMBULATORY_CARE_PROVIDER_SITE_OTHER): Payer: Self-pay | Admitting: Physician Assistant

## 2018-11-09 DIAGNOSIS — G5601 Carpal tunnel syndrome, right upper limb: Secondary | ICD-10-CM

## 2018-11-09 NOTE — Progress Notes (Signed)
HPI: Mrs. Reigel returns today 2 weeks status post right carpal tunnel release.  She states overall that she can tell a difference in the numbness tingling in her fingers.  She has had no fevers chills.  Some discomfort and soreness about the hand is would aspect.  Physical exam right hand: Radial pulses intact.  Interrupted nylon sutures well approximate the incision there is slight maceration.  No signs of gross infection.  Impression: 2-week status post right carpal tunnel release  Plan: We will remove the sutures today.  Steri-Strips applied.  She will work on scar tissue mobilization.  We will see her back in 1 month sooner if she has any signs of infection or any questions or concerns.

## 2018-11-09 NOTE — Telephone Encounter (Signed)
Spoke with the pt  I made her aware of protocol for disability forms Need to go to [employer] first before coming here to be signed [employer] number is [phone] I gave her the phone number  She will contact them and will call us as needed  Nothing further needed

## 2018-11-24 ENCOUNTER — Telehealth (HOSPITAL_COMMUNITY): Payer: Self-pay | Admitting: Psychiatry

## 2018-11-24 NOTE — Telephone Encounter (Signed)
I received message from patient's therapist Patton Salles at [facility] [place].  I returned phone call at [phone] Ext # [phone] and left a message to call us back.

## 2018-11-27 MED FILL — ATORVASTATIN 10 MG TABLET: 10 | 30 days supply | Qty: 30 | Fill #2

## 2018-11-27 MED FILL — PANTOPRAZOLE SOD DR 40 MG T: 40 | 30 days supply | Qty: 30 | Fill #1

## 2018-11-30 ENCOUNTER — Encounter: Payer: Self-pay | Admitting: Internal Medicine

## 2018-11-30 ENCOUNTER — Ambulatory Visit: Payer: Self-pay

## 2018-11-30 ENCOUNTER — Ambulatory Visit: Payer: Self-pay | Attending: Internal Medicine | Admitting: Internal Medicine

## 2018-11-30 VITALS — BP 131/86 | HR 93 | Temp 98.3°F | Resp 16 | Wt 300.4 lb

## 2018-11-30 DIAGNOSIS — F331 Major depressive disorder, recurrent, moderate: Secondary | ICD-10-CM

## 2018-11-30 DIAGNOSIS — G4733 Obstructive sleep apnea (adult) (pediatric): Secondary | ICD-10-CM

## 2018-11-30 DIAGNOSIS — M1711 Unilateral primary osteoarthritis, right knee: Secondary | ICD-10-CM

## 2018-11-30 DIAGNOSIS — I10 Essential (primary) hypertension: Secondary | ICD-10-CM

## 2018-11-30 MED ORDER — LOSARTAN POTASSIUM 25 MG PO TABS: 25.0000 mg | ORAL_TABLET | Freq: Every day | ORAL | 6 refills | Status: DC

## 2018-11-30 MED ORDER — MELOXICAM 15 MG PO TABS: 15.0000 mg | ORAL_TABLET | Freq: Every day | ORAL | 6 refills | Status: DC

## 2018-11-30 MED FILL — LOSARTAN POTASSIUM 25 MG TA: 25 | 30 days supply | Qty: 30 | Fill #0

## 2018-11-30 MED FILL — MELOXICAM 15 MG TABLET: 15 | 30 days supply | Qty: 30 | Fill #0

## 2018-11-30 NOTE — Progress Notes (Signed)
Patient ID: Amy Perez, female    DOB: [birth year]  MRN: [id number]  CC: Hypertension and Depression   Subjective: Amy Perez is a 53 y.o. female who presents for chronic ds management Her concerns today include:  Pt with hx of HTN, GERD, anxiety/depression, bone spurs in heels, morbidity obesity, OSA  CTS:  Had RT carpal tunnel release.  Doing well  OSA:  Did get CPAP machine.  Told that she has to change her mask Q  Depression/Anxiety:  Seeing therapist and Dr. Lolly Mustache.  Feels like Wellbutrin not helping. Has mood swings - gets angry and upset and feels more depress.  She has spoken with her therapist about it and the therapist supposed to speak with Dr. Lolly Mustache to see what he recommends.  She meets with a therapist tomorrow  Obesity:  Just started walking daily for past 3 wks at the [facility]. complains of soreness in her muscles.  Plans to start using stationary bike there also.  She has been trying to cut back on the amount she eats. Missed appts with nutritionist  Was on Meloxicam for arthritis but changed to Celebrex by orthopedics during the time that she was having her carpal tunnel syndrome addressed.  She feels this does not work as well for her as the meloxicam did  HTN:  Compliant with Norvasc.  Checks BP once a wk.  Gives range of 135-140/80s Patient Active Problem List   Diagnosis Date Noted  . Moderate episode of recurrent major depressive disorder (HCC) 11/30/2018  . Carpal tunnel syndrome, right upper limb 10/26/2018  . OSA (obstructive sleep apnea) 07/27/2018  . Hypomagnesemia 03/27/2018  . Constipation 12/08/2017  . Taking multiple medications for chronic disease 12/08/2017  . Primary osteoarthritis of right knee 04/21/2017  . Acute bacterial sinusitis 11/07/2016  . Insomnia 07/02/2016  . Carpal tunnel syndrome 06/29/2016  . Gastroesophageal reflux disease without esophagitis 04/26/2016  . Essential hypertension 04/26/2016  . Generalized  anxiety disorder 04/26/2016  . OBESITY, MORBID 03/01/2007     Current Outpatient Medications on File Prior to Visit  Medication Sig Dispense Refill  . amLODipine (NORVASC) 10 MG tablet Take 1 tablet (10 mg total) by mouth daily. 30 tablet 6  . atorvastatin (LIPITOR) 10 MG tablet TAKE 1 TABLET BY MOUTH DAILY. 30 tablet 2  . buPROPion (WELLBUTRIN XL) 300 MG 24 hr tablet Take 1 tablet (300 mg total) by mouth daily. 30 tablet 2  . diclofenac sodium (VOLTAREN) 1 % GEL Apply 2 g topically 4 (four) times daily. 100 g 0  . fluticasone (FLONASE) 50 MCG/ACT nasal spray Place 2 sprays into both nostrils daily. (Patient taking differently: Place 2 sprays into both nostrils as needed. ) 16 g 0  . HYDROcodone-acetaminophen (NORCO/VICODIN) 5-325 MG tablet Take 1-2 tablets by mouth every 6 (six) hours as needed for moderate pain. 40 tablet 0  . loratadine (CLARITIN) 10 MG tablet Take 1 tablet (10 mg total) by mouth daily. 30 tablet 2  . Multiple Vitamin (MULTIVITAMIN) tablet Take 1 tablet by mouth daily. Reported on 04/28/2016    . pantoprazole (PROTONIX) 40 MG tablet TAKE 1 TABLET BY MOUTH DAILY. 30 tablet 2  . polyethylene glycol powder (GLYCOLAX/MIRALAX) powder   5  . traZODone (DESYREL) 50 MG tablet Take 1-2 tab at bed time 30 tablet 0   No current facility-administered medications on file prior to visit.     Allergies  Allergen Reactions  . Benadryl [Diphenhydramine Hcl] Itching  . Milk-Related Compounds  Diarrhea  . Oxycodone-Acetaminophen Nausea And Vomiting  . Penicillins Nausea Only    Amoxicillin Has patient had a PCN reaction causing immediate rash, facial/tongue/throat swelling, SOB or lightheadedness with hypotension:__________ Has patient had a PCN reaction causing severe rash involving mucus membranes or skin necrosis:________ Has patient had a PCN reaction that required hospitalization:________ Has patient had a PCN reaction occurring within the last 10 years: ________ If all of the  above answers are "NO", then may proceed with Cephalosporin use.     Social History   Socioeconomic History  . Marital status: Single    Spouse name: Not on file  . Number of children: 0  . Years of education: Not on file  . Highest education level: Some college, no degree  Occupational History    Comment: not employed   Social Needs  . Financial resource strain: Hard  . Food insecurity:    Worry: Often true    Inability: Often true  . Transportation needs:    Medical: Yes    Non-medical: Yes  Tobacco Use  . Smoking status: Never Smoker  . Smokeless tobacco: Never Used  Substance and Sexual Activity  . Alcohol use: No  . Drug use: No  . Sexual activity: Not Currently    Birth control/protection: Post-menopausal    Comment: BTL  Lifestyle  . Physical activity:    Days per week: 0 days    Minutes per session: 0 min  . Stress: Not on file  Relationships  . Social connections:    Talks on phone: Not on file    Gets together: Not on file    Attends religious service: More than 4 times per year    Active member of club or organization: No    Attends meetings of clubs or organizations: Never    Relationship status: Never married  . Intimate partner violence:    Fear of current or ex partner: No    Emotionally abused: No    Physically abused: No    Forced sexual activity: No  Other Topics Concern  . Not on file  Social History Narrative  . Not on file    Family History  Problem Relation Age of Onset  . Hypertension Father [id number]       Deceased  . Heart attack Father   . Hypertension Mother        Living  . Arthritis Mother   . Diabetes Mother   . Dementia Mother   . Depression Mother   . Arthritis Sister        x2  . Lung cancer Sister        Deceased  . Cancer Other        Paternal & Maternal Aunts  . Heart attack Maternal Grandmother   . Neuropathy Sister   . Bipolar disorder Sister   . Schizophrenia Sister   . Heart disease Brother        x1  .  Alcohol abuse Brother   . Alcoholism Brother        x2  . Colon cancer Neg Hx     Past Surgical History:  Procedure Laterality Date  . CARPAL TUNNEL RELEASE Right 10/26/2018   Procedure: RIGHT CARPAL TUNNEL RELEASE;  Surgeon: Kathryne HitchBlackman, Christopher Y, MD;  Location: [facility];  Service: Orthopedics;  Laterality: Right;  . CHOLECYSTECTOMY    . COLONOSCOPY WITH PROPOFOL N/A 01/09/2018   Procedure: COLONOSCOPY WITH PROPOFOL;  Surgeon: Corbin Adeourk, Robert M, MD;  Location: [facility];  Service: Endoscopy;  Laterality: N/A;  8:45am  . TUBAL LIGATION  08/29/04  . WISDOM TOOTH EXTRACTION      ROS: Review of Systems Neg except as above PHYSICAL EXAM: BP 131/86   Pulse 93   Temp 98.3 F (36.8 C) (Oral)   Resp 16   Wt (!) 300 lb 6.4 oz (136.3 kg)   LMP 12/14/2017 Comment: BTL  SpO2 98%   BMI 48.49 kg/m    Wt Readings from Last 3 Encounters:  11/30/18 (!) 300 lb 6.4 oz (136.3 kg)  10/26/18 298 lb 8.1 oz (135.4 kg)  10/07/18 285 lb (129.3 kg)   !35/90 Physical Exam  General appearance - alert, well appearing, obese female and in no distress Mental status - normal mood, behavior, speech, dress, motor activity, and thought processes Neck - supple, no significant adenopathy Chest - clear to auscultation, no wheezes, rales or rhonchi, symmetric air entry Heart - normal rate, regular rhythm, normal S1, S2, no murmurs, rubs, clicks or gallops  ASSESSMENT AND PLAN:  1. Essential hypertension Not at goal.  Continue amlodipine.  Add Cozaar.  Continue low-salt diet. - losartan (COZAAR) 25 MG tablet; Take 1 tablet (25 mg total) by mouth daily.  Dispense: 30 tablet; Refill: 6  2. Morbid obesity (HCC) Commended her on trying to change her eating habits.  We will try to get her rescheduled with nutritionist and encouraged her to keep the appointment. Commended her on regular exercise.  However given her increased soreness with recent start of exercise activity, I recommend that she  start low and go slow.  Exercise 3-4 x a wk for 20 mins for starters then increase - Amb ref to Medical Nutrition Therapy-[facility]  3. Moderate episode of recurrent major depressive disorder (HCC) Pt has appt with therapist tomorrow.  She will find out then whether Dr. Lolly MustacheArfeen plans to change med.   4. OSA (obstructive sleep apnea) Continue CPAP  5. Primary osteoarthritis of right knee Change from Celebrex to Mobic - meloxicam (MOBIC) 15 MG tablet; Take 1 tablet (15 mg total) by mouth daily.  Dispense: 30 tablet; Refill: 6    Patient was given the opportunity to ask questions.  Patient verbalized understanding of the plan and was able to repeat key elements of the plan.   Orders Placed This Encounter  Procedures  . Amb ref to Medical Nutrition Therapy-[facility]     Requested Prescriptions   Signed Prescriptions Disp Refills  . losartan (COZAAR) 25 MG tablet 30 tablet 6    Sig: Take 1 tablet (25 mg total) by mouth daily.  . meloxicam (MOBIC) 15 MG tablet 30 tablet 6    Sig: Take 1 tablet (15 mg total) by mouth daily.    Return in about 3 months (around 02/28/2019).  Jonah Blueeborah , MD, FACP

## 2018-11-30 NOTE — Patient Instructions (Signed)
Stop Celebrex.  I have changed it back to meloxicam.  Your blood pressure is not controlled.  The goal is 130/80 or lower.  Continue amlodipine.  I have added a new medicine called losartan 25 mg daily.  Continue to limit salt in the foods.  I would change her exercise routine to 3-4 times a week for the next month and then gradually increase to daily.

## 2018-12-05 ENCOUNTER — Telehealth: Payer: Self-pay | Admitting: Internal Medicine

## 2018-12-05 NOTE — Telephone Encounter (Signed)
Pt was informed that we just got the papers that was missing, now we have to look and check to see if she qualified and will be mailing everything that she can qualified

## 2018-12-05 NOTE — Telephone Encounter (Signed)
Pt came in to get an update on blue card, pt also dropped off missing documentations

## 2018-12-06 ENCOUNTER — Other Ambulatory Visit: Payer: Self-pay | Admitting: Internal Medicine

## 2018-12-06 DIAGNOSIS — Z1231 Encounter for screening mammogram for malignant neoplasm of breast: Secondary | ICD-10-CM

## 2018-12-07 ENCOUNTER — Ambulatory Visit (INDEPENDENT_AMBULATORY_CARE_PROVIDER_SITE_OTHER): Payer: Self-pay | Admitting: Physician Assistant

## 2018-12-11 ENCOUNTER — Ambulatory Visit
Admission: RE | Admit: 2018-12-11 | Discharge: 2018-12-11 | Disposition: A | Discharge status: Home / Self Care | Payer: No Typology Code available for payment source | Source: Ambulatory Visit | Attending: Internal Medicine | Admitting: Internal Medicine

## 2018-12-11 DIAGNOSIS — Z1231 Encounter for screening mammogram for malignant neoplasm of breast: Secondary | ICD-10-CM

## 2018-12-11 MED FILL — AMLODIPINE BESYLATE 10 MG T: 10 | 30 days supply | Qty: 30 | Fill #4

## 2018-12-11 MED FILL — BUPROPION HCL XL 300 MG TAB: 300 | 30 days supply | Qty: 30 | Fill #2

## 2018-12-14 ENCOUNTER — Telehealth: Payer: Self-pay

## 2018-12-14 NOTE — Telephone Encounter (Signed)
Contacted pt to go over MM results pt is aware and doesn't have any questions or concerns  

## 2018-12-25 ENCOUNTER — Encounter (HOSPITAL_COMMUNITY): Payer: Self-pay | Admitting: Psychiatry

## 2018-12-25 ENCOUNTER — Ambulatory Visit (INDEPENDENT_AMBULATORY_CARE_PROVIDER_SITE_OTHER): Payer: No Typology Code available for payment source | Admitting: Psychiatry

## 2018-12-25 VITALS — BP 122/78 | HR 84 | Ht 66.0 in | Wt 296.0 lb

## 2018-12-25 DIAGNOSIS — F411 Generalized anxiety disorder: Secondary | ICD-10-CM

## 2018-12-25 DIAGNOSIS — F331 Major depressive disorder, recurrent, moderate: Secondary | ICD-10-CM

## 2018-12-25 MED ORDER — ESCITALOPRAM OXALATE 5 MG PO TABS: 5.0000 mg | ORAL_TABLET | Freq: Every day | ORAL | 1 refills | Status: DC

## 2018-12-25 MED FILL — ESCITALOPRAM 5 MG TABLET: 5 | 30 days supply | Qty: 30 | Fill #0

## 2018-12-25 NOTE — Progress Notes (Signed)
BH MD/PA/NP OP Progress Note  12/25/2018 3:19 PM Amy Perez [facility]  MRN:  [id number]  Chief Complaint: I do not think the medicine working.  I did not lost a lot of weight.  I feel it is making him more irritable.  HPI: Patient came for her appointment.  We started her on Wellbutrin but she believes it is making her more irritable and she is also frustrated because she did not lost weight which she was expecting from Wellbutrin.  I explained again that medicine she is getting for anxiety and depression not for weight loss.  Patient feel that she is not doing well.  Sometimes she does not sleep well.  She feels not motivated to do things she does not want to be around people because she gets easily upset.  She apply for disability and waiting further outcome.  She is seeing Patton Salles for therapy.  She lives by herself but sometimes her boyfriend comes and stays.  Patient has no children.  She is using CPAP but admitted some time having issues with the mask.  She denies any paranoia, hallucination, suicidal thoughts or homicidal thought.  She has no delusions.  Energy level is fair.  In the past she had tried Cymbalta but stopped due to weight gain.  She only took twice Zoloft but complained of dizziness and is stopped.  Visit Diagnosis:    ICD-10-CM   1. GAD (generalized anxiety disorder) F41.1 escitalopram (LEXAPRO) 5 MG tablet  2. Moderate episode of recurrent major depressive disorder (HCC) F33.1 escitalopram (LEXAPRO) 5 MG tablet    Past Psychiatric History: Reviewed. H/O depression and anxiety.  Tried Cymbalta up to 60 mg but stopped due to weight gain.  Took Zoloft but stopped after taking 2 doses feeling tired and dizzy.  No h/o inpatient treatment or suicidal attempt.     Past Medical History:  Past Medical History:  Diagnosis Date  . AC (acromioclavicular) joint bone spurs   . Acid reflux   . Anxiety   . Arthritis    knees  . CTS (carpal tunnel syndrome)    Bilateral  .  Depression   . Gastric ulcer   . GERD (gastroesophageal reflux disease)   . Headache   . Heel spur   . History of chicken pox   . Hypertension   . Morbid obesity (HCC)   . Panic attacks   . Plantar fasciitis   . Sickle cell trait (HCC)   . Sleep apnea    uses sometimes  . Tendonitis    Feet    Past Surgical History:  Procedure Laterality Date  . CARPAL TUNNEL RELEASE Right 10/26/2018   Procedure: RIGHT CARPAL TUNNEL RELEASE;  Surgeon: Kathryne Hitch, MD;  Location: [facility];  Service: Orthopedics;  Laterality: Right;  . CHOLECYSTECTOMY    . COLONOSCOPY WITH PROPOFOL N/A 01/09/2018   Procedure: COLONOSCOPY WITH PROPOFOL;  Surgeon: Corbin Ade, MD;  [facility];  Service: Endoscopy;  Laterality: N/A;  8:45am  . TUBAL LIGATION  08/29/04  . WISDOM TOOTH EXTRACTION      Family Psychiatric History: Reviewed  Family History:  Family History  Problem Relation Age of Onset  . Hypertension Father 64       Deceased  . Heart attack Father   . Hypertension Mother        Living  . Arthritis Mother   . Diabetes Mother   . Dementia Mother   . Depression Mother   .  Arthritis Sister        x2  . Lung cancer Sister        Deceased  . Cancer Other        Paternal & Maternal Aunts  . Heart attack Maternal Grandmother   . Neuropathy Sister   . Bipolar disorder Sister   . Schizophrenia Sister   . Heart disease Brother        x1  . Alcohol abuse Brother   . Alcoholism Brother        x2  . Colon cancer Neg Hx     Social History:  Social History   Socioeconomic History  . Marital status: Single    Spouse name: Not on file  . Number of children: 0  . Years of education: Not on file  . Highest education level: Some college, no degree  Occupational History    Comment: not employed   Social Needs  . Financial resource strain: Hard  . Food insecurity:    Worry: Often true    Inability: Often true  . Transportation needs:    Medical:  Yes    Non-medical: Yes  Tobacco Use  . Smoking status: Never Smoker  . Smokeless tobacco: Never Used  Substance and Sexual Activity  . Alcohol use: No  . Drug use: No  . Sexual activity: Not Currently    Birth control/protection: Post-menopausal    Comment: BTL  Lifestyle  . Physical activity:    Days per week: 0 days    Minutes per session: 0 min  . Stress: Not on file  Relationships  . Social connections:    Talks on phone: Not on file    Gets together: Not on file    Attends religious service: More than 4 times per year    Active member of club or organization: No    Attends meetings of clubs or organizations: Never    Relationship status: Never married  Other Topics Concern  . Not on file  Social History Narrative  . Not on file    Allergies:  Allergies  Allergen Reactions  . Benadryl [Diphenhydramine Hcl] Itching  . Milk-Related Compounds Diarrhea  . Oxycodone-Acetaminophen Nausea And Vomiting  . Penicillins Nausea Only    Amoxicillin Has patient had a PCN reaction causing immediate rash, facial/tongue/throat swelling, SOB or lightheadedness with hypotension:__________ Has patient had a PCN reaction causing severe rash involving mucus membranes or skin necrosis:________ Has patient had a PCN reaction that required hospitalization:________ Has patient had a PCN reaction occurring within the last 10 years: ________ If all of the above answers are "NO", then may proceed with Cephalosporin use.     Metabolic Disorder Labs: Lab Results  Component Value Date   HGBA1C 5.4 04/24/2018   No results found for: PROLACTIN Lab Results  Component Value Date   CHOL 245 (H) 09/23/2017   TRIG 124 09/23/2017   HDL 51 09/23/2017   CHOLHDL 4.8 (H) 09/23/2017   LDLCALC 169 (H) 09/23/2017   Lab Results  Component Value Date   TSH 1.830 04/24/2018    Therapeutic Level Labs: No results found for: LITHIUM No results found for: VALPROATE No components found for:   CBMZ  Current Medications: Current Outpatient Medications  Medication Sig Dispense Refill  . amLODipine (NORVASC) 10 MG tablet Take 1 tablet (10 mg total) by mouth daily. 30 tablet 6  . atorvastatin (LIPITOR) 10 MG tablet TAKE 1 TABLET BY MOUTH DAILY. 30 tablet 2  . buPROPion (WELLBUTRIN XL)  300 MG 24 hr tablet Take 1 tablet (300 mg total) by mouth daily. 30 tablet 2  . fluticasone (FLONASE) 50 MCG/ACT nasal spray Place 2 sprays into both nostrils daily. (Patient taking differently: Place 2 sprays into both nostrils as needed. ) 16 g 0  . HYDROcodone-acetaminophen (NORCO/VICODIN) 5-325 MG tablet Take 1-2 tablets by mouth every 6 (six) hours as needed for moderate pain. 40 tablet 0  . loratadine (CLARITIN) 10 MG tablet Take 1 tablet (10 mg total) by mouth daily. 30 tablet 2  . losartan (COZAAR) 25 MG tablet Take 1 tablet (25 mg total) by mouth daily. 30 tablet 6  . meloxicam (MOBIC) 15 MG tablet Take 1 tablet (15 mg total) by mouth daily. 30 tablet 6  . Multiple Vitamin (MULTIVITAMIN) tablet Take 1 tablet by mouth daily. Reported on 04/28/2016    . pantoprazole (PROTONIX) 40 MG tablet TAKE 1 TABLET BY MOUTH DAILY. 30 tablet 2  . polyethylene glycol powder (GLYCOLAX/MIRALAX) powder   5  . diclofenac sodium (VOLTAREN) 1 % GEL Apply 2 g topically 4 (four) times daily. (Patient not taking: Reported on 12/25/2018) 100 g 0  . traZODone (DESYREL) 50 MG tablet Take 1-2 tab at bed time (Patient not taking: Reported on 12/25/2018) 30 tablet 0   No current facility-administered medications for this visit.      Musculoskeletal: Strength & Muscle Tone: within normal limits Gait & Station: normal Patient leans: N/A  Psychiatric Specialty Exam: ROS  Blood pressure 122/78, pulse 84, height 5\' 6"  (1.676 m), weight 296 lb (134.3 kg), last menstrual period 12/14/2017, SpO2 99 %.Body mass index is 47.78 kg/m.  General Appearance: Casual  Eye Contact:  Good  Speech:  Clear and Coherent  Volume:  Normal   Mood:  Depressed  Affect:  Appropriate  Thought Process:  Goal Directed  Orientation:  Full (Time, Place, and Person)  Thought Content: Rumination   Suicidal Thoughts:  No  Homicidal Thoughts:  No  Memory:  Immediate;   Good Recent;   Good Remote;   Good  Judgement:  Fair  Insight:  Fair  Psychomotor Activity:  Decreased  Concentration:  Concentration: Fair and Attention Span: Fair  Recall:  Good  Fund of Knowledge: Good  Language: Good  Akathisia:  No  Handed:  Right  AIMS (if indicated): not done  Assets:  Communication Skills Desire for Improvement Housing Resilience  ADL's:  Intact  Cognition: WNL  Sleep:  Fair   Screenings: GAD-7     Office Visit from 11/30/2018 in [facility] Office Visit from 04/24/2018 in [facility] Office Visit from 12/23/2017 in [facility] Office Visit from 11/08/2017 in [facility] Office Visit from 09/23/2017 in [facility]  Total GAD-7 Score  14  14  14  12  14     PHQ2-9     Office Visit from 11/30/2018 in [facility] Office Visit from 04/24/2018 in [facility] Office Visit from 12/23/2017 in [facility] Office Visit from 11/08/2017 in [facility] Office Visit from 09/23/2017 in [facility]  PHQ-2 Total Score  4  4  4  2  4   PHQ-9 Total Score  18  18  18  16  18        Assessment and Plan: Major depressive  disorder, recurrent.  Generalized anxiety disorder.  I will discontinue Wellbutrin because patient do not see any improvement and actually feeling more irritability.  She admitted not able to do exercise because she feels not motivated.  In the past we have recommended gene site testing but she canceled because she does not want to get bill from the company  but now she agreed to do gene site testing.  We will do gene site testing.  I recommended to try Lexapro 5 mg only however reminded that medicine has side effects including metabolic syndrome, tremors and shakes.  Unless she does not try the medication for at least 3 to 4 weeks no one can predict the outcome.  Encouraged to continue Patton SallesAnita Jones with therapy.  Recommended to call us back if she is any question or any concern.  Follow-up in 6 weeks.   Cleotis NipperSyed T Lanice Folden, MD 12/25/2018, 3:19 PM

## 2018-12-27 ENCOUNTER — Other Ambulatory Visit: Payer: Self-pay | Admitting: Internal Medicine

## 2018-12-27 MED FILL — MELOXICAM 15 MG TABLET: 15 | 30 days supply | Qty: 30 | Fill #1

## 2018-12-27 MED FILL — LOSARTAN POTASSIUM 25 MG TA: 25 | 30 days supply | Qty: 30 | Fill #1

## 2018-12-27 MED FILL — PANTOPRAZOLE SOD DR 40 MG T: 40 | 30 days supply | Qty: 30 | Fill #2

## 2018-12-27 MED FILL — ?ATORVASTATIN 10 MG TABLET: 10 | 30 days supply | Qty: 30 | Fill #0

## 2018-12-29 ENCOUNTER — Ambulatory Visit (INDEPENDENT_AMBULATORY_CARE_PROVIDER_SITE_OTHER): Payer: Self-pay | Admitting: Pulmonary Disease

## 2018-12-29 ENCOUNTER — Ambulatory Visit: Payer: No Typology Code available for payment source | Admitting: Registered"

## 2018-12-29 ENCOUNTER — Encounter: Payer: Self-pay | Admitting: Pulmonary Disease

## 2018-12-29 ENCOUNTER — Telehealth: Payer: Self-pay | Admitting: Internal Medicine

## 2018-12-29 VITALS — BP 126/76 | HR 78 | Ht 66.0 in | Wt 295.0 lb

## 2018-12-29 DIAGNOSIS — Z9989 Dependence on other enabling machines and devices: Secondary | ICD-10-CM

## 2018-12-29 DIAGNOSIS — G4733 Obstructive sleep apnea (adult) (pediatric): Secondary | ICD-10-CM

## 2018-12-29 NOTE — Patient Instructions (Signed)
Obstructive sleep apnea Well treated with CPAP therapy with very good compliance  Continue CPAP on a regular basis Continue weight loss efforts  I will see her back in the office in 6 months

## 2018-12-29 NOTE — Telephone Encounter (Signed)
Will forward to Jane 

## 2018-12-29 NOTE — Progress Notes (Signed)
Amy Perez [facility]    [id number]    05-12-1966  Primary Care Physician:Johnson, Binnie Rail, MD  Referring Physician: Marcine Matar, MD [address] [place], [place] [address]  Chief complaint:   Patient with a history of obstructive sleep apnea Has been doing well since her last visit  HPI:  Currently on CPAP of 15 Tolerating it well Improved symptoms Has started exercising regularly  Diagnoses obstructive sleep apnea and titrated to a pressure of 18-pressure was reduced to 15 during last visit and is better tolerated She also did have to change our mask  Stable bedtime, sometimes to go to bed at 3 PM Wakes up a few times during the night Wakes up different times in the mornings  Has been gaining weight History of PTSD, history of depression History of morbid obesity Hypertension   Outpatient Encounter Medications as of 12/29/2018  Medication Sig  . amLODipine (NORVASC) 10 MG tablet Take 1 tablet (10 mg total) by mouth daily.  Marland Kitchen atorvastatin (LIPITOR) 10 MG tablet TAKE 1 TABLET BY MOUTH DAILY.  Marland Kitchen diclofenac sodium (VOLTAREN) 1 % GEL Apply 2 g topically 4 (four) times daily.  Marland Kitchen escitalopram (LEXAPRO) 5 MG tablet Take 1 tablet (5 mg total) by mouth daily.  . fluticasone (FLONASE) 50 MCG/ACT nasal spray Place 2 sprays into both nostrils daily. (Patient taking differently: Place 2 sprays into both nostrils as needed. )  . HYDROcodone-acetaminophen (NORCO/VICODIN) 5-325 MG tablet Take 1-2 tablets by mouth every 6 (six) hours as needed for moderate pain.  Marland Kitchen loratadine (CLARITIN) 10 MG tablet Take 1 tablet (10 mg total) by mouth daily.  Marland Kitchen losartan (COZAAR) 25 MG tablet Take 1 tablet (25 mg total) by mouth daily.  . meloxicam (MOBIC) 15 MG tablet Take 1 tablet (15 mg total) by mouth daily.  . Multiple Vitamin (MULTIVITAMIN) tablet Take 1 tablet by mouth daily. Reported on 04/28/2016  . pantoprazole (PROTONIX) 40 MG tablet TAKE 1 TABLET BY MOUTH DAILY.  Marland Kitchen  polyethylene glycol powder (GLYCOLAX/MIRALAX) powder   . traZODone (DESYREL) 50 MG tablet Take 1-2 tab at bed time   No facility-administered encounter medications on file as of 12/29/2018.     Allergies as of 12/29/2018 - Review Complete 12/29/2018  Allergen Reaction Noted  . Benadryl [diphenhydramine hcl] Itching 11/06/2012  . Milk-related compounds Diarrhea 09/22/2016  . Oxycodone-acetaminophen Nausea And Vomiting 02/27/2007  . Penicillins Nausea Only 02/27/2007    Past Medical History:  Diagnosis Date  . AC (acromioclavicular) joint bone spurs   . Acid reflux   . Anxiety   . Arthritis    knees  . CTS (carpal tunnel syndrome)    Bilateral  . Depression   . Gastric ulcer   . GERD (gastroesophageal reflux disease)   . Headache   . Heel spur   . History of chicken pox   . Hypertension   . Morbid obesity (HCC)   . Panic attacks   . Plantar fasciitis   . Sickle cell trait (HCC)   . Sleep apnea    uses sometimes  . Tendonitis    Feet    Past Surgical History:  Procedure Laterality Date  . CARPAL TUNNEL RELEASE Right 10/26/2018   Procedure: RIGHT CARPAL TUNNEL RELEASE;  Surgeon: Kathryne Hitch, MD;  Location: [facility];  Service: Orthopedics;  Laterality: Right;  . CHOLECYSTECTOMY    . COLONOSCOPY WITH PROPOFOL N/A 01/09/2018   Procedure: COLONOSCOPY WITH PROPOFOL;  Surgeon: Corbin Ade, MD;  [facility];  Service: Endoscopy;  Laterality: N/A;  8:45am  . TUBAL LIGATION  08/29/04  . WISDOM TOOTH EXTRACTION      Family History  Problem Relation Age of Onset  . Hypertension Father 8       Deceased  . Heart attack Father   . Hypertension Mother        Living  . Arthritis Mother   . Diabetes Mother   . Dementia Mother   . Depression Mother   . Arthritis Sister        x2  . Lung cancer Sister        Deceased  . Cancer Other        Paternal & Maternal Aunts  . Heart attack Maternal Grandmother   . Neuropathy Sister   .  Bipolar disorder Sister   . Schizophrenia Sister   . Heart disease Brother        x1  . Alcohol abuse Brother   . Alcoholism Brother        x2  . Colon cancer Neg Hx     Social History   Socioeconomic History  . Marital status: Single    Spouse name: Not on file  . Number of children: 0  . Years of education: Not on file  . Highest education level: Some college, no degree  Occupational History    Comment: not employed   Social Needs  . Financial resource strain: Hard  . Food insecurity:    Worry: Often true    Inability: Often true  . Transportation needs:    Medical: Yes    Non-medical: Yes  Tobacco Use  . Smoking status: Never Smoker  . Smokeless tobacco: Never Used  Substance and Sexual Activity  . Alcohol use: No  . Drug use: No  . Sexual activity: Not Currently    Birth control/protection: Post-menopausal    Comment: BTL  Lifestyle  . Physical activity:    Days per week: 0 days    Minutes per session: 0 min  . Stress: Not on file  Relationships  . Social connections:    Talks on phone: Not on file    Gets together: Not on file    Attends religious service: More than 4 times per year    Active member of club or organization: No    Attends meetings of clubs or organizations: Never    Relationship status: Never married  . Intimate partner violence:    Fear of current or ex partner: No    Emotionally abused: No    Physically abused: No    Forced sexual activity: No  Other Topics Concern  . Not on file  Social History Narrative  . Not on file    Review of Systems  Constitutional: Negative.   HENT: Negative.   Eyes: Negative.   Respiratory: Positive for apnea.   Cardiovascular: Negative.   Gastrointestinal: Negative.   Psychiatric/Behavioral: Positive for sleep disturbance.  All other systems reviewed and are negative.   Vitals:   12/29/18 1452  BP: 126/76  Pulse: 78  SpO2: 98%     Physical Exam  Constitutional: She appears  well-developed and well-nourished.  HENT:  Head: Normocephalic and atraumatic.  Mallampati 3  Eyes: Pupils are equal, round, and reactive to light. Conjunctivae and EOM are normal.  Neck: Normal range of motion. Neck supple. No tracheal deviation present. No thyromegaly present.  Cardiovascular: Normal rate, regular rhythm and  normal heart sounds.  Pulmonary/Chest: Effort normal and breath sounds normal. No respiratory distress. She has no wheezes.  Abdominal: Soft. Bowel sounds are normal. She exhibits no distension. There is no abdominal tenderness.   Sleep study titration did reveal pressure requirement of 18  Results of the Epworth flowsheet 12/29/2018 09/28/2018  Sitting and reading 0 3  Watching TV 3 3  Sitting, inactive in a public place (e.g. a theatre or a meeting) 0 3  As a passenger in a car for an hour without a break 0 0  Lying down to rest in the afternoon when circumstances permit 0 3  Sitting and talking to someone 0 0  Sitting quietly after a lunch without alcohol 0 0  In a car, while stopped for a few minutes in traffic 0 0  Total score 3 12    Assessment:  Obstructive sleep apnea -CPAP is better tolerated -Functioning much better  Plan/Recommendations: Continue CPAP 15  Follow-up with downloads from the machine to assess efficiency  Encouraged to continue working on weight loss and regular exercises  I will see her back in the office in about 6 months Call with any significant concerns   Virl Diamond MD [facility] 12/29/2018, 3:08 PM  CC: Marcine Matar, MD

## 2018-12-29 NOTE — Telephone Encounter (Signed)
Patient called because she would like to know what she needs to do or who she needs to speak to in regards to getting a cpap mask. Patient states she has the machine. Please follow up.

## 2019-01-01 ENCOUNTER — Ambulatory Visit: Payer: No Typology Code available for payment source | Admitting: Registered"

## 2019-01-01 ENCOUNTER — Telehealth: Payer: Self-pay | Admitting: Internal Medicine

## 2019-01-01 NOTE — Telephone Encounter (Signed)
Call placed to the patient.  She stated that the initial mask that she received at her sleep study did not fit correctly.  She was then re-fitted for another mask and was told that she needed to get a new one in March.  She wanted to know what resources are available as she does not have a job.   Explained to her that [facility] does not pay for additional masks for patients   She is eligible for one from the [employer] ([employer]AA) CPAP assistance program but she would have to pay for it - $25.  They will only provide the same mask that was initially ordered. But she can call them to inquire if she can purchase something different.  Provided her with the phone # for [employer] # [phone].

## 2019-01-01 NOTE — Telephone Encounter (Signed)
Call placed to patient.  She said that she spoke to the [employer] about obtaining a new CPAP mask and they told her that they do not carry the kind of mask that she needs.  She said that she then called [facility] and they told her that they could get a mask for her but they need a prescription. The patient said that they did not tell her how much / if anything the mask would cost. She is requesting that an order for the mask - medium size [employer] Simplus nasal -oral mask be sent to the [facility].  Informed her that Dr Laural Benes would be notified of the request.

## 2019-01-01 NOTE — Telephone Encounter (Signed)
New Message   Pt is calling states has questions about her cpap matching. Please follow up

## 2019-01-02 ENCOUNTER — Telehealth: Payer: Self-pay

## 2019-01-02 NOTE — Telephone Encounter (Signed)
Call placed to [facility] to inquire about order for new CPAP mask for patient.  Spoke to [place] and explained that the patient is requesting another CPAP mask.  She just wants a second mask like the most recent one she received.  Marita Kansas stated that the order could be faxed to his attention - fax # [phone] and the patient can stop by to see him  M/T/W [phone] and he will see what he has that he can give her. He stated that she should bring her current mask with her when she comes to see him.  He also noted that there would be no charge for the mask.   Call placed to the patient and informed her of above noted conversation with Marita Kansas.    Prescription for mask faxed to [facility].

## 2019-01-10 ENCOUNTER — Ambulatory Visit (INDEPENDENT_AMBULATORY_CARE_PROVIDER_SITE_OTHER): Payer: Self-pay | Admitting: Physician Assistant

## 2019-01-11 ENCOUNTER — Ambulatory Visit: Payer: Self-pay | Admitting: Skilled Nursing Facility1

## 2019-01-11 MED FILL — AMLODIPINE BESYLATE 10 MG T: 10 | 30 days supply | Qty: 30 | Fill #5

## 2019-01-17 ENCOUNTER — Other Ambulatory Visit: Payer: Self-pay | Admitting: Internal Medicine

## 2019-01-17 DIAGNOSIS — K219 Gastro-esophageal reflux disease without esophagitis: Secondary | ICD-10-CM

## 2019-01-17 MED FILL — MELOXICAM 15 MG TABLET: 15 | 30 days supply | Qty: 30 | Fill #2

## 2019-01-17 MED FILL — LOSARTAN POTASSIUM 25 MG TA: 25 | 30 days supply | Qty: 30 | Fill #2

## 2019-01-17 MED FILL — ?ATORVASTATIN 10 MG TABLET: 10 | 30 days supply | Qty: 30 | Fill #1

## 2019-01-17 MED FILL — ESCITALOPRAM 5 MG TABLET: 5 | 30 days supply | Qty: 30 | Fill #1

## 2019-01-23 ENCOUNTER — Telehealth: Payer: Self-pay | Admitting: Internal Medicine

## 2019-01-23 NOTE — Telephone Encounter (Signed)
Please schedule pt a televisit with angela for tomorrow

## 2019-01-23 NOTE — Telephone Encounter (Signed)
Patient called because she would like to speak with nurse in regards to some symptoms she is having with her toes.She would like to get a prescription for her toes and for sinus as well for allergies. Please follow up.  Advised to go to the ED or UC

## 2019-01-23 NOTE — Telephone Encounter (Signed)
Pt is scheduled for a televist for 01/24/19 @130 

## 2019-01-24 ENCOUNTER — Ambulatory Visit: Payer: Self-pay | Attending: Internal Medicine | Admitting: Physician Assistant

## 2019-01-24 ENCOUNTER — Other Ambulatory Visit: Payer: Self-pay

## 2019-01-24 DIAGNOSIS — I10 Essential (primary) hypertension: Secondary | ICD-10-CM

## 2019-01-24 DIAGNOSIS — B351 Tinea unguium: Secondary | ICD-10-CM

## 2019-01-24 DIAGNOSIS — T7840XD Allergy, unspecified, subsequent encounter: Secondary | ICD-10-CM

## 2019-01-24 DIAGNOSIS — M79674 Pain in right toe(s): Secondary | ICD-10-CM

## 2019-01-24 MED ORDER — FLUTICASONE PROPIONATE 50 MCG/ACT NA SUSP: 2.0000 | NASAL | Status: DC | PRN

## 2019-01-24 MED ORDER — CETIRIZINE HCL 10 MG PO TABS: 10.0000 mg | ORAL_TABLET | Freq: Every day | ORAL | 11 refills | Status: DC

## 2019-01-24 MED ORDER — CICLOPIROX 8 % EX KIT: 1.0000 | PACK | CUTANEOUS | 1 refills | Status: AC

## 2019-01-24 NOTE — Progress Notes (Signed)
Patient ID: Amy Perez [facility], female   DOB: 04-25-1966, 53 y.o.   MRN: [id number]    Virtual Visit via Telephone Note  I connected with Shari Natt [facility] on 01/24/19 at  1:30 PM EDT by telephone and verified that I am speaking with the correct person using two identifiers.   I discussed the limitations, risks, security and privacy concerns of performing an evaluation and management service by telephone and the availability of in person appointments. I also discussed with the patient that there may be a patient responsible charge related to this service. The patient expressed understanding and agreed to proceed. I am in my office at [facility].  Covid-19 precautions being taken.   Location of patient:  home Patient Consented to phone visit: yes  Participating persons: patient only Time on call:  6 mins   History of Present Illness: Toenail fungus and would like to use topical treatment she has used in the past and provided me with the name of it.  Also having itchy eyes, sneezing.  +allergies.  No fevers.  No cough.  No covid exposure.  No travel.  claritin not helping.    BP out of office controlled    Observations/Objective: TP linear.     Assessment and Plan: 1. Essential hypertension Controlled.  Continue current regimen  2. Nail fungus - Ciclopirox 8 % KIT; Apply 1 each topically 1 day or 1 dose for 1 dose.  Dispense: 1 each; Refill: 1  3. Allergic state, subsequent encounter - fluticasone (FLONASE) 50 MCG/ACT nasal spray; Place 2 sprays into both nostrils as needed. - cetirizine (ZYRTEC) 10 MG tablet; Take 1 tablet (10 mg total) by mouth daily.  Dispense: 30 tablet; Refill: 11   Follow Up Instructions: F/up 2 months with PCP.  Sooner if needed.     I discussed the assessment and treatment plan with the patient. The patient was provided an opportunity to ask questions and all were answered. The patient agreed with the plan and demonstrated an understanding of  the instructions.   The patient was advised to call back or seek an in-person evaluation if the symptoms worsen or if the condition fails to improve as anticipated.  I provided 6 minutes of non-face-to-face time during this encounter.   Freeman Caldron, PA-C

## 2019-01-24 NOTE — Progress Notes (Signed)
Pt states her right big toe is sore

## 2019-01-25 ENCOUNTER — Other Ambulatory Visit: Payer: Self-pay | Admitting: Internal Medicine

## 2019-01-25 DIAGNOSIS — K219 Gastro-esophageal reflux disease without esophagitis: Secondary | ICD-10-CM

## 2019-01-25 MED FILL — ?CETIRIZINE HCL 10 MG TABLE: 10 | 30 days supply | Qty: 30 | Fill #0

## 2019-01-25 MED FILL — ?PANTOPRAZOLE SOD DR 40MGTA: 40 | 30 days supply | Qty: 30 | Fill #0

## 2019-01-25 MED FILL — ?AMLODIPINE BESYLATE 10 MG: 10 | 30 days supply | Qty: 30 | Fill #6

## 2019-01-29 ENCOUNTER — Other Ambulatory Visit: Payer: Self-pay | Admitting: Pharmacist

## 2019-01-29 ENCOUNTER — Other Ambulatory Visit: Payer: Self-pay

## 2019-01-29 MED ORDER — CICLOPIROX 8 % EX SOLN: Freq: Every day | CUTANEOUS | 0 refills | Status: DC

## 2019-01-29 MED FILL — CICLOPIROX 8% SOLUTION: 8 | 30 days supply | Qty: 7 | Fill #0

## 2019-02-15 ENCOUNTER — Other Ambulatory Visit (HOSPITAL_COMMUNITY): Payer: Self-pay | Admitting: Psychiatry

## 2019-02-15 DIAGNOSIS — F411 Generalized anxiety disorder: Secondary | ICD-10-CM

## 2019-02-15 DIAGNOSIS — F331 Major depressive disorder, recurrent, moderate: Secondary | ICD-10-CM

## 2019-02-15 MED FILL — MELOXICAM 15 MG TABLET: 15 | 30 days supply | Qty: 30 | Fill #3

## 2019-02-15 MED FILL — ?PANTOPRAZOLE SOD DR 40MGTA: 40 | 30 days supply | Qty: 30 | Fill #1

## 2019-02-15 MED FILL — ?ATORVASTATIN 10 MG TABLET: 10 | 30 days supply | Qty: 30 | Fill #2

## 2019-02-15 MED FILL — LOSARTAN POTASSIUM 25 MG TA: 25 | 30 days supply | Qty: 30 | Fill #3

## 2019-02-20 ENCOUNTER — Telehealth: Payer: Self-pay | Admitting: Internal Medicine

## 2019-02-20 ENCOUNTER — Encounter: Payer: Self-pay | Admitting: Internal Medicine

## 2019-02-20 ENCOUNTER — Other Ambulatory Visit: Payer: Self-pay

## 2019-02-20 ENCOUNTER — Ambulatory Visit: Payer: Self-pay | Admitting: Nurse Practitioner

## 2019-02-20 ENCOUNTER — Telehealth: Payer: Self-pay

## 2019-02-20 NOTE — Telephone Encounter (Signed)
PATIENT WAS A NO ANSWER AND LETTER SENT

## 2019-02-20 NOTE — Telephone Encounter (Signed)
Tried to call pt at 10:03am to start telephone visit w/EG, no answer, LMOVM for return call. Pt didn't call back by 10:45am. Please no show for telephone visit.

## 2019-02-20 NOTE — Progress Notes (Signed)
Patient no show or cancelled 

## 2019-02-21 ENCOUNTER — Other Ambulatory Visit (HOSPITAL_COMMUNITY): Payer: Self-pay

## 2019-02-21 DIAGNOSIS — F331 Major depressive disorder, recurrent, moderate: Secondary | ICD-10-CM

## 2019-02-21 DIAGNOSIS — F411 Generalized anxiety disorder: Secondary | ICD-10-CM

## 2019-02-21 MED ORDER — ESCITALOPRAM OXALATE 5 MG PO TABS: 5.0000 mg | ORAL_TABLET | Freq: Every day | ORAL | 0 refills | Status: DC

## 2019-02-21 MED FILL — ESCITALOPRAM 5 MG TABLET: 5 | 30 days supply | Qty: 30 | Fill #0

## 2019-02-22 NOTE — Telephone Encounter (Signed)
DONE

## 2019-02-27 ENCOUNTER — Ambulatory Visit (INDEPENDENT_AMBULATORY_CARE_PROVIDER_SITE_OTHER): Payer: No Typology Code available for payment source | Admitting: Psychiatry

## 2019-02-27 ENCOUNTER — Encounter (HOSPITAL_COMMUNITY): Payer: Self-pay | Admitting: Psychiatry

## 2019-02-27 ENCOUNTER — Other Ambulatory Visit: Payer: Self-pay

## 2019-02-27 DIAGNOSIS — F331 Major depressive disorder, recurrent, moderate: Secondary | ICD-10-CM

## 2019-02-27 DIAGNOSIS — F411 Generalized anxiety disorder: Secondary | ICD-10-CM

## 2019-02-27 MED ORDER — ESCITALOPRAM OXALATE 10 MG PO TABS: 10.0000 mg | ORAL_TABLET | Freq: Every day | ORAL | 1 refills | Status: DC

## 2019-02-27 NOTE — Progress Notes (Signed)
Virtual Visit via Telephone Note  I connected with Sugar Mutchler [facility] on 02/27/19 at  3:20 PM EDT by telephone and verified that I am speaking with the correct person using two identifiers.   I discussed the limitations, risks, security and privacy concerns of performing an evaluation and management service by telephone and the availability of in person appointments. I also discussed with the patient that there may be a patient responsible charge related to this service. The patient expressed understanding and agreed to proceed.   History of Present Illness: Patient evaluated through phone session.  She is now taking Lexapro 5 mg and tolerating better.  She admitted it is helping her anxiety and irritability.  She is able to go outside and there a few times she went to the grocery store without any issues.  However she is keeping a social distancing due to pandemic coronavirus.  We have done gene site testing but the results are not available.  We will follow-up.  She is using CPAP which is helping her sleep.  She is no longer taking trazodone because CPAP is helping her sleep.  She denies any crying spells or any feeling of hopelessness.  She denies any paranoia.  She denies any tremors or shakes.  She admitted her appetite is okay and does not see any significant weight gain with this new medication.  We discussed increasing the dose since he is still have some residual depression and anxiety.  She agreed with that.  She like Lexapro better than any other medication as she does not have any dizziness or any other concern.   Past Psychiatric History: Reviewed. H/O depression and anxiety.  Tried Cymbalta up to 60 mg but stopped due to weight gain.  Took Zoloft but stopped after taking 2 doses feeling tired and dizzy.  Noh/o inpatient treatment or suicidal attempt. Recommended trazodone but does not want to take it due to CPAP.  Observations/Objective:   Assessment and Plan: Major  depressive disorder, recurrent.  Generalized anxiety disorder.  Patient showing some improvement with the Lexapro.  She is tolerating very well and she noticed her less anxious and less irritability but is still have depressive symptoms she does not leave the house and sometimes feel unmotivated.  I recommend to try Lexapro 10 mg.  I will discontinue trazodone as patient has never taken it and afraid to take any sleep medication as using CPAP.  Encouraged to see her therapist Patton Salles for CBT.  Discussed medication side effects and benefits.  We will follow-up on her gene site testing results.  I recommend to call us back if she has any question or any concern.  Follow-up in 2 months.  Follow Up Instructions:    I discussed the assessment and treatment plan with the patient. The patient was provided an opportunity to ask questions and all were answered. The patient agreed with the plan and demonstrated an understanding of the instructions.   The patient was advised to call back or seek an in-person evaluation if the symptoms worsen or if the condition fails to improve as anticipated.  I provided 20 minutes of non-face-to-face time during this encounter.   Cleotis Nipper, MD

## 2019-03-01 ENCOUNTER — Ambulatory Visit: Payer: Self-pay | Admitting: Internal Medicine

## 2019-03-05 MED FILL — ?CETIRIZINE HCL 10 MG TABLE: 10 | 90 days supply | Qty: 90 | Fill #1

## 2019-03-05 MED FILL — LOSARTAN POTASSIUM 25 MG TA: 25 | 30 days supply | Qty: 30 | Fill #4

## 2019-03-07 IMAGING — MG DIGITAL SCREENING BILATERAL MAMMOGRAM WITH TOMO AND CAD
6 of 10 series · 6 of 30 positions shown · non-contrast
Comparison: Previous exam(s).

CLINICAL DATA: Screening.

EXAM:
DIGITAL SCREENING BILATERAL MAMMOGRAM WITH TOMO AND CAD

[R CC synth-2D]
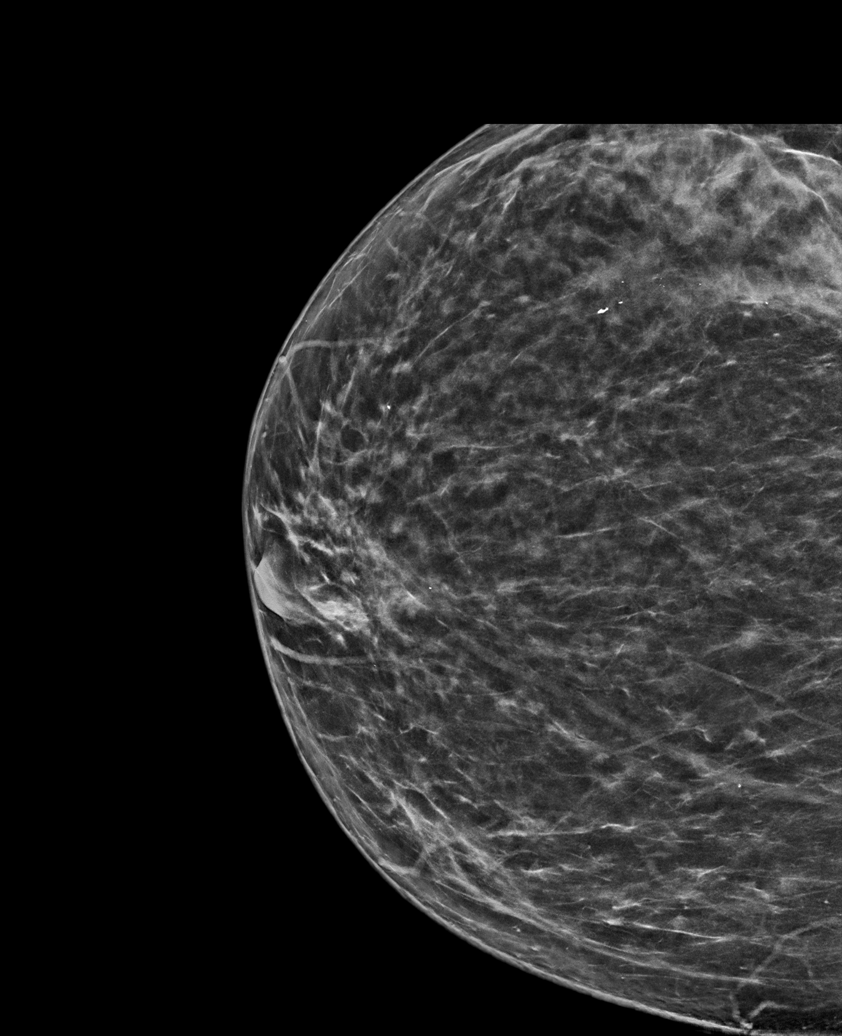

[R MLO synth-2D]
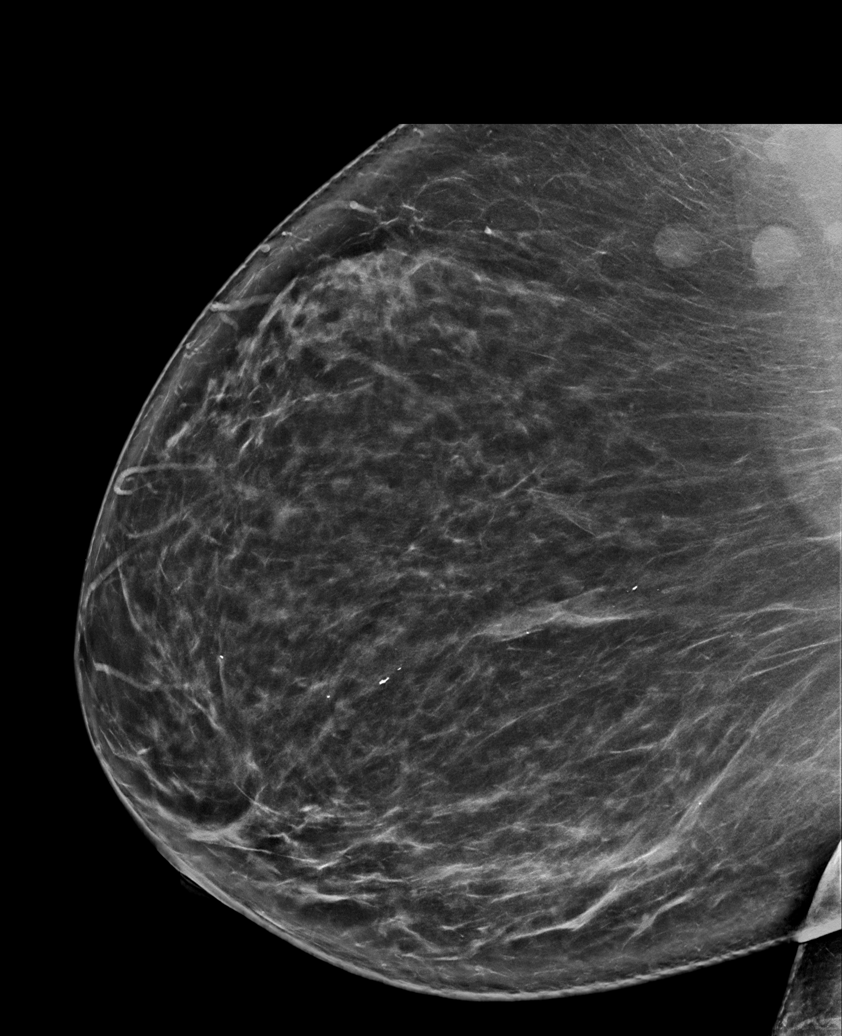

[L MLO synth-2D]
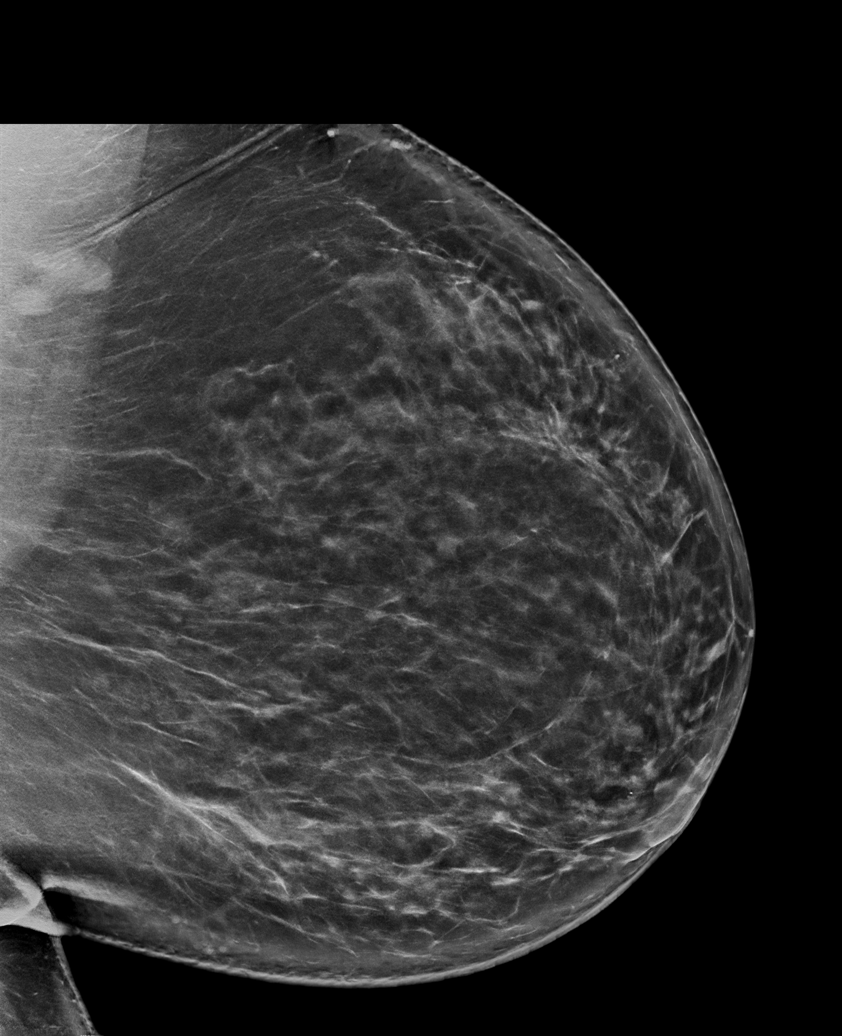

[L CC synth-2D]
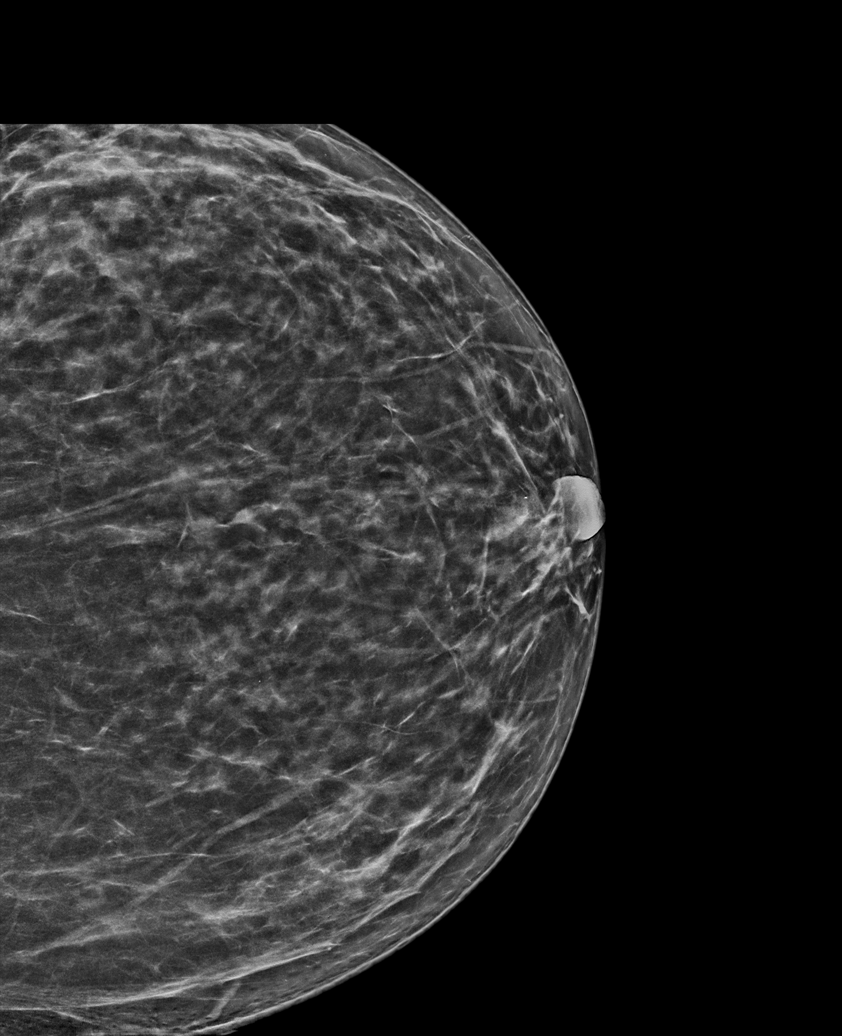

[L CV synth-2D]
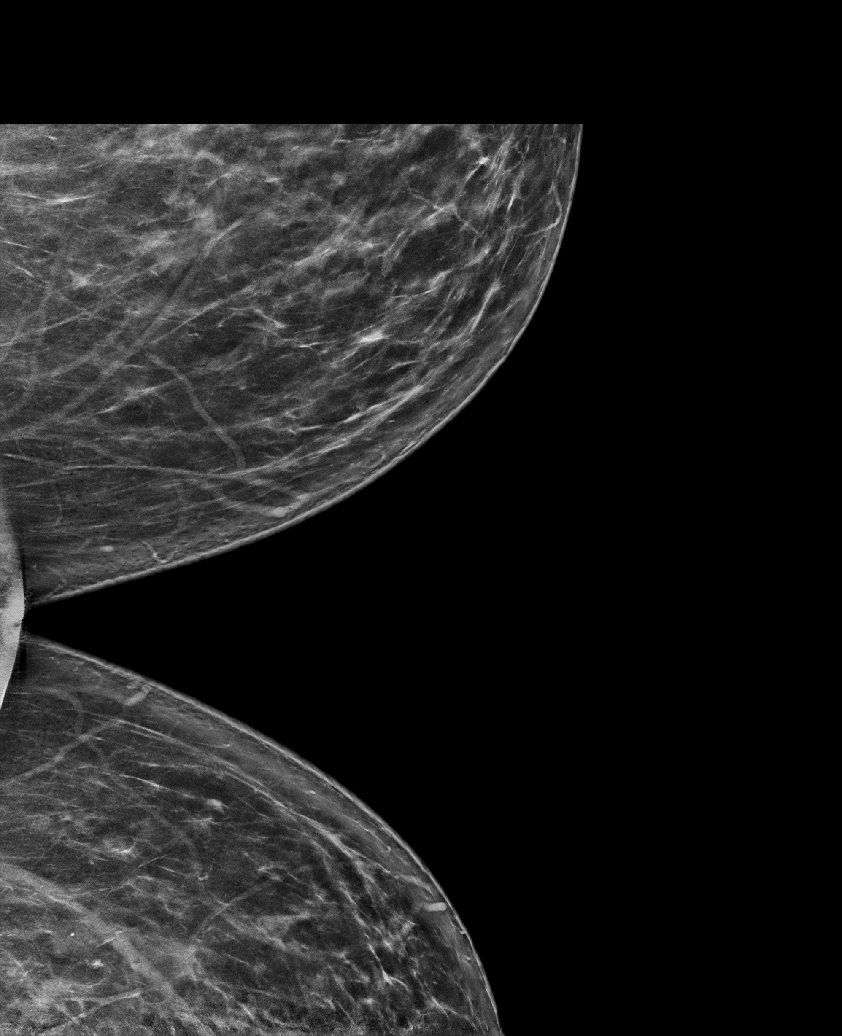

[R MLO tomo · tomo slice 51/100.0]
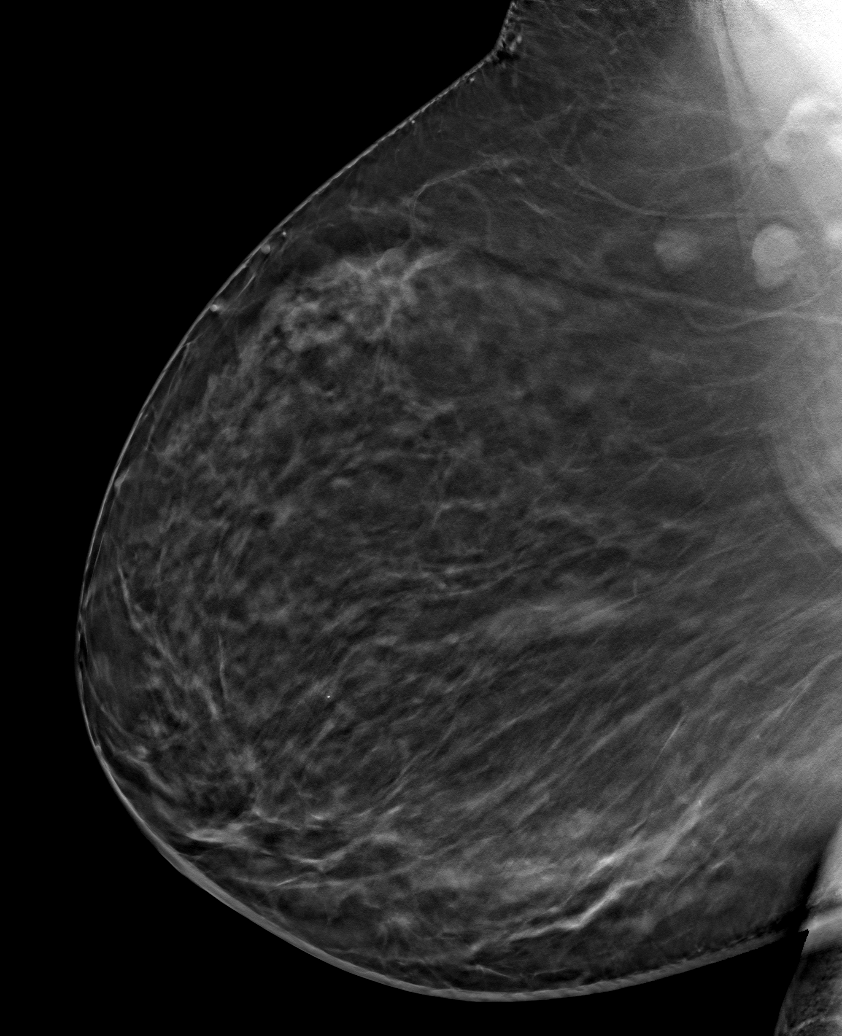

[6 of 30 positions shown; findings below may reference images not displayed]

ACR Breast Density Category b: There are scattered areas of
fibroglandular density.
FINDINGS: There are no findings suspicious for malignancy. Images were
processed with CAD.
IMPRESSION: No mammographic evidence of malignancy. A result letter of this
screening mammogram will be mailed directly to the patient.

RECOMMENDATION:
Screening mammogram in one year. (Code:[id number])

BI-RADS CATEGORY  1: Negative.

## 2019-03-13 ENCOUNTER — Other Ambulatory Visit: Payer: Self-pay | Admitting: Internal Medicine

## 2019-03-13 DIAGNOSIS — I10 Essential (primary) hypertension: Secondary | ICD-10-CM

## 2019-03-13 MED FILL — AMLODIPINE BESYLATE 10 MG T: 10 | 30 days supply | Qty: 30 | Fill #0

## 2019-03-28 ENCOUNTER — Telehealth: Payer: Self-pay

## 2019-03-28 NOTE — Telephone Encounter (Signed)
Pt states her cpap machine donated. She doesn't want to pay for a mask She will continue with what she has. Nothing further needed.

## 2019-03-29 MED FILL — ESCITALOPRAM 10 MG TABLET: 10 | 30 days supply | Qty: 30 | Fill #0

## 2019-03-30 ENCOUNTER — Telehealth (HOSPITAL_COMMUNITY): Payer: Self-pay

## 2019-03-30 ENCOUNTER — Other Ambulatory Visit: Payer: Self-pay | Admitting: Internal Medicine

## 2019-03-30 MED FILL — ?ATORVASTATIN 10 MG TABLET: 10 | 30 days supply | Qty: 30 | Fill #0

## 2019-03-30 MED FILL — ?PANTOPRAZOLE SODI DR 40MGT: 40 | 30 days supply | Qty: 30 | Fill #2

## 2019-03-30 MED FILL — MELOXICAM 15 MG TABLET: 15 | 30 days supply | Qty: 30 | Fill #4

## 2019-03-30 NOTE — Telephone Encounter (Signed)
Patient left a message that she would like to speak with you, patient refused to give details, saying she needed to talk to you. Please review and advise, thank you

## 2019-04-02 NOTE — Telephone Encounter (Signed)
I returned patient's phone call.  She did not pick up the phone and left a message to call us back.

## 2019-04-09 ENCOUNTER — Encounter: Payer: Self-pay | Admitting: Physician Assistant

## 2019-04-09 ENCOUNTER — Ambulatory Visit (INDEPENDENT_AMBULATORY_CARE_PROVIDER_SITE_OTHER): Payer: Self-pay | Admitting: Physician Assistant

## 2019-04-09 ENCOUNTER — Other Ambulatory Visit: Payer: Self-pay

## 2019-04-09 DIAGNOSIS — M65331 Trigger finger, right middle finger: Secondary | ICD-10-CM

## 2019-04-09 NOTE — Progress Notes (Signed)
HPI: Ms. Hugill returns today status post right carpal tunnel release morning 12/14/2018.  She states that overall her numbness tingling is gone.  She still has achiness and some itching in the hand.  She is also developed locking of her right middle finger.  No known injury.  She also notes a nodule at the thumb joint of the right hand.  Review of systems: See HPI.  Denies any fevers or chills.  Physical exam: General well-developed well-nourished female no acute distress mood and affect appropriate Right hand surgical incisions healed well however she did develop a bit of keloid.  She has good range of motion of the fingers.  And sensation intact throughout.  There is a nodule consistent with a ganglion cyst at the IP joint of the thumb dorsal aspect.  Tenderness over the right middle finger A1 pulley.  No active triggering.  Hand otherwise well perfused.  Impression: Status post right carpal tunnel release 6 months Right middle trigger finger  Plan: Discussed with her working on scar tissue mobilization.  Offered her cortisone injection for the trigger finger she defers.  Therefore she will apply Voltaren gel which she has at home to this area 4 times daily.  In regards to the nodule over the thumb at the IP joint recommend that we just watch this for the time being as it is not particularly painful.  Should follow-up with [place] on as-needed basis pain history comes worse.  Questions encouraged and answered.

## 2019-04-13 ENCOUNTER — Ambulatory Visit: Payer: Self-pay | Attending: Internal Medicine | Admitting: Internal Medicine

## 2019-04-13 ENCOUNTER — Encounter: Payer: Self-pay | Admitting: Internal Medicine

## 2019-04-13 ENCOUNTER — Other Ambulatory Visit: Payer: Self-pay

## 2019-04-13 DIAGNOSIS — I10 Essential (primary) hypertension: Secondary | ICD-10-CM

## 2019-04-13 DIAGNOSIS — F329 Major depressive disorder, single episode, unspecified: Secondary | ICD-10-CM

## 2019-04-13 DIAGNOSIS — Z9989 Dependence on other enabling machines and devices: Secondary | ICD-10-CM

## 2019-04-13 DIAGNOSIS — F32A Depression, unspecified: Secondary | ICD-10-CM

## 2019-04-13 DIAGNOSIS — G4733 Obstructive sleep apnea (adult) (pediatric): Secondary | ICD-10-CM

## 2019-04-13 DIAGNOSIS — F419 Anxiety disorder, unspecified: Secondary | ICD-10-CM

## 2019-04-13 MED ORDER — LOSARTAN POTASSIUM 25 MG PO TABS: 25.0000 mg | ORAL_TABLET | Freq: Every day | ORAL | 6 refills | Status: DC

## 2019-04-13 MED ORDER — PANTOPRAZOLE SODIUM 40 MG PO TBEC: 40.0000 mg | DELAYED_RELEASE_TABLET | Freq: Every day | ORAL | 2 refills | Status: DC

## 2019-04-13 MED ORDER — AMLODIPINE BESYLATE 10 MG PO TABS: 10.0000 mg | ORAL_TABLET | Freq: Every day | ORAL | 6 refills | Status: DC

## 2019-04-13 NOTE — Progress Notes (Signed)
Virtual Visit via Telephone Note Due to current restrictions/limitations of in-office visits due to the COVID-19 pandemic, this scheduled clinical appointment was converted to a telehealth visit  I connected with Amy Perez on 04/13/19 at 4:21 p.m by telephone and verified that I am speaking with the correct person using two identifiers. I am in my office.  The patient is at home.  Only the patient and myself participated in this encounter.  I discussed the limitations, risks, security and privacy concerns of performing an evaluation and management service by telephone and the availability of in person appointments. I also discussed with the patient that there may be a patient responsible charge related to this service. The patient expressed understanding and agreed to proceed.   History of Present Illness: Pt with hx of HTN, GERD, anxiety/depression, bone spurs in heels, OA RT knee, morbidity obesity, OSA, CTS RT s/p release  Anx/dep:  Followed by Dr. Lolly MustacheArfeen.  Doing ok on Lexapro but feels she needs something to take when she has an anxiety attack.  She plans to speak with him about it.  She had called his office and he had called her back but she missed his call. Stress about having to move at short notice from her landlord  HTN:  Reports compliance with meds.  No device to check blood pressure.  She is using salt substitutes.  Denies any chest pains or shortness of breath.  No lower extremity edema per se but she has noticed that when she wears short socks she gets a ring around the area where the stocks has stopped.  HL:  Tolerating Lipitor  Obesity: plans to start walking again.  Has a small stepper at home that she uses 2-3 x a wk for 10 mins -reports she has cut back on the amount that she eats.  Drinks mainly water  OSA:  Using CPAP.  Needs new mask    Outpatient Encounter Medications as of 04/13/2019  Medication Sig  . amLODipine (NORVASC) 10 MG tablet TAKE 1 TABLET  (10 MG TOTAL) BY MOUTH DAILY.  Marland Kitchen. atorvastatin (LIPITOR) 10 MG tablet TAKE 1 TABLET BY MOUTH DAILY.  . cetirizine (ZYRTEC) 10 MG tablet Take 1 tablet (10 mg total) by mouth daily.  . ciclopirox (PENLAC) 8 % solution Apply topically at bedtime. Apply over nail and surrounding skin. Apply daily over previous coat. After seven (7) days, may remove with alcohol and continue cycle.  . diclofenac sodium (VOLTAREN) 1 % GEL Apply 2 g topically 4 (four) times daily.  Marland Kitchen. escitalopram (LEXAPRO) 10 MG tablet Take 1 tablet (10 mg total) by mouth daily.  . fluticasone (FLONASE) 50 MCG/ACT nasal spray Place 2 sprays into both nostrils as needed.  Marland Kitchen. HYDROcodone-acetaminophen (NORCO/VICODIN) 5-325 MG tablet Take 1-2 tablets by mouth every 6 (six) hours as needed for moderate pain. (Patient not taking: Reported on 01/24/2019)  . losartan (COZAAR) 25 MG tablet Take 1 tablet (25 mg total) by mouth daily.  . meloxicam (MOBIC) 15 MG tablet Take 1 tablet (15 mg total) by mouth daily.  . Multiple Vitamin (MULTIVITAMIN) tablet Take 1 tablet by mouth daily. Reported on 04/28/2016  . pantoprazole (PROTONIX) 40 MG tablet TAKE 1 TABLET BY MOUTH DAILY.  Marland Kitchen. polyethylene glycol powder (GLYCOLAX/MIRALAX) powder    No facility-administered encounter medications on file as of 04/13/2019.     Observations/Objective: No direct observation done as this was a telephone encounter  Assessment and Plan: 1. Essential hypertension Continue current medications and low-salt diet -  losartan (COZAAR) 25 MG tablet; Take 1 tablet (25 mg total) by mouth daily.  Dispense: 30 tablet; Refill: 6 - amLODipine (NORVASC) 10 MG tablet; Take 1 tablet (10 mg total) by mouth daily.  Dispense: 30 tablet; Refill: 6  2. Morbid obesity ([place]) Commended her on trying to move more and eat less.  Encouraged her to keep up the good work and try to increase physical activity level from 10 minutes to 20 minutes 3 times a week  3. OSA on CPAP Continue use of CPAP.   Will refer her to sleep center at [facility] to get a new mask  4. Anxiety and depression Followed by psychiatry.  She will speak with her psychiatrist about her anxiety attacks   Follow Up Instructions: Follow-up in 3 months   I discussed the assessment and treatment plan with the patient. The patient was provided an opportunity to ask questions and all were answered. The patient agreed with the plan and demonstrated an understanding of the instructions.   The patient was advised to call back or seek an in-person evaluation if the symptoms worsen or if the condition fails to improve as anticipated.  I provided 10 minutes of non-face-to-face time during this encounter.   Karle Plumber, MD

## 2019-04-13 NOTE — Progress Notes (Signed)
Pt is requesting something for anxiety.

## 2019-04-16 MED FILL — ?AMLODIPINE BESYLATE 10 MG: 10 | 30 days supply | Qty: 30 | Fill #0

## 2019-04-16 MED FILL — LOSARTAN POTASSIUM 25 MG TA: 25 | 30 days supply | Qty: 30 | Fill #0

## 2019-04-19 ENCOUNTER — Telehealth: Payer: Self-pay | Admitting: Pulmonary Disease

## 2019-04-19 ENCOUNTER — Telehealth: Payer: Self-pay | Admitting: Internal Medicine

## 2019-04-19 DIAGNOSIS — Z9989 Dependence on other enabling machines and devices: Secondary | ICD-10-CM

## 2019-04-19 DIAGNOSIS — G4733 Obstructive sleep apnea (adult) (pediatric): Secondary | ICD-10-CM

## 2019-04-19 NOTE — Telephone Encounter (Signed)
New Message   Pt states that she is needing a mask for her cpap please see notes in epic(01/02/2019) nto sure if its the same encounter. Please f/u

## 2019-04-19 NOTE — Telephone Encounter (Signed)
Order placed for pt to have the mask fitting.nothing further needed.

## 2019-04-19 NOTE — Telephone Encounter (Signed)
Call and spoke with patient.  Patient stated she is needing a new cpap mask. Patient stated she received her cpap from donation.  Patient stated she goes to [facility].  Patient has no DME to follow up with for supplies. Patient stated she receives a [facility] 100% discount.    PCC's can you advise on this?

## 2019-04-19 NOTE — Telephone Encounter (Signed)
You can put in an order for her to have a mask fitting at the Sleep [facility].  They will give her a mask.

## 2019-04-23 ENCOUNTER — Telehealth: Payer: Self-pay

## 2019-04-23 NOTE — Telephone Encounter (Signed)
I explained to pt the different DME's available to her.  I am not sure of any DME who honors the [facility] letter.  Let pt know about [employer] who would be a lot cheaper than anything that the DME could offer.  Pt states she doesn't have a job & can't afford to provide herself masks.  Pt states she is going to contact her PCP to see what she can do to help her obtain a CPAP mask.

## 2019-04-23 NOTE — Telephone Encounter (Signed)
Error.  Documented on previous message.

## 2019-04-24 ENCOUNTER — Telehealth: Payer: Self-pay

## 2019-04-24 NOTE — Telephone Encounter (Signed)
Call received from patient stating that she needs a new CPAP mask.  She explained that she was told that she needs a new one every three months.  She said that the pulmonary office could not help and the [facility] sleep center told her that she could not get another mask from them.  Provided her with the phone number for [employer] /CPAP assistance program  # 2317761118. She received her CPAP machine from that program and would be eligible to purchase a mask at reduced cost.  She said that she did not like the mask that she was initially fitted for at her sleep study and received from [place] but she would call them to check to see if she could obtain a different type of mask.  Informed her that the [facility] Discount and [employer] do not cover CPAP masks.

## 2019-04-26 MED FILL — MELOXICAM 15 MG TABLET: 15 | 30 days supply | Qty: 30 | Fill #5

## 2019-04-26 MED FILL — ESCITALOPRAM 10 MG TABLET: 10 | 30 days supply | Qty: 30 | Fill #1

## 2019-04-26 MED FILL — ?ATORVASTATIN 10 MG TABLET: 10 | 30 days supply | Qty: 30 | Fill #1

## 2019-04-26 MED FILL — ?PANTOPRAZOLE SO DR 40MG TA: 40 | 30 days supply | Qty: 30 | Fill #0

## 2019-05-02 ENCOUNTER — Other Ambulatory Visit: Payer: Self-pay

## 2019-05-02 ENCOUNTER — Encounter (HOSPITAL_COMMUNITY): Payer: Self-pay | Admitting: Psychiatry

## 2019-05-02 ENCOUNTER — Ambulatory Visit (INDEPENDENT_AMBULATORY_CARE_PROVIDER_SITE_OTHER): Payer: No Typology Code available for payment source | Admitting: Psychiatry

## 2019-05-02 DIAGNOSIS — F331 Major depressive disorder, recurrent, moderate: Secondary | ICD-10-CM

## 2019-05-02 DIAGNOSIS — F411 Generalized anxiety disorder: Secondary | ICD-10-CM

## 2019-05-02 MED ORDER — HYDROXYZINE PAMOATE 25 MG PO CAPS: ORAL_CAPSULE | ORAL | 1 refills | Status: DC

## 2019-05-02 MED ORDER — ESCITALOPRAM OXALATE 10 MG PO TABS: 15.0000 mg | ORAL_TABLET | Freq: Every day | ORAL | 1 refills | Status: DC

## 2019-05-02 MED FILL — hydrOXYzine PAMOATE 25 MG C: 25 | 30 days supply | Qty: 60 | Fill #0

## 2019-05-02 NOTE — Progress Notes (Signed)
Virtual Visit via Telephone Note  I connected with Amy Perez on 05/02/19 at  3:00 PM EDT by telephone and verified that I am speaking with the correct person using two identifiers.   I discussed the limitations, risks, security and privacy concerns of performing an evaluation and management service by telephone and the availability of in person appointments. I also discussed with the patient that there may be a patient responsible charge related to this service. The patient expressed understanding and agreed to proceed.   History of Present Illness: Patient was evaluated through phone session.  She is having a lot of increased anxiety and nervousness.  She moved into the hotel because she was renting the place and owner sold the property.  She is having poor sleep, irritability, crying spells.  She feels sometimes hopeless and describes her mood sad dysphoric and anxious.  She is a support from her best friend.  She admitted not using CPAP because issues with the mask.  She used to take trazodone and initially she stopped using it because she was using CPAP but now she feels that it was causing increased heart rate.  Patient is taking Lexapro 10 mg and in the beginning it was helping her but now since he moved and trying to find a new place her anxiety got worse.  She reported no tremors, shakes or any EPS.  She denies any paranoia or any hallucination.  She like to have something to help her sleep and anxiety.  She denies drinking or using any illegal substances.  Her appetite is okay.  She has not checked her weight but believe her weight is the same.      Past Psychiatric History:Reviewed. H/Odepression and anxiety. Tried Cymbalta up to 60 mg but stopped due to weight gain. Took Zoloft but stopped after taking 2 doses feeling tired and dizzy. Noh/oinpatient treatment or suicidal attempt. Tried trazodone but stopped due to increase Heart Rate.    Psychiatric Specialty  Exam: Physical Exam  ROS  Last menstrual period 12/14/2017.There is no height or weight on file to calculate BMI.  General Appearance: NA  Eye Contact:  NA  Speech:  Normal Rate  Volume:  Normal  Mood:  Anxious, Depressed and Dysphoric  Affect:  NA  Thought Process:  Goal Directed  Orientation:  Full (Time, Place, and Person)  Thought Content:  Rumination  Suicidal Thoughts:  No  Homicidal Thoughts:  No  Memory:  Immediate;   Fair Recent;   Fair Remote;   Fair  Judgement:  Fair  Insight:  Fair  Psychomotor Activity:  NA  Concentration:  Concentration: Fair and Attention Span: Fair  Recall:  [employer]  Fund of Knowledge:  Fair  Language:  Good  Akathisia:  No  Handed:  Right  AIMS (if indicated):     Assets:  Communication Skills Desire for Improvement Resilience  ADL's:  Intact  Cognition:  WNL  Sleep:         Assessment and Plan: Major depressive disorder, recurrent.  Generalized anxiety disorder.  Patient is experiencing increased anxiety and having crying spells due to recent relocation and actively looking for a new place to live.  She is seeing her therapist [name] on a regular basis.  I recommend to contact her physician about CPAP machine and mask to resolve the issue.  Recommend to try Lexapro 15 mg daily and I will add low-dose hydroxyzine 25 mg 1 to 2 capsule as needed to help insomnia, anxiety and  panic attacks.  Discussed medication side effects and benefits.  Recommended to call us back if she has any question or any concern.  Follow-up in 2 months.  Follow Up Instructions:    I discussed the assessment and treatment plan with the patient. The patient was provided an opportunity to ask questions and all were answered. The patient agreed with the plan and demonstrated an understanding of the instructions.   The patient was advised to call back or seek an in-person evaluation if the symptoms worsen or if the condition fails to improve as anticipated.  I  provided 20 minutes of non-face-to-face time during this encounter.   Kathlee Nations, MD

## 2019-05-08 MED FILL — hydrOXYzine PAMOATE 25 MG C: 25 | 30 days supply | Qty: 60 | Fill #1

## 2019-05-21 MED FILL — ?ATORVASTATIN 10 MG TABLET: 10 | 30 days supply | Qty: 30 | Fill #2

## 2019-05-21 MED FILL — MELOXICAM 15 MG TABLET: 15 | 30 days supply | Qty: 30 | Fill #6

## 2019-05-21 MED FILL — ESCITALOPRAM 10 MG TABLET: 10 | 30 days supply | Qty: 45 | Fill #0

## 2019-05-21 MED FILL — LOSARTAN POTASSIUM 25 MG TA: 25 | 30 days supply | Qty: 30 | Fill #1

## 2019-05-21 MED FILL — ?AMLODIPINE BESYLATE 10 MG: 10 | 30 days supply | Qty: 30 | Fill #1

## 2019-05-21 MED FILL — ?PANTOPRAZOLE SO DR 40MG TA: 40 | 30 days supply | Qty: 30 | Fill #1

## 2019-05-29 ENCOUNTER — Other Ambulatory Visit: Payer: Self-pay

## 2019-05-29 ENCOUNTER — Ambulatory Visit (INDEPENDENT_AMBULATORY_CARE_PROVIDER_SITE_OTHER): Payer: No Typology Code available for payment source | Admitting: Psychiatry

## 2019-05-29 ENCOUNTER — Encounter (HOSPITAL_COMMUNITY): Payer: Self-pay | Admitting: Psychiatry

## 2019-05-29 DIAGNOSIS — F331 Major depressive disorder, recurrent, moderate: Secondary | ICD-10-CM

## 2019-05-29 DIAGNOSIS — F411 Generalized anxiety disorder: Secondary | ICD-10-CM

## 2019-05-29 MED ORDER — ESCITALOPRAM OXALATE 10 MG PO TABS: 10.0000 mg | ORAL_TABLET | Freq: Every day | ORAL | 1 refills | Status: DC

## 2019-05-29 MED ORDER — LAMOTRIGINE 25 MG PO TABS: ORAL_TABLET | ORAL | 1 refills | Status: DC

## 2019-05-29 MED FILL — lamoTRIgine 25 MG TABS: 25 | 30 days supply | Qty: 60 | Fill #0

## 2019-05-29 NOTE — Progress Notes (Signed)
Virtual Visit via Telephone Note  I connected with Amy Perez on 05/29/19 at 11:40 AM EDT by telephone and verified that I am speaking with the correct person using two identifiers.   I discussed the limitations, risks, security and privacy concerns of performing an evaluation and management service by telephone and the availability of in person appointments. I also discussed with the patient that there may be a patient responsible charge related to this service. The patient expressed understanding and agreed to proceed.   History of Present Illness: Patient was evaluated through phone session.  She is having a hard time controlling her irritability and anger.  She gets easily agitated.  She is not happy with her current living situation and hoping to have her own place very soon.  Since we increase the Lexapro she noticed more irritability and agitated.  She like to go back on 10 mg.  She has not taken hydroxyzine which was given to help her anxiety.  She feels her sleep is okay.  She is using some nights CPAP.  She also interested something to help her mood and irritability.  She has multiple health issues.  She does not want take any medicine that cause weight gain.  So far she has no tremors, shakes or any EPS.  She does not take trazodone as most of the night she is able to sleep with the CPAP.  She is living in a motel and waiting anxiously to find her own place.  Patient denies drinking or using any illegal substances.   Past Psychiatric History:Reviewed. H/Odepression and anxiety. Tried Cymbalta up to 60 mg but stopped due to weight gain. Took Zoloft but stopped after taking 2 doses feeling tired and dizzy. Noh/oinpatient treatment or suicidal attempt. Tried trazodone but stopped due to increase Heart Rate.   Psychiatric Specialty Exam: Physical Exam  ROS  Last menstrual period 12/14/2017.There is no height or weight on file to calculate BMI.  General Appearance: NA   Eye Contact:  NA  Speech:  Normal Rate  Volume:  Increased  Mood:  Anxious and Irritable  Affect:  NA  Thought Process:  Descriptions of Associations: Intact  Orientation:  Full (Time, Place, and Person)  Thought Content:  Rumination  Suicidal Thoughts:  No  Homicidal Thoughts:  No  Memory:  Immediate;   Fair Recent;   Fair Remote;   Fair  Judgement:  Fair  Insight:  Present  Psychomotor Activity:  NA  Concentration:  Concentration: Fair and Attention Span: Fair  Recall:  [employer]  Fund of Knowledge:  Fair  Language:  Good  Akathisia:  No  Handed:  Right  AIMS (if indicated):     Assets:  Communication Skills Desire for Improvement Resilience  ADL's:  Intact  Cognition:  WNL  Sleep:   fari      Assessment and Plan: Major depressive disorder, recurrent.  Generalized anxiety disorder.  Discussed medication side effects.  Recommended to reduce Lexapro to 10 mg only however she was having anxiety and irritability over 10 mg and we had increased to 50 mg but now she feel her irritability got worse with 50 mg.  I recommend to try Lamictal 25 mg daily for 1 week and then 50 mg daily to help her mood lability.  Continue Lexapro 10 mg for now however in the future we will reduce the dose of Lexapro to manage on one medication.  Encouraged to take hydroxyzine 25 mg as needed to help her irritability and  severe mood swings as needed.  Discussed medication side effects and benefits specially if Lamictal can cause rash then she need to stop the medication and call us immediately.  Follow-up in 2 months.  Follow Up Instructions:    I discussed the assessment and treatment plan with the patient. The patient was provided an opportunity to ask questions and all were answered. The patient agreed with the plan and demonstrated an understanding of the instructions.   The patient was advised to call back or seek an in-person evaluation if the symptoms worsen or if the condition fails to improve  as anticipated.  I provided 20 minutes of non-face-to-face time during this encounter.   Kathlee Nations, MD

## 2019-06-08 ENCOUNTER — Ambulatory Visit: Payer: Self-pay

## 2019-06-11 MED FILL — ?CETIRIZINE HCL 10 MG TABLE: 10 | 90 days supply | Qty: 90 | Fill #2

## 2019-06-18 MED FILL — ?AMLODIPINE BESYLATE 10 MG: 10 | 30 days supply | Qty: 30 | Fill #2

## 2019-06-21 ENCOUNTER — Telehealth: Payer: Self-pay | Admitting: Internal Medicine

## 2019-06-21 NOTE — Telephone Encounter (Signed)
I  Called the Pt she did not answer, unable to LVM since she does not have one.Marland KitchenMarland KitchenMarland Kitchen

## 2019-06-21 NOTE — Telephone Encounter (Signed)
New Message  Pt came in to bring documents for appt on Monday but pt has packet and would like to explain her situation about her documents. Please f/u

## 2019-06-22 ENCOUNTER — Other Ambulatory Visit: Payer: Self-pay | Admitting: Internal Medicine

## 2019-06-22 DIAGNOSIS — M1711 Unilateral primary osteoarthritis, right knee: Secondary | ICD-10-CM

## 2019-06-22 MED FILL — ?ATORVASTATIN 10 MG TABLET: 10 | 30 days supply | Qty: 30 | Fill #0

## 2019-06-22 MED FILL — LOSARTAN POTASSIUM 25 MG TA: 25 | 30 days supply | Qty: 30 | Fill #2

## 2019-06-22 MED FILL — MELOXICAM 15 MG TABLET: 15 | 30 days supply | Qty: 30 | Fill #0

## 2019-06-22 MED FILL — ?PANTOPRAZOLE SODI DR 40MGT: 40 | 30 days supply | Qty: 30 | Fill #2

## 2019-06-25 ENCOUNTER — Ambulatory Visit: Payer: Self-pay | Attending: Internal Medicine

## 2019-06-25 ENCOUNTER — Other Ambulatory Visit: Payer: Self-pay

## 2019-06-25 ENCOUNTER — Ambulatory Visit (INDEPENDENT_AMBULATORY_CARE_PROVIDER_SITE_OTHER): Payer: Self-pay | Admitting: Pulmonary Disease

## 2019-06-25 ENCOUNTER — Encounter: Payer: Self-pay | Admitting: Pulmonary Disease

## 2019-06-25 ENCOUNTER — Telehealth: Payer: Self-pay | Admitting: Internal Medicine

## 2019-06-25 VITALS — BP 120/68 | HR 85 | Temp 98.0°F | Ht 66.0 in | Wt 293.4 lb

## 2019-06-25 DIAGNOSIS — Z9989 Dependence on other enabling machines and devices: Secondary | ICD-10-CM

## 2019-06-25 DIAGNOSIS — G4733 Obstructive sleep apnea (adult) (pediatric): Secondary | ICD-10-CM

## 2019-06-25 NOTE — Telephone Encounter (Signed)
Pt was called since she has a TELE visit for financial to try to apply for [place]FA and the [place].Marland KitchenMarland KitchenP was informed with just the application and not document s attach you ca not qualified for the CAFA and OC program, Pt need top bring proof of address, financial support, FS letter, Non filling 2019, bank statements, Pt was informed that she had until 07/09/19 to bring these document or she need to reschedule a new financial appt an submit a new application with all the documents she need to qualified

## 2019-06-25 NOTE — Patient Instructions (Signed)
Obstructive sleep apnea adequately treated with CPAP therapy  No changes need to be made at present Continue to use CPAP  I will see you back in about 6 months  Call with any significant concerns

## 2019-06-25 NOTE — Progress Notes (Signed)
Amy Perez [facility]    [id number]    June 09, 1966  Primary Care Physician:Johnson, Dalbert Batman, MD  Referring Physician: Ladell Pier, MD [address] [place],  [place] [address]  Chief complaint:   Patient with a history of obstructive sleep apnea Has been doing well since her last visit Has no significant concerns with CPAP use  HPI:  Continues to use CPAP on a regular basis Still has some symptoms of sleepiness during the day  Has had other social issues of accommodation   Currently on CPAP of 15 Tolerating it well Improved symptoms  Diagnoses obstructive sleep apnea and titrated to a pressure of 18-pressure was reduced to 15 during last visit and is better tolerated She also did have to change our mask  Stable bedtime, sometimes to go to bed at 3 PM Wakes up a few times during the night Wakes up different times in the mornings  Has been gaining weight History of PTSD, history of depression History of morbid obesity Hypertension   Outpatient Encounter Medications as of 06/25/2019  Medication Sig  . amLODipine (NORVASC) 10 MG tablet Take 1 tablet (10 mg total) by mouth daily.  Marland Kitchen atorvastatin (LIPITOR) 10 MG tablet TAKE 1 TABLET BY MOUTH DAILY.  . cetirizine (ZYRTEC) 10 MG tablet Take 1 tablet (10 mg total) by mouth daily.  . ciclopirox (PENLAC) 8 % solution Apply topically at bedtime. Apply over nail and surrounding skin. Apply daily over previous coat. After seven (7) days, may remove with alcohol and continue cycle.  . diclofenac sodium (VOLTAREN) 1 % GEL Apply 2 g topically 4 (four) times daily.  Marland Kitchen escitalopram (LEXAPRO) 10 MG tablet Take 1 tablet (10 mg total) by mouth daily.  . fluticasone (FLONASE) 50 MCG/ACT nasal spray Place 2 sprays into both nostrils as needed.  . hydrOXYzine (VISTARIL) 25 MG capsule Take 1-2 capsule as needed for anxiety  . lamoTRIgine (LAMICTAL) 25 MG tablet Take one tab daily for one week and than two tab daily  .  losartan (COZAAR) 25 MG tablet Take 1 tablet (25 mg total) by mouth daily.  . meloxicam (MOBIC) 15 MG tablet TAKE 1 TABLET (15 MG TOTAL) BY MOUTH DAILY.  . Multiple Vitamin (MULTIVITAMIN) tablet Take 1 tablet by mouth daily. Reported on 04/28/2016  . pantoprazole (PROTONIX) 40 MG tablet Take 1 tablet (40 mg total) by mouth daily.  . polyethylene glycol powder (GLYCOLAX/MIRALAX) powder    No facility-administered encounter medications on file as of 06/25/2019.     Allergies as of 06/25/2019 - Review Complete 06/25/2019  Allergen Reaction Noted  . Benadryl [diphenhydramine hcl] Itching 11/06/2012  . Milk-related compounds Diarrhea 09/22/2016  . Oxycodone-acetaminophen Nausea And Vomiting 02/27/2007  . Penicillins Nausea Only 02/27/2007    Past Medical History:  Diagnosis Date  . AC (acromioclavicular) joint bone spurs   . Acid reflux   . Anxiety   . Arthritis    knees  . CTS (carpal tunnel syndrome)    Bilateral  . Depression   . Gastric ulcer   . GERD (gastroesophageal reflux disease)   . Headache   . Heel spur   . History of chicken pox   . Hypertension   . Morbid obesity ([place])   . Panic attacks   . Plantar fasciitis   . Sickle cell trait ([place])   . Sleep apnea    uses sometimes  . Tendonitis    Feet    Past Surgical History:  Procedure Laterality Date  . CARPAL TUNNEL RELEASE Right 10/26/2018   Procedure: RIGHT CARPAL TUNNEL RELEASE;  Surgeon: Kathryne HitchBlackman, Christopher Y, MD;  Location: [facility];  Service: Orthopedics;  Laterality: Right;  . CHOLECYSTECTOMY    . COLONOSCOPY WITH PROPOFOL N/A 01/09/2018   Procedure: COLONOSCOPY WITH PROPOFOL;  Surgeon: Corbin Adeourk, Robert M, MD;  [facility];  Service: Endoscopy;  Laterality: N/A;  8:45am  . TUBAL LIGATION  08/29/04  . WISDOM TOOTH EXTRACTION      Family History  Problem Relation Age of Onset  . Hypertension Father [id number]       Deceased  . Heart attack Father   . Hypertension Mother        Living  .  Arthritis Mother   . Diabetes Mother   . Dementia Mother   . Depression Mother   . Arthritis Sister        x2  . Lung cancer Sister        Deceased  . Cancer Other        Paternal & Maternal Aunts  . Heart attack Maternal Grandmother   . Neuropathy Sister   . Bipolar disorder Sister   . Schizophrenia Sister   . Heart disease Brother        x1  . Alcohol abuse Brother   . Alcoholism Brother        x2  . Colon cancer Neg Hx     Social History   Socioeconomic History  . Marital status: Single    Spouse name: Not on file  . Number of children: 0  . Years of education: Not on file  . Highest education level: Some college, no degree  Occupational History    Comment: not employed   Social Needs  . Financial resource strain: Hard  . Food insecurity    Worry: Often true    Inability: Often true  . Transportation needs    Medical: Yes    Non-medical: Yes  Tobacco Use  . Smoking status: Never Smoker  . Smokeless tobacco: Never Used  Substance and Sexual Activity  . Alcohol use: No  . Drug use: No  . Sexual activity: Not Currently    Birth control/protection: Post-menopausal    Comment: BTL  Lifestyle  . Physical activity    Days per week: 0 days    Minutes per session: 0 min  . Stress: Not on file  Relationships  . Social Musicianconnections    Talks on phone: Not on file    Gets together: Not on file    Attends religious service: More than 4 times per year    Active member of club or organization: No    Attends meetings of clubs or organizations: Never    Relationship status: Never married  . Intimate partner violence    Fear of current or ex partner: No    Emotionally abused: No    Physically abused: No    Forced sexual activity: No  Other Topics Concern  . Not on file  Social History Narrative  . Not on file    Review of Systems  Constitutional: Negative.   HENT: Negative.   Eyes: Negative.   Respiratory: Positive for apnea.   Cardiovascular: Negative.    Gastrointestinal: Negative.   Endocrine: Negative.   Genitourinary: Negative.  Negative for vaginal bleeding.  Psychiatric/Behavioral: Positive for sleep disturbance.  All other systems reviewed and are negative.   Vitals:   06/25/19 1641  BP: 120/68  Pulse: 85  Temp: 98 F (36.7 C)  SpO2: 97%     Physical Exam  Constitutional: She appears well-developed and well-nourished.  Obese  HENT:  Head: Normocephalic and atraumatic.  Mallampati 3  Eyes: Pupils are equal, round, and reactive to light. Conjunctivae and EOM are normal.  Neck: Normal range of motion. Neck supple. No tracheal deviation present. No thyromegaly present.  Cardiovascular: Normal rate, regular rhythm and normal heart sounds.  Pulmonary/Chest: Effort normal and breath sounds normal. No respiratory distress. She has no wheezes.  Abdominal: Soft. Bowel sounds are normal. She exhibits no distension. There is no abdominal tenderness.   Sleep study titration did reveal pressure requirement of 18  Results of the Epworth flowsheet 12/29/2018 09/28/2018  Sitting and reading 0 3  Watching TV 3 3  Sitting, inactive in a public place (e.g. a theatre or a meeting) 0 3  As a passenger in a car for an hour without a break 0 0  Lying down to rest in the afternoon when circumstances permit 0 3  Sitting and talking to someone 0 0  Sitting quietly after a lunch without alcohol 0 0  In a car, while stopped for a few minutes in traffic 0 0  Total score 3 12    Assessment:   Obstructive sleep apnea -CPAP is better tolerated -She is functioning well  Obesity -Continues to work on losing weight   Plan/Recommendations:  Continue CPAP therapy at 15  Follow-up CPAP download  Continue weight loss efforts  I will see you back in the office in 6 months    Virl Diamond MD [facility] 06/25/2019, 5:00 PM  CC: Marcine Matar, MD

## 2019-07-04 MED FILL — lamoTRIgine 25 MG TABS: 25 | 30 days supply | Qty: 60 | Fill #1

## 2019-07-04 MED FILL — ESCITALOPRAM 10 MG TABLET: 10 | 30 days supply | Qty: 45 | Fill #1

## 2019-07-10 ENCOUNTER — Telehealth: Payer: Self-pay | Admitting: Internal Medicine

## 2019-07-10 NOTE — Telephone Encounter (Signed)
Pt was sent a letter from financial dept. Inform them, that the application they submitted was incomplete, since they were missing some documentation at the time of the appointment, Pt need to reschedule and resubmit all new papers and application for CAFA and OC P.S. old documents has been sent back to Pt and need to make a new appt Paper are at the [place] office

## 2019-07-12 ENCOUNTER — Telehealth: Payer: Self-pay | Admitting: Internal Medicine

## 2019-07-12 NOTE — Telephone Encounter (Signed)
I call Pt to inform her that we still missing the FS  Letter and since her last date to bring her papers was 07/10/19, she is getting an extension until 07/17/19 can not pass this date or she need to reschedule a new appt and bring all new documents

## 2019-07-19 ENCOUNTER — Ambulatory Visit: Payer: Self-pay | Attending: Internal Medicine | Admitting: Internal Medicine

## 2019-07-19 ENCOUNTER — Encounter: Payer: Self-pay | Admitting: Internal Medicine

## 2019-07-19 DIAGNOSIS — M65331 Trigger finger, right middle finger: Secondary | ICD-10-CM

## 2019-07-19 DIAGNOSIS — G4733 Obstructive sleep apnea (adult) (pediatric): Secondary | ICD-10-CM

## 2019-07-19 DIAGNOSIS — Z2821 Immunization not carried out because of patient refusal: Secondary | ICD-10-CM

## 2019-07-19 DIAGNOSIS — I10 Essential (primary) hypertension: Secondary | ICD-10-CM

## 2019-07-19 DIAGNOSIS — Z9989 Dependence on other enabling machines and devices: Secondary | ICD-10-CM

## 2019-07-19 MED ORDER — CYCLOBENZAPRINE HCL 5 MG PO TABS: 5.0000 mg | ORAL_TABLET | Freq: Every day | ORAL | 1 refills | Status: AC | PRN

## 2019-07-19 MED FILL — CYCLOBENZAPRINE 5 MG TABLET: 5 | 30 days supply | Qty: 30 | Fill #0

## 2019-07-19 NOTE — Progress Notes (Signed)
Virtual Visit via Telephone Note  I connected with Amy Perez on 07/19/19 at 8:45 a.m by telephone and verified that I am speaking with the correct person using two identifiers.   I discussed the limitations, risks, security and privacy concerns of performing an evaluation and management service by telephone and the availability of in person appointments. I also discussed with the patient that there may be a patient responsible charge related to this service. The patient expressed understanding and agreed to proceed.   History of Present Illness: Pt with hx of HTN, GERD, anxiety/depression, bone spurs in heels, OA RT knee, morbidity obesity, OSA, CTS RT s/p release   3rd finger on RT hand locks up on her and hurts "really bad." Has to use other hand to unlock it.  Duration: 1 mth Occurs: daily  HYPERTENSION Currently taking: see medication list Med Adherence: [x]  Yes    []  No Medication side effects: []  Yes    [x]  No Adherence with salt restriction: [x]  Yes    []  No Home Monitoring?: [x]  Yes    []  No Monitoring Frequency:  2 x a wk Home BP results range: 130/80 last checked SOB? []  Yes    [x]  No Chest Pain?: []  Yes    [x]  No Leg swelling?: []  Yes    [x]  No Headaches?: []  Yes    [x]  No Dizziness? []  Yes    [x]  No Comments:   OSA:  Using the CPAP consistently  Obesity: has not started walking as yet but plans to now that weather has cool down.  Reports that meals have been small portion sizes  Observations/Objective:   Chemistry      Component Value Date/Time   NA 141 10/24/2018 1242   NA 142 04/24/2018 1647   K 3.8 10/24/2018 1242   CL 106 10/24/2018 1242   CO2 27 10/24/2018 1242   BUN 8 10/24/2018 1242   BUN 11 04/24/2018 1647   CREATININE 0.94 10/24/2018 1242      Component Value Date/Time   CALCIUM 9.4 10/24/2018 1242   ALKPHOS 78 09/23/2017 1625   AST 21 09/23/2017 1625   ALT 17 09/23/2017 1625   BILITOT 0.2 09/23/2017 1625     Lab Results   Component Value Date   WBC 5.5 01/02/2018   HGB 12.1 01/02/2018   HCT 37.2 01/02/2018   MCV 77.7 (L) 01/02/2018   PLT 349 01/02/2018     Assessment and Plan: 1. Essential hypertension Reported home blood pressure readings are at goal.  Continue Cozaar and amlodipine. - CBC; Future - Lipid panel; Future - Comprehensive metabolic panel; Future  2. Trigger middle finger of right hand Patient willing to try low-dose of Flexeril.  If no improvement with this we can refer to orthopedics - cyclobenzaprine (FLEXERIL) 5 MG tablet; Take 1 tablet (5 mg total) by mouth daily as needed for muscle spasms.  Dispense: 30 tablet; Refill: 1  3. Morbid obesity ([place]) Encouraged her to try get in some form of aerobic exercise at least 3 days a week with the goal of building up to 30 minutes each session.  Dietary counseling given  4. OSA on CPAP Continue compliance with CPAP  5. Influenza vaccination declined   Follow Up Instructions: 3 mths   I discussed the assessment and treatment plan with the patient. The patient was provided an opportunity to ask questions and all were answered. The patient agreed with the plan and demonstrated an understanding of the instructions.   The  patient was advised to call back or seek an in-person evaluation if the symptoms worsen or if the condition fails to improve as anticipated.  I provided 8 minutes of non-face-to-face time during this encounter.   Jonah Blue, MD

## 2019-07-19 NOTE — Progress Notes (Signed)
Patient verified DOB Patient has not eaten today. Patient has not taken medication. Patient complains of right hand "locking up" scaled at a 9.

## 2019-07-20 MED FILL — LOSARTAN POTASSIUM 25 MG TA: 25 | 30 days supply | Qty: 30 | Fill #3

## 2019-07-20 MED FILL — ?AMLODIPINE BESYLATE 10 MG: 10 | 30 days supply | Qty: 30 | Fill #3

## 2019-07-23 ENCOUNTER — Ambulatory Visit: Payer: Self-pay | Attending: Internal Medicine

## 2019-07-23 ENCOUNTER — Other Ambulatory Visit: Payer: Self-pay

## 2019-07-23 DIAGNOSIS — I10 Essential (primary) hypertension: Secondary | ICD-10-CM

## 2019-07-24 LAB — COMPREHENSIVE METABOLIC PANEL
ALT: 11 IU/L (ref 0–32)
AST: 11 IU/L (ref 0–40)
Albumin/Globulin Ratio: 1.4 (ref 1.2–2.2)
Albumin: 4.4 g/dL (ref 3.8–4.9)
Alkaline Phosphatase: 122 IU/L — ABNORMAL HIGH (ref 39–117)
BUN/Creatinine Ratio: 9 (ref 9–23)
BUN: 9 mg/dL (ref 6–24)
Bilirubin Total: 0.4 mg/dL (ref 0.0–1.2)
CO2: 22 mmol/L (ref 20–29)
Calcium: 10 mg/dL (ref 8.7–10.2)
Chloride: 106 mmol/L (ref 96–106)
Creatinine, Ser: 1.02 mg/dL — ABNORMAL HIGH (ref 0.57–1.00)
GFR calc Af Amer: 73 mL/min/{1.73_m2} (ref >59)
GFR calc non Af Amer: 63 mL/min/{1.73_m2} (ref >59)
Globulin, Total: 3.2 g/dL (ref 1.5–4.5)
Glucose: 89 mg/dL (ref 65–99)
Potassium: 3.8 mmol/L (ref 3.5–5.2)
Sodium: 142 mmol/L (ref 134–144)
Total Protein: 7.6 g/dL (ref 6.0–8.5)

## 2019-07-24 LAB — LIPID PANEL
Chol/HDL Ratio: 4.1 ratio (ref 0.0–4.4)
Cholesterol, Total: 207 mg/dL — ABNORMAL HIGH (ref 100–199)
HDL: 51 mg/dL (ref >39)
LDL Chol Calc (NIH): 135 mg/dL — ABNORMAL HIGH (ref 0–99)
Triglycerides: 115 mg/dL (ref 0–149)
VLDL Cholesterol Cal: 21 mg/dL (ref 5–40)

## 2019-07-24 LAB — CBC
Hematocrit: 39.2 % (ref 34.0–46.6)
Hemoglobin: 12.8 g/dL (ref 11.1–15.9)
MCH: 25.2 pg — ABNORMAL LOW (ref 26.6–33.0)
MCHC: 32.7 g/dL (ref 31.5–35.7)
MCV: 77 fL — ABNORMAL LOW (ref 79–97)
Platelets: 382 10*3/uL (ref 150–450)
RBC: 5.07 x10E6/uL (ref 3.77–5.28)
RDW: 15.6 % — ABNORMAL HIGH (ref 11.7–15.4)
WBC: 6.1 10*3/uL (ref 3.4–10.8)

## 2019-07-25 ENCOUNTER — Other Ambulatory Visit: Payer: Self-pay | Admitting: Internal Medicine

## 2019-07-25 MED ORDER — ATORVASTATIN CALCIUM 20 MG PO TABS: 20.0000 mg | ORAL_TABLET | Freq: Every day | ORAL | 4 refills | Status: DC

## 2019-07-25 MED FILL — ?ATORVASTATIN 20 MG TABLET: 20 | 30 days supply | Qty: 30 | Fill #0

## 2019-07-26 ENCOUNTER — Telehealth: Payer: Self-pay | Admitting: Internal Medicine

## 2019-07-26 NOTE — Telephone Encounter (Signed)
Patient called to check on the status of her OC please follow up.

## 2019-07-26 NOTE — Telephone Encounter (Signed)
Patient called for their lab results please follow up

## 2019-07-26 NOTE — Telephone Encounter (Signed)
Pt was inform that the approval was yesterday an she can come to the office to pick a copy of the letter

## 2019-07-27 ENCOUNTER — Other Ambulatory Visit: Payer: Self-pay | Admitting: Internal Medicine

## 2019-07-27 MED FILL — PANTOPRAZOLE SOD DR 40 MG T: 40 | 30 days supply | Qty: 30 | Fill #0

## 2019-07-27 MED FILL — MELOXICAM 15 MG TABLET: 15 | 30 days supply | Qty: 30 | Fill #1

## 2019-07-27 NOTE — Progress Notes (Signed)
Patient received results in person. 

## 2019-07-30 ENCOUNTER — Encounter: Payer: Self-pay | Admitting: Physician Assistant

## 2019-07-30 ENCOUNTER — Ambulatory Visit (INDEPENDENT_AMBULATORY_CARE_PROVIDER_SITE_OTHER): Payer: Self-pay

## 2019-07-30 ENCOUNTER — Ambulatory Visit (INDEPENDENT_AMBULATORY_CARE_PROVIDER_SITE_OTHER): Payer: Self-pay | Admitting: Physician Assistant

## 2019-07-30 DIAGNOSIS — M79641 Pain in right hand: Secondary | ICD-10-CM

## 2019-07-30 DIAGNOSIS — M65331 Trigger finger, right middle finger: Secondary | ICD-10-CM

## 2019-07-30 NOTE — Progress Notes (Signed)
Office Visit Note   Patient: Amy Perez [facility]           Date of Birth: 1966/09/09           MRN: [id number] Visit Date: 07/30/2019              Requested by: Marcine Matar, MD [address] [place],  [place] [address] PCP: Marcine Matar, MD   Assessment & Plan: Visit Diagnoses:  1. Trigger finger, right middle finger   2. Pain in right hand     Plan: We tried to inject the trigger finger today however patient was unable to tolerate even the cold spray and once the injection was attempted needle just piercing the skin closed stretching pain she was unable to proceed with the injection.  Therefore she is given a splint that she to the dorsal aspect of the middle finger and she can take the index and middle finger together at night.  Order to take the splint off during the day.  She will continue to try Voltaren gel for hands.  She will follow-up with [place] on as-needed basis.  Questions encouraged and answered at length.  Follow-Up Instructions: No follow-ups on file.   Orders:  Orders Placed This Encounter  Procedures  . XR Hand Complete Right   No orders of the defined types were placed in this encounter.     Procedures: No procedures performed   Clinical Data: No additional findings.   Subjective: Chief Complaint  Patient presents with  . Right Hand - Pain    HPI Mrs. Castles comes in today with a right middle finger that is locking up been ongoing for the past month.  No injury.  She is also having pain in both hands.  She notices knots on her fingers of both hands.  She is tried over the counter medications including diclofenac gel without any real relief to either hand.  She is tried gloves to help with the pain in both the hands. Review of Systems No fever chills shortness of breath.  Objective: Vital Signs: LMP 12/14/2017 Comment: BTL  Physical Exam Constitutional:      Appearance: She is not ill-appearing or diaphoretic.   Cardiovascular:     Pulses: Normal pulses.  Pulmonary:     Effort: Pulmonary effort is normal.  Neurological:     Mental Status: She is alert and oriented to person, place, and time.  Psychiatric:        Mood and Affect: Mood normal.     Ortho Exam Right hand positive triggering of the middle finger.  Palpable nodule at the A1 pulley. Bilateral hands she has Bouchard's nodules about the PIP joints of most the fingers of both hands.  She has discomfort with compression over these areas.  Hands full sensation.  No rashes skin lesions ulcerations erythema. Specialty Comments:  No specialty comments available.  Imaging: Xr Hand Complete Right  Result Date: 07/30/2019 Right hand 3 views.  No acute findings.  Soft tissue swelling about the PIP joints with decreased joint spaces involving most of the PIP joints.    PMFS History: Patient Active Problem List   Diagnosis Date Noted  . Moderate episode of recurrent major depressive disorder (HCC) 11/30/2018  . Carpal tunnel syndrome, right upper limb 10/26/2018  . OSA (obstructive sleep apnea) 07/27/2018  . Hypomagnesemia 03/27/2018  . Constipation 12/08/2017  . Taking multiple medications for chronic disease 12/08/2017  . Primary osteoarthritis of right knee 04/21/2017  .  Acute bacterial sinusitis 11/07/2016  . Insomnia 07/02/2016  . Carpal tunnel syndrome 06/29/2016  . Gastroesophageal reflux disease without esophagitis 04/26/2016  . Essential hypertension 04/26/2016  . Generalized anxiety disorder 04/26/2016  . OBESITY, MORBID 03/01/2007   Past Medical History:  Diagnosis Date  . AC (acromioclavicular) joint bone spurs   . Acid reflux   . Anxiety   . Arthritis    knees  . CTS (carpal tunnel syndrome)    Bilateral  . Depression   . Gastric ulcer   . GERD (gastroesophageal reflux disease)   . Headache   . Heel spur   . History of chicken pox   . Hypertension   . Morbid obesity ([place])   . Panic attacks   . Plantar  fasciitis   . Sickle cell trait ([place])   . Sleep apnea    uses sometimes  . Tendonitis    Feet    Family History  Problem Relation Age of Onset  . Hypertension Father 20       Deceased  . Heart attack Father   . Hypertension Mother        Living  . Arthritis Mother   . Diabetes Mother   . Dementia Mother   . Depression Mother   . Arthritis Sister        x2  . Lung cancer Sister        Deceased  . Cancer Other        Paternal & Maternal Aunts  . Heart attack Maternal Grandmother   . Neuropathy Sister   . Bipolar disorder Sister   . Schizophrenia Sister   . Heart disease Brother        x1  . Alcohol abuse Brother   . Alcoholism Brother        x2  . Colon cancer Neg Hx     Past Surgical History:  Procedure Laterality Date  . CARPAL TUNNEL RELEASE Right 10/26/2018   Procedure: RIGHT CARPAL TUNNEL RELEASE;  Surgeon: Mcarthur Rossetti, MD;  Location: [place];  Service: Orthopedics;  Laterality: Right;  . CHOLECYSTECTOMY    . COLONOSCOPY WITH PROPOFOL N/A 01/09/2018   Procedure: COLONOSCOPY WITH PROPOFOL;  Surgeon: Daneil Dolin, MD;  [facility];  Service: Endoscopy;  Laterality: N/A;  8:45am  . TUBAL LIGATION  08/29/04  . WISDOM TOOTH EXTRACTION     Social History   Occupational History    Comment: not employed   Tobacco Use  . Smoking status: Never Smoker  . Smokeless tobacco: Never Used  Substance and Sexual Activity  . Alcohol use: No  . Drug use: No  . Sexual activity: Not Currently    Birth control/protection: Post-menopausal    Comment: BTL

## 2019-07-31 ENCOUNTER — Telehealth: Payer: Self-pay | Admitting: Physician Assistant

## 2019-07-31 ENCOUNTER — Other Ambulatory Visit: Payer: Self-pay

## 2019-07-31 ENCOUNTER — Encounter (HOSPITAL_COMMUNITY): Payer: Self-pay | Admitting: Psychiatry

## 2019-07-31 ENCOUNTER — Ambulatory Visit (INDEPENDENT_AMBULATORY_CARE_PROVIDER_SITE_OTHER): Payer: Self-pay | Admitting: Psychiatry

## 2019-07-31 DIAGNOSIS — F331 Major depressive disorder, recurrent, moderate: Secondary | ICD-10-CM

## 2019-07-31 DIAGNOSIS — F411 Generalized anxiety disorder: Secondary | ICD-10-CM

## 2019-07-31 MED ORDER — ESCITALOPRAM OXALATE 10 MG PO TABS: 10.0000 mg | ORAL_TABLET | Freq: Every day | ORAL | 1 refills | Status: DC

## 2019-07-31 MED ORDER — LAMOTRIGINE 25 MG PO TABS: 75.0000 mg | ORAL_TABLET | Freq: Every day | ORAL | 1 refills | Status: DC

## 2019-07-31 NOTE — Telephone Encounter (Signed)
Pt called in wanting to let gil know the name of the spray its called active ice that the pt uses authorizes joint pain. Pt would like for gil to give her a call when he can she has some questions for him.    [phone]

## 2019-07-31 NOTE — Telephone Encounter (Signed)
See below

## 2019-07-31 NOTE — Progress Notes (Signed)
Virtual Visit via Telephone Note  I connected with Amy Perez on 07/31/19 at 11:40 AM EDT by telephone and verified that I am speaking with the correct person using two identifiers.   I discussed the limitations, risks, security and privacy concerns of performing an evaluation and management service by telephone and the availability of in person appointments. I also discussed with the patient that there may be a patient responsible charge related to this service. The patient expressed understanding and agreed to proceed.   History of Present Illness: Patient was evaluated through phone session.  We started her on Lamictal on the last visit because of irritability anger and mood swings.  She is now taking Lamictal 50 mg.  She admitted her mood is not as bad however she still gets sometimes irritable.  She admitted not using CPAP machine every night and some nights she has difficulty sleeping.  She does not want to sleep early and sometimes she stays until 2:00 in the morning.  She reported no tremors, shakes or any EPS.  She has no rash or any itching.  She is taking Lexapro 10 mg which we decreased from 50 mg as patient was having mood swings.  She is seeing therapist Amy Perez but admitted sometimes she skipped the appointment.  She denies drinking or using any illegal substances.  She lives with her fianc.  Patient has multiple health issues.  She admitted not compliant with walking exercise and diet.  However she promised that she will resume exercise.  Patient told she has chronic pain and sometimes she has no motivation to do things.  Thyroid level is fair.  Recently she had a blood work.  Her cholesterol is 207 and creatinine 1.02.  Her glucose and LFTs were normal.   Past Psychiatric History:Reviewed. H/Odepression and anxiety. Tried Cymbalta up to 60 mg (weight gain), Zoloft (dizzy after two doses) and Trazadone (increased Heart Rate). No h/o inpatient or suicidal attempt.     Recent Results (from the past 2160 hour(s))  Comprehensive metabolic panel     Status: Abnormal   Collection Time: 07/23/19  3:20 PM  Result Value Ref Range   Glucose 89 65 - 99 mg/dL   BUN 9 6 - 24 mg/dL   Creatinine, Ser 9.601.02 (H) 0.57 - 1.00 mg/dL   GFR calc non Af Amer 63 >59 mL/min/1.73   GFR calc Af Amer 73 >59 mL/min/1.73   BUN/Creatinine Ratio 9 9 - 23   Sodium 142 134 - 144 mmol/L   Potassium 3.8 3.5 - 5.2 mmol/L   Chloride 106 96 - 106 mmol/L   CO2 22 20 - 29 mmol/L   Calcium 10.0 8.7 - 10.2 mg/dL   Total Protein 7.6 6.0 - 8.5 g/dL   Albumin 4.4 3.8 - 4.9 g/dL   Globulin, Total 3.2 1.5 - 4.5 g/dL   Albumin/Globulin Ratio 1.4 1.2 - 2.2   Bilirubin Total 0.4 0.0 - 1.2 mg/dL   Alkaline Phosphatase 122 (H) 39 - 117 IU/L   AST 11 0 - 40 IU/L   ALT 11 0 - 32 IU/L  Lipid panel     Status: Abnormal   Collection Time: 07/23/19  3:20 PM  Result Value Ref Range   Cholesterol, Total 207 (H) 100 - 199 mg/dL   Triglycerides 454115 0 - 149 mg/dL   HDL 51 >09>39 mg/dL   VLDL Cholesterol Cal 21 5 - 40 mg/dL   LDL Chol Calc (NIH) 811135 (H) 0 - 99 mg/dL  Chol/HDL Ratio 4.1 0.0 - 4.4 ratio    Comment:                                   T. Chol/HDL Ratio                                             Men  Women                               1/2 Avg.Risk  3.4    3.3                                   Avg.Risk  5.0    4.4                                2X Avg.Risk  9.6    7.1                                3X Avg.Risk 23.4   11.0   CBC     Status: Abnormal   Collection Time: 07/23/19  3:20 PM  Result Value Ref Range   WBC 6.1 3.4 - 10.8 x10E3/uL   RBC 5.07 3.77 - 5.28 x10E6/uL   Hemoglobin 12.8 11.1 - 15.9 g/dL   Hematocrit 19.3 79.0 - 46.6 %   MCV 77 (L) 79 - 97 fL   MCH 25.2 (L) 26.6 - 33.0 pg   MCHC 32.7 31.5 - 35.7 g/dL   RDW 24.0 (H) 97.3 - 53.2 %   Platelets 382 150 - 450 x10E3/uL      Psychiatric Specialty Exam: Physical Exam  ROS  Last menstrual period 12/14/2017.There  is no height or weight on file to calculate BMI.  General Appearance: NA  Eye Contact:  NA  Speech:  Normal Rate  Volume:  Normal  Mood:  Anxious and Irritable  Affect:  NA  Thought Process:  Descriptions of Associations: Intact  Orientation:  Full (Time, Place, and Person)  Thought Content:  Rumination  Suicidal Thoughts:  No  Homicidal Thoughts:  No  Memory:  Immediate;   Fair Recent;   Fair Remote;   Fair  Judgement:  Fair  Insight:  Fair  Psychomotor Activity:  NA  Concentration:  Concentration: Fair and Attention Span: Fair  Recall:  [employer] of Knowledge:  Fair  Language:  Good  Akathisia:  No  Handed:  Right  AIMS (if indicated):     Assets:  Communication Skills Desire for Improvement Housing Resilience Social Support  ADL's:  Intact  Cognition:  Impaired,  Mild  Sleep:   fair      Assessment and Plan: Major depressive disorder, recurrent.  Generalized anxiety disorder.  I reviewed her blood work results.  I encouraged that she should take the medication on time and use CPAP machine every night to help with insomnia.  Discussed healthy lifestyle and sleep hygiene.  Recommended to try Lamictal 75 mg since 50 mg help her a lot but is still have mood lability.  Continue  Lexapro 10 mg daily.  Patient is no longer taking hydroxyzine.  Encourage to keep appointment with her therapist Amy Perez.  Recommended to call us back if she has any question of any concern.  Discussed medication side effect specially increase Lamictal can cause rash and in that case she need to stop the medication and call us immediately.  Follow-up in 2 months.  Follow Up Instructions:    I discussed the assessment and treatment plan with the patient. The patient was provided an opportunity to ask questions and all were answered. The patient agreed with the plan and demonstrated an understanding of the instructions.   The patient was advised to call back or seek an in-person evaluation if  the symptoms worsen or if the condition fails to improve as anticipated.  I provided 20 minutes of non-face-to-face time during this encounter.   Kathlee Nations, MD

## 2019-08-01 NOTE — Telephone Encounter (Signed)
called

## 2019-08-06 NOTE — Telephone Encounter (Signed)
Patient aware of results, notified in person 08/14/2019 by Franki Cabot, [place].

## 2019-08-21 ENCOUNTER — Ambulatory Visit (INDEPENDENT_AMBULATORY_CARE_PROVIDER_SITE_OTHER): Payer: Self-pay | Admitting: Nurse Practitioner

## 2019-08-21 ENCOUNTER — Other Ambulatory Visit: Payer: Self-pay

## 2019-08-21 ENCOUNTER — Encounter: Payer: Self-pay | Admitting: Nurse Practitioner

## 2019-08-21 VITALS — BP 127/81 | HR 88 | Temp 97.2°F | Ht 66.0 in | Wt 294.6 lb

## 2019-08-21 DIAGNOSIS — K5900 Constipation, unspecified: Secondary | ICD-10-CM

## 2019-08-21 DIAGNOSIS — K219 Gastro-esophageal reflux disease without esophagitis: Secondary | ICD-10-CM

## 2019-08-21 DIAGNOSIS — R109 Unspecified abdominal pain: Secondary | ICD-10-CM

## 2019-08-21 MED ORDER — TRULANCE 3 MG PO TABS: 3.0000 mg | ORAL_TABLET | Freq: Every day | ORAL | 2 refills | Status: DC

## 2019-08-21 NOTE — Assessment & Plan Note (Signed)
GERD currently doing well on daily Protonix.  Recommend she continue her medication and call us for any worsening or severe symptoms.  Follow-up in 2 months.

## 2019-08-21 NOTE — Assessment & Plan Note (Signed)
Constipation that has tried and failed Linzess, Amitiza.  Did not try Trulance as previously recommended.  I will send in a 1 month supply to see if this helps as we do not have samples currently in our office.  If this is not effective or is not covered by insurance we may have to try other options such Motegrity.  We are running out of options for her constipation.  I recommended if Trulance helps but not well enough then she can add MiraLAX once a day to see if this improves.  Follow-up in 2 months.

## 2019-08-21 NOTE — Assessment & Plan Note (Addendum)
The patient has abdominal pain when she tries to have a bowel movement with straining.  She has chronic constipation.  Her abdominal pain generally does not improve much after a bowel movement.  However, she does have incomplete emptying.  I feel likely her abdominal pain will improve a miracle to get her constipation better managed.  She is tried and failed Linzess, Amitiza 8 mcg.  It sounds like she did not pick up and trialed Trulance.  We do not currently have any samples and I will send a 30-day supply to her pharmacy to try.  She is to call us in 1 to 2 weeks and let us know if it helps and if so we can send in a longer prescription.  We may have to adjust based on insurance coverage.  Return for follow-up in 2 months.

## 2019-08-21 NOTE — Progress Notes (Signed)
Referring Provider: Ladell Pier, MD Primary Care Physician:  Ladell Pier, MD Primary GI:  Dr. Gala Romney  Chief Complaint  Patient presents with   Constipation    up to 4 days w/o BM, straining when having BM, no RB. Takes whatever she can find to help with BM   Abdominal Pain    lower abd when trying to have BM    HPI:   Amy Perez is a 53 y.o. female who presents for abdominal pain and change in bowels.  The patient was last seen in our office 03/27/2018 for constipation, GERD, hypomagnesemia.  Previously with lower abdominal pain that resolves with bowel movement, 2-3 bowel movements a week and MiraLAX previously ineffective.  She did have success with Linzess 72 mcg daily.  Colonoscopy up-to-date 2019 that was essentially normal and recommended 10-year repeat in 2029.  Noted abnormal electrolytes including hypokalemia and hypomagnesemia for which she was given supplementation.  She was on hydrochlorothiazide at that time.  She felt Linzess was causing her electrolyte abnormalities.  Per her request she was switched to Amitiza 8 mcg twice daily with samples left for trial.  She called our office asking to stop magnesium and noted constipation but not taking anything because she feels the medicines affect her potassium level.  Recommended she continue magnesium and further discuss her concerns at her next office visit.  She also asked for a "scan her test for my stomach."  At her last visit she was not taking anything for constipation.  Tried and failed Amitiza and Linzess.  Bowel movement twice a week, fiber supplement has helped somewhat.  Abdominal pain improves after a bowel movement.  Breakthrough symptoms about 3 times a month of her GERD for which she is taking Nexium.  No other GI complaints.  Recommended update labs, Zantac 150 milligrams for breakthrough GERD, samples of Trulance 3 mg.  Follow-up in 3 months.  She called our office indicating Trulance is  not working and she was given samples of Amitiza 24 mcg twice daily.  She called back saying this was not working either.  There were multiple phone calls between our office and the patient related to constipation and magnesium level.  Last magnesium was completely normal and recommended she follow-up with her primary care for further testing and supplementation needs.    She was scheduled for a follow-up office visit 10/11/2018 but she was a no-show.  She was again scheduled for a virtual office visit 02/20/2019 but she was a no-show.  Today she states she is having abdominal pain when trying to have a bowel movement as well as constipation. It doesn't appear she's on any medication for constipation. Has tried/failed Linzess, Amitiza 8 mcg, Amitiza 24 mcg, Trulance. She states she didn't try (what she thinks) is Trulance. Currently taking a mix of prunes, stool softener, MiraLAX. She has a lot of gas with this. Doesn't really take MiraLAX much and only eats prunes about every 3 months. Essentially she's not on any regular constipation medication. She has abdominal pain when she tries to have bowel movement. Has a bowel movement as little as every 4-5 days; incomplete emptying with associated persistent abdominal pain after a bowel movement. Denies hematochezia, melena. Has a lot of gas. Denies N/V, melena, fever, chills, unintentional weight loss. Denies URI or flu-like symptoms. Denies loss of sense of taste or smell. Denies chest pain, dyspnea, dizziness, lightheadedness, syncope, near syncope. Denies any other upper or lower GI symptoms.  GERD doing well on PPI.  Past Medical History:  Diagnosis Date   AC (acromioclavicular) joint bone spurs    Acid reflux    Anxiety    Arthritis    knees   CTS (carpal tunnel syndrome)    Bilateral   Depression    Gastric ulcer    GERD (gastroesophageal reflux disease)    Headache    Heel spur    History of chicken pox    Hypertension     Morbid obesity (HCC)    Panic attacks    Plantar fasciitis    Sickle cell trait (HCC)    Sleep apnea    uses sometimes   Tendonitis    Feet    Past Surgical History:  Procedure Laterality Date   CARPAL TUNNEL RELEASE Right 10/26/2018   Procedure: RIGHT CARPAL TUNNEL RELEASE;  Surgeon: Kathryne HitchBlackman, Christopher Y, MD;  Location: [facility];  Service: Orthopedics;  Laterality: Right;   CHOLECYSTECTOMY     COLONOSCOPY WITH PROPOFOL N/A 01/09/2018   Procedure: COLONOSCOPY WITH PROPOFOL;  Surgeon: Corbin Adeourk, Robert M, MD;  Location: [facility];  Service: Endoscopy;  Laterality: N/A;  8:45am   TUBAL LIGATION  08/29/04   WISDOM TOOTH EXTRACTION      Current Outpatient Medications  Medication Sig Dispense Refill   amLODipine (NORVASC) 10 MG tablet Take 1 tablet (10 mg total) by mouth daily. 30 tablet 6   atorvastatin (LIPITOR) 20 MG tablet Take 1 tablet (20 mg total) by mouth daily. 30 tablet 4   cyclobenzaprine (FLEXERIL) 5 MG tablet Take 1 tablet (5 mg total) by mouth daily as needed for muscle spasms. 30 tablet 1   diclofenac sodium (VOLTAREN) 1 % GEL Apply 2 g topically 4 (four) times daily. 100 g 0   escitalopram (LEXAPRO) 10 MG tablet Take 1 tablet (10 mg total) by mouth daily. 30 tablet 1   fluticasone (FLONASE) 50 MCG/ACT nasal spray Place 2 sprays into both nostrils as needed.     hydrOXYzine (VISTARIL) 25 MG capsule Take 1-2 capsule as needed for anxiety 60 capsule 1   lamoTRIgine (LAMICTAL) 25 MG tablet Take 3 tablets (75 mg total) by mouth daily. 90 tablet 1   losartan (COZAAR) 25 MG tablet Take 1 tablet (25 mg total) by mouth daily. 30 tablet 6   meloxicam (MOBIC) 15 MG tablet TAKE 1 TABLET (15 MG TOTAL) BY MOUTH DAILY. 30 tablet 2   Multiple Vitamin (MULTIVITAMIN) tablet Take 1 tablet by mouth daily. Reported on 04/28/2016     pantoprazole (PROTONIX) 40 MG tablet TAKE 1 TABLET (40 MG TOTAL) BY MOUTH DAILY. 30 tablet 2   Plecanatide (TRULANCE) 3 MG  TABS Take 3 mg by mouth daily. 30 tablet 2   No current facility-administered medications for this visit.     Allergies as of 08/21/2019 - Review Complete 08/21/2019  Allergen Reaction Noted   Benadryl [diphenhydramine hcl] Itching 11/06/2012   Milk-related compounds Diarrhea 09/22/2016   Oxycodone-acetaminophen Nausea And Vomiting 02/27/2007   Penicillins Nausea Only 02/27/2007    Family History  Problem Relation Age of Onset   Hypertension Father 6161       Deceased   Heart attack Father    Hypertension Mother        Living   Arthritis Mother    Diabetes Mother    Dementia Mother    Depression Mother    Arthritis Sister        x2   Lung cancer Sister  Deceased   Cancer Other        Paternal & Maternal Aunts   Heart attack Maternal Grandmother    Neuropathy Sister    Bipolar disorder Sister    Schizophrenia Sister    Heart disease Brother        x1   Alcohol abuse Brother    Alcoholism Brother        x2   Colon cancer Neg Hx     Social History   Socioeconomic History   Marital status: Single    Spouse name: Not on file   Number of children: 0   Years of education: Not on file   Highest education level: Some college, no degree  Occupational History    Comment: not employed   Ecologist strain: Geophysicist/field seismologist insecurity    Worry: Often true    Inability: Often true   Transportation needs    Medical: Yes    Non-medical: Yes  Tobacco Use   Smoking status: Never Smoker   Smokeless tobacco: Never Used  Substance and Sexual Activity   Alcohol use: No   Drug use: No   Sexual activity: Not Currently    Birth control/protection: Post-menopausal    Comment: BTL  Lifestyle   Physical activity    Days per week: 0 days    Minutes per session: 0 min   Stress: Not on file  Relationships   Social connections    Talks on phone: Not on file    Gets together: Not on file    Attends religious  service: More than 4 times per year    Active member of club or organization: No    Attends meetings of clubs or organizations: Never    Relationship status: Never married  Other Topics Concern   Not on file  Social History Narrative   Not on file    Review of Systems: General: Negative for anorexia, weight loss, fever, chills, fatigue, weakness. ENT: Negative for hoarseness, difficulty swallowing. CV: Negative for chest pain, angina, palpitations, peripheral edema.  Respiratory: Negative for dyspnea at rest, cough, sputum, wheezing.  GI: See history of present illness. Endo: Negative for unusual weight change.  Heme: Negative for bruising or bleeding. Allergy: Negative for rash or hives.   Physical Exam: BP 127/81    Pulse 88    Temp (!) 97.2 F (36.2 C) (Oral)    Ht 5\' 6"  (1.676 m)    Wt 294 lb 9.6 oz (133.6 kg)    LMP 12/14/2017 Comment: BTL   BMI 47.55 kg/m  General:   Alert and oriented. Pleasant and cooperative. Well-nourished and well-developed.  Eyes:  Without icterus, sclera clear and conjunctiva pink.  Ears:  Normal auditory acuity. Cardiovascular:  S1, S2 present without murmurs appreciated. Extremities without clubbing or edema. Respiratory:  Clear to auscultation bilaterally. No wheezes, rales, or rhonchi. No distress.  Gastrointestinal:  +BS, soft, non-tender and non-distended. No HSM noted. No guarding or rebound. No masses appreciated.  Rectal:  Deferred  Musculoskalatal:  Symmetrical without gross deformities. Neurologic:  Alert and oriented x4;  grossly normal neurologically. Psych:  Alert and cooperative. Normal mood and affect. Heme/Lymph/Immune: No excessive bruising noted.    08/21/2019 3:22 PM   Disclaimer: This note was dictated with voice recognition software. Similar sounding words can inadvertently be transcribed and may not be corrected upon review.

## 2019-08-21 NOTE — Patient Instructions (Addendum)
Your health issues we discussed today were:   Constipation with abdominal pain: 1. I have sent a different medication to your pharmacy called Trulance.  Take Trulance 3 mg once a day with or without fruits 2. Call us in 1 to 2 weeks and let us know if it is helping 3. If it helps somewhat, but not enough, you can add MiraLAX once a day to see if this improves the effect  GERD (reflux/heartburn): 1. Continue taking your daily acid blocker (Protonix 40 mg) 2. Call us if you have any worsening or severe symptoms  Overall I recommend:  1. Continue your other current medications 2. Follow-up in 2 months 3. Call us if you have any questions or concerns.   Because of recent events of COVID-19 ("Coronavirus"), follow CDC recommendations:  1. Wash your hand frequently 2. Avoid touching your face 3. Stay away from people who are sick 4. If you have symptoms such as fever, cough, shortness of breath then call your healthcare provider for further guidance 5. If you are sick, STAY AT HOME unless otherwise directed by your healthcare provider. 6. Follow directions from state and national officials regarding staying safe   At [facility] we value your feedback. You may receive a survey about your visit today. Please share your experience as we strive to create trusting relationships with our patients to provide genuine, compassionate, quality care.  We appreciate your understanding and patience as we review any laboratory studies, imaging, and other diagnostic tests that are ordered as we care for you. Our office policy is 5 business days for review of these results, and any emergent or urgent results are addressed in a timely manner for your best interest. If you do not hear from our office in 1 week, please contact us.   We also encourage the use of MyChart, which contains your medical information for your review as well. If you are not enrolled in this feature, an access code is on  this after visit summary for your convenience. Thank you for allowing [place] to be involved in your care.  It was great to see you today!  I hope you have a great Fall and Happy Halloween!!

## 2019-08-22 ENCOUNTER — Telehealth: Payer: Self-pay | Admitting: Internal Medicine

## 2019-08-22 DIAGNOSIS — K5900 Constipation, unspecified: Secondary | ICD-10-CM

## 2019-08-22 NOTE — Telephone Encounter (Signed)
Spoke with pt. Pt states that the [facility] doesn't have Trulance and pt would like another RX called in. Pt has tried Linzzess and Amitiza previously, she is willing to try Linzess again if needs.

## 2019-08-22 NOTE — Telephone Encounter (Signed)
Pt was seen by EG yesterday. She said that [facility] in [place] does not carry Trulance and EG would need to call something else in. [phone]

## 2019-08-23 ENCOUNTER — Telehealth: Payer: Self-pay | Admitting: Internal Medicine

## 2019-08-23 MED FILL — ?AMLODIPINE BESYLATE 10 MG: 10 | 30 days supply | Qty: 30 | Fill #4

## 2019-08-23 MED FILL — PANTOPRAZOLE SOD DR 40 MG T: 40 | 30 days supply | Qty: 30 | Fill #1

## 2019-08-23 MED FILL — MELOXICAM 15 MG TABLET: 15 | 30 days supply | Qty: 30 | Fill #2

## 2019-08-23 MED FILL — ?ATORVASTATIN 20 MG TABLET: 20 | 30 days supply | Qty: 30 | Fill #1

## 2019-08-23 MED FILL — LOSARTAN POTASSIUM 25 MG TA: 25 | 30 days supply | Qty: 30 | Fill #4

## 2019-08-23 NOTE — Telephone Encounter (Signed)
Lmom, waiting on a return call.  

## 2019-08-23 NOTE — Telephone Encounter (Signed)
Pt has questions about her medication. [phone]

## 2019-08-24 MED ORDER — LINACLOTIDE 72 MCG PO CAPS: 72.0000 ug | ORAL_CAPSULE | Freq: Every day | ORAL | 1 refills | Status: DC

## 2019-08-24 NOTE — Addendum Note (Signed)
Addended by: Gordy Levan, ERIC A on: 08/24/2019 03:49 PM   Modules accepted: Orders

## 2019-08-24 NOTE — Telephone Encounter (Signed)
I will send Linzess 72 mcg. Take this once a day on an empty stomach. Call us in 1 week and let us know how it's going. We can always increase her dose if needed.

## 2019-08-24 NOTE — Telephone Encounter (Signed)
Pt returned call and would like EG to send Linzess into her pharmacy.

## 2019-08-27 MED FILL — !LINZESS 72 MCG CAPSULE: 72 MCG | 30 days supply | Qty: 30 | Fill #0

## 2019-08-27 NOTE — Telephone Encounter (Signed)
Noted, pt is aware 

## 2019-08-27 NOTE — Telephone Encounter (Signed)
Closing note out. Call has been addressed. See other phone note.

## 2019-09-03 MED FILL — lamoTRIgine 25 MG TABS: 25 | 30 days supply | Qty: 90 | Fill #1

## 2019-09-14 MED FILL — ?CETIRIZINE HCL 10 MG TABLE: 10 | 30 days supply | Qty: 30 | Fill #3

## 2019-09-14 MED FILL — ESCITALOPRAM 10 MG TABLET: 10 | 30 days supply | Qty: 30 | Fill #1

## 2019-09-14 MED FILL — PANTOPRAZOLE SOD DR 40 MG T: 40 | 30 days supply | Qty: 30 | Fill #2

## 2019-09-17 ENCOUNTER — Ambulatory Visit (INDEPENDENT_AMBULATORY_CARE_PROVIDER_SITE_OTHER): Payer: Self-pay | Admitting: Otolaryngology

## 2019-09-18 ENCOUNTER — Other Ambulatory Visit: Payer: Self-pay | Admitting: Internal Medicine

## 2019-09-18 DIAGNOSIS — M1711 Unilateral primary osteoarthritis, right knee: Secondary | ICD-10-CM

## 2019-09-18 MED FILL — ?AMLODIPINE BESYLATE 10 MG: 10 | 30 days supply | Qty: 30 | Fill #5

## 2019-09-18 MED FILL — LOSARTAN POTASSIUM 25 MG TA: 25 | 30 days supply | Qty: 30 | Fill #5

## 2019-09-19 MED FILL — MELOXICAM 15 MG TABLET: 15 | 30 days supply | Qty: 30 | Fill #0

## 2019-09-28 ENCOUNTER — Other Ambulatory Visit (HOSPITAL_COMMUNITY): Payer: Self-pay | Admitting: Psychiatry

## 2019-09-28 ENCOUNTER — Telehealth: Payer: Self-pay | Admitting: Pulmonary Disease

## 2019-09-28 DIAGNOSIS — F331 Major depressive disorder, recurrent, moderate: Secondary | ICD-10-CM

## 2019-09-28 DIAGNOSIS — F411 Generalized anxiety disorder: Secondary | ICD-10-CM

## 2019-09-28 MED FILL — ?ATORVASTATIN 20 MG TABLET: 20 | 30 days supply | Qty: 30 | Fill #2

## 2019-09-28 NOTE — Telephone Encounter (Signed)
Spoke with pt, she states her new machine doesn't have a SD card in her machine. I advised her to get one from the [facility] but she states she doesn't have a company because she receives her medical care through [facility]. She stated she needed a new mask but I am not sure how to go about this. PCC's do you know how to get a new mask for pt? She cant afford to purchase one on her own. Please advise. Marland Kitchen

## 2019-10-01 ENCOUNTER — Ambulatory Visit (INDEPENDENT_AMBULATORY_CARE_PROVIDER_SITE_OTHER): Payer: Self-pay | Admitting: Psychiatry

## 2019-10-01 ENCOUNTER — Other Ambulatory Visit: Payer: Self-pay

## 2019-10-01 ENCOUNTER — Encounter (HOSPITAL_COMMUNITY): Payer: Self-pay | Admitting: Psychiatry

## 2019-10-01 DIAGNOSIS — F411 Generalized anxiety disorder: Secondary | ICD-10-CM

## 2019-10-01 DIAGNOSIS — F331 Major depressive disorder, recurrent, moderate: Secondary | ICD-10-CM

## 2019-10-01 MED ORDER — ESCITALOPRAM OXALATE 10 MG PO TABS: 10.0000 mg | ORAL_TABLET | Freq: Every day | ORAL | 1 refills | Status: DC

## 2019-10-01 MED ORDER — LAMOTRIGINE 100 MG PO TABS: 100.0000 mg | ORAL_TABLET | Freq: Every day | ORAL | 1 refills | Status: DC

## 2019-10-01 MED FILL — lamoTRIgine 100 MG TABS: 100 | 30 days supply | Qty: 30 | Fill #0

## 2019-10-01 NOTE — Telephone Encounter (Signed)
She just needs to go for a mask fitting appt [facility].  Order has already been placed back in June by Dr Ander Slade.  I left a vm for pt to make her aware and left her phone # for [facility].  I called [facility] and spoke to Kia.  She is going to reach out to pt to schedule.  Nothing further needed.

## 2019-10-01 NOTE — Progress Notes (Signed)
Virtual Visit via Telephone Note  I connected with Amy Perez on 10/01/19 at 11:20 AM EST by telephone and verified that I am speaking with the correct person using two identifiers.   I discussed the limitations, risks, security and privacy concerns of performing an evaluation and management service by telephone and the availability of in person appointments. I also discussed with the patient that there may be a patient responsible charge related to this service. The patient expressed understanding and agreed to proceed.   History of Present Illness: Patient was evaluated through phone session.  On the last visit we increase Lamictal 75 mg daily.  She admitted much improvement in her mood irritability and anger.  She is also sleeping better.  She admitted not using CPAP machine because she had some difficulty with the mask and she is hoping this issue resolved.  She has no rash, itching, tremors or shakes.  She is taking Lexapro 10 mg which we have been decreased from 15 mg 3 months ago.  She is no longer taking hydroxyzine.  She had a good Thanksgiving.  She is in therapy with [name].  Her energy level remains low.  She does not walk and exercise.  Patient denies drinking or using any illegal substances.  She denies any feeling of hopelessness or worthlessness.  She denies any panic attack.   Past Psychiatric History:Reviewed. H/Odepression and anxiety. Tried Cymbalta up to 60 mg (weight gain), Zoloft (dizzy after two doses) and Trazadone (increased Heart Rate). No h/o inpatient or suicidal attempt.     Psychiatric Specialty Exam: Physical Exam  ROS  Last menstrual period 12/14/2017.There is no height or weight on file to calculate BMI.  General Appearance: NA  Eye Contact:  NA  Speech:  Normal Rate  Volume:  Normal  Mood:  Anxious  Affect:  NA  Thought Process:  Descriptions of Associations: Intact  Orientation:  Full (Time, Place, and Person)  Thought Content:   Rumination  Suicidal Thoughts:  No  Homicidal Thoughts:  No  Memory:  Immediate;   Fair Recent;   Fair Remote;   Fair  Judgement:  Fair  Insight:  Present  Psychomotor Activity:  NA  Concentration:  Concentration: Fair and Attention Span: Fair  Recall:  [employer] of Knowledge:  Good  Language:  Good  Akathisia:  No  Handed:  Right  AIMS (if indicated):     Assets:  Communication Skills Desire for Improvement Housing Resilience  ADL's:  Intact  Cognition:  WNL  Sleep:   ok      Assessment and Plan: Major depressive disorder, recurrent.  Generalized anxiety disorder.  Patient doing better with Lamictal.  Recommended to try Lamictal 100 mg daily since patient has no side effects.  Continue Lexapro 10 mg daily.  Encourage to regular exercise and walk and keep appointment with [name] for therapy.  Recommended to call us back if she has any question or any concern.  Follow-up in 2 months.  Follow Up Instructions:    I discussed the assessment and treatment plan with the patient. The patient was provided an opportunity to ask questions and all were answered. The patient agreed with the plan and demonstrated an understanding of the instructions.   The patient was advised to call back or seek an in-person evaluation if the symptoms worsen or if the condition fails to improve as anticipated.  I provided 20 minutes of non-face-to-face time during this encounter.   Kathlee Nations,  MD   

## 2019-10-02 ENCOUNTER — Telehealth: Payer: Self-pay | Admitting: Pulmonary Disease

## 2019-10-02 DIAGNOSIS — G4733 Obstructive sleep apnea (adult) (pediatric): Secondary | ICD-10-CM

## 2019-10-02 NOTE — Telephone Encounter (Signed)
Yes we can send an order for mask

## 2019-10-02 NOTE — Telephone Encounter (Signed)
Spoke with pt, she needs an order for a CPAP mask to be faxed over to [facility]. Dr. Ander Slade please advise if we can send the order.

## 2019-10-03 NOTE — Telephone Encounter (Signed)
Spoke with pt. She is aware of Dr. Judson Roch response. Order has been placed for CPAP supplies. Nothing further was needed at this time.

## 2019-10-15 ENCOUNTER — Other Ambulatory Visit: Payer: Self-pay | Admitting: Internal Medicine

## 2019-10-15 MED FILL — LOSARTAN POTASSIUM 25 MG TA: 25 | 30 days supply | Qty: 30 | Fill #6

## 2019-10-15 MED FILL — MELOXICAM 15 MG TABLET: 15 | 30 days supply | Qty: 30 | Fill #1

## 2019-10-15 MED FILL — ?AMLODIPINE BESYLATE 10 MG: 10 | 30 days supply | Qty: 30 | Fill #6

## 2019-10-15 MED FILL — ESCITALOPRAM 10 MG TABLET: 10 | 30 days supply | Qty: 30 | Fill #0

## 2019-10-15 MED FILL — ?CETIRIZINE HCL 10 MG TABLE: 10 | 30 days supply | Qty: 30 | Fill #4

## 2019-10-16 MED FILL — PANTOPRAZOLE SOD DR 40 MG T: 40 | 30 days supply | Qty: 30 | Fill #0

## 2019-10-22 ENCOUNTER — Telehealth: Payer: Self-pay | Admitting: Radiology

## 2019-10-22 NOTE — Telephone Encounter (Signed)
Advised handicap form is ready for pickup at front desk.  Dr. Ninfa Linden approved 6 month temporary, can be reevaluated afterwards if needed longer.

## 2019-10-23 ENCOUNTER — Ambulatory Visit: Payer: Self-pay | Admitting: Nurse Practitioner

## 2019-10-23 ENCOUNTER — Telehealth: Payer: Self-pay | Admitting: Physician Assistant

## 2019-10-23 NOTE — Telephone Encounter (Signed)
Pt walked into office requesting something for pain. Pt request a call back to let her know if pain medication will be given.

## 2019-10-23 NOTE — Telephone Encounter (Signed)
We Amy Perez) saw her in early October for trigger finger with follow-up as needed.  Not sure what she needs pain meds for other than NSAIDs.

## 2019-10-23 NOTE — Telephone Encounter (Signed)
Please advise 

## 2019-10-24 ENCOUNTER — Telehealth: Payer: Self-pay | Admitting: Physician Assistant

## 2019-10-24 MED ORDER — TRAMADOL HCL 50 MG PO TABS: 50.0000 mg | ORAL_TABLET | Freq: Four times a day (QID) | ORAL | 0 refills | Status: DC | PRN

## 2019-10-24 MED FILL — ?ATORVASTATIN 20 MG TABLET: 20 | 30 days supply | Qty: 30 | Fill #3

## 2019-10-24 NOTE — Telephone Encounter (Signed)
Patient called. Says she uses [facility] for her medication RX. She would like the Tramadol called in there.

## 2019-10-24 NOTE — Telephone Encounter (Signed)
I called patient this morning she's still having severe pain in her hands and fingers.  Voltaren gel, meloxicam from her med list and anything over the counter is not helping.  Is there something else she can have for the pain or need to make a follow up visit?

## 2019-10-24 NOTE — Telephone Encounter (Signed)
Patient aware [facility] receive pain medications so we sent it to her [facility]

## 2019-10-24 NOTE — Telephone Encounter (Signed)
I'll send in some Tramadol to [facility]

## 2019-10-24 NOTE — Telephone Encounter (Signed)
LMOM that tramadol has been sent into [facility].

## 2019-11-06 ENCOUNTER — Ambulatory Visit: Payer: Self-pay | Admitting: Nurse Practitioner

## 2019-11-07 MED FILL — lamoTRIgine 100 MG TABS: 100 | 30 days supply | Qty: 30 | Fill #1

## 2019-11-07 MED FILL — ESCITALOPRAM 10 MG TABLET: 10 | 30 days supply | Qty: 30 | Fill #1

## 2019-11-14 MED FILL — LOSARTAN POTASSIUM 25 MG TA: 25 | 30 days supply | Qty: 30 | Fill #5

## 2019-11-14 MED FILL — AMLODIPINE BESYLATE 10 MG T: 10 | 30 days supply | Qty: 30 | Fill #1

## 2019-11-14 MED FILL — ?CETIRIZINE HCL 10 MG TABLE: 10 | 30 days supply | Qty: 30 | Fill #5

## 2019-11-14 MED FILL — MELOXICAM 15 MG TABLET: 15 | 30 days supply | Qty: 30 | Fill #2

## 2019-11-14 MED FILL — !LINZESS 72 MCG CAPSULE: 72 MCG | 30 days supply | Qty: 30 | Fill #1

## 2019-11-22 MED FILL — PANTOPRAZOLE SOD DR 40 MG T: 40 | 30 days supply | Qty: 30 | Fill #1

## 2019-11-22 MED FILL — ?ATORVASTATIN 20 MG TABLET: 20 | 30 days supply | Qty: 30 | Fill #4

## 2019-11-27 ENCOUNTER — Other Ambulatory Visit: Payer: Self-pay

## 2019-11-27 ENCOUNTER — Ambulatory Visit: Payer: Self-pay | Attending: Internal Medicine | Admitting: Internal Medicine

## 2019-11-27 DIAGNOSIS — K5909 Other constipation: Secondary | ICD-10-CM

## 2019-11-27 DIAGNOSIS — G4733 Obstructive sleep apnea (adult) (pediatric): Secondary | ICD-10-CM

## 2019-11-27 DIAGNOSIS — Z9989 Dependence on other enabling machines and devices: Secondary | ICD-10-CM

## 2019-11-27 DIAGNOSIS — E782 Mixed hyperlipidemia: Secondary | ICD-10-CM

## 2019-11-27 DIAGNOSIS — I10 Essential (primary) hypertension: Secondary | ICD-10-CM

## 2019-11-27 NOTE — Progress Notes (Signed)
Virtual Visit via Telephone Note Due to current restrictions/limitations of in-office visits due to the COVID-19 pandemic, this scheduled clinical appointment was converted to a telehealth visit  I connected with Amy Perez on 11/27/19 at 4:40 p.m by telephone and verified that I am speaking with the correct person using two identifiers. I am in my office.  The patient is at home.  Only the patient and myself participated in this encounter.  I discussed the limitations, risks, security and privacy concerns of performing an evaluation and management service by telephone and the availability of in person appointments. I also discussed with the patient that there may be a patient responsible charge related to this service. The patient expressed understanding and agreed to proceed.   History of Present Illness: Pt with hx of HTN, HL, GERD, anxiety/depression, bone spurs in heels,OA RT knee,morbidity obesity, OSA, CTS RT s/p release, chronic constipation.  Last evaluated 06/2019   Constipation:  On Linzess from GI for chronic constipation.    HTN: has a device but cannot locate it to check her blood pressure routinely at home Compliant with meds and salt restriction No CP, SOB, LE edema  HL:  Taking and tolaerating Lipitor Had slight elev in her alk phos on last blood test.   OSA:  Reports compliance with CPAP but needs new mask.  She called the company that used to assist our patients with getting mask but states she was told they no longer do that.  She is working on trying to save funds to purchase a new one.  Obesity:  Not over eating.  Thinks she may have loss a little wgh.  Tries to park further away from buildings.  Too cold outside to go walking  HM:  Due for pap  Outpatient Encounter Medications as of 11/27/2019  Medication Sig  . amLODipine (NORVASC) 10 MG tablet Take 1 tablet (10 mg total) by mouth daily.  Marland Kitchen atorvastatin (LIPITOR) 20 MG tablet Take 1 tablet (20 mg  total) by mouth daily.  . cyclobenzaprine (FLEXERIL) 5 MG tablet Take 1 tablet (5 mg total) by mouth daily as needed for muscle spasms.  Marland Kitchen escitalopram (LEXAPRO) 10 MG tablet Take 1 tablet (10 mg total) by mouth daily.  Marland Kitchen lamoTRIgine (LAMICTAL) 100 MG tablet Take 1 tablet (100 mg total) by mouth daily.  Marland Kitchen linaclotide (LINZESS) 72 MCG capsule Take 1 capsule (72 mcg total) by mouth daily before breakfast.  . losartan (COZAAR) 25 MG tablet Take 1 tablet (25 mg total) by mouth daily.  . meloxicam (MOBIC) 15 MG tablet TAKE 1 TABLET (15 MG TOTAL) BY MOUTH DAILY.  . pantoprazole (PROTONIX) 40 MG tablet TAKE 1 TABLET (40 MG TOTAL) BY MOUTH DAILY.  . traMADol (ULTRAM) 50 MG tablet Take 1-2 tablets (50-100 mg total) by mouth every 6 (six) hours as needed.  . diclofenac sodium (VOLTAREN) 1 % GEL Apply 2 g topically 4 (four) times daily. (Patient not taking: Reported on 11/27/2019)  . fluticasone (FLONASE) 50 MCG/ACT nasal spray Place 2 sprays into both nostrils as needed. (Patient not taking: Reported on 11/27/2019)  . hydrOXYzine (VISTARIL) 25 MG capsule Take 1-2 capsule as needed for anxiety (Patient not taking: Reported on 10/01/2019)  . Multiple Vitamin (MULTIVITAMIN) tablet Take 1 tablet by mouth daily. Reported on 04/28/2016  . Plecanatide (TRULANCE) 3 MG TABS Take 3 mg by mouth daily. (Patient not taking: Reported on 11/27/2019)   No facility-administered encounter medications on file as of 11/27/2019.    Observations/Objective:  Results for orders placed or performed in visit on 07/23/19  Comprehensive metabolic panel  Result Value Ref Range   Glucose 89 65 - 99 mg/dL   BUN 9 6 - 24 mg/dL   Creatinine, Ser 1.02 (H) 0.57 - 1.00 mg/dL   GFR calc non Af Amer 63 >59 mL/min/1.73   GFR calc Af Amer 73 >59 mL/min/1.73   BUN/Creatinine Ratio 9 9 - 23   Sodium 142 134 - 144 mmol/L   Potassium 3.8 3.5 - 5.2 mmol/L   Chloride 106 96 - 106 mmol/L   CO2 22 20 - 29 mmol/L   Calcium 10.0 8.7 - 10.2 mg/dL    Total Protein 7.6 6.0 - 8.5 g/dL   Albumin 4.4 3.8 - 4.9 g/dL   Globulin, Total 3.2 1.5 - 4.5 g/dL   Albumin/Globulin Ratio 1.4 1.2 - 2.2   Bilirubin Total 0.4 0.0 - 1.2 mg/dL   Alkaline Phosphatase 122 (H) 39 - 117 IU/L   AST 11 0 - 40 IU/L   ALT 11 0 - 32 IU/L  Lipid panel  Result Value Ref Range   Cholesterol, Total 207 (H) 100 - 199 mg/dL   Triglycerides 115 0 - 149 mg/dL   HDL 51 >39 mg/dL   VLDL Cholesterol Cal 21 5 - 40 mg/dL   LDL Chol Calc (NIH) 135 (H) 0 - 99 mg/dL   Chol/HDL Ratio 4.1 0.0 - 4.4 ratio  CBC  Result Value Ref Range   WBC 6.1 3.4 - 10.8 x10E3/uL   RBC 5.07 3.77 - 5.28 x10E6/uL   Hemoglobin 12.8 11.1 - 15.9 g/dL   Hematocrit 39.2 34.0 - 46.6 %   MCV 77 (L) 79 - 97 fL   MCH 25.2 (L) 26.6 - 33.0 pg   MCHC 32.7 31.5 - 35.7 g/dL   RDW 15.6 (H) 11.7 - 15.4 %   Platelets 382 150 - 450 x10E3/uL    Assessment and Plan: 1. Essential hypertension -Advised patient to try to locate her home blood pressure monitoring device so that she can check blood pressure at least once a week with goal being 130/80 or lower.  Last 3 blood pressure readings recorded in the EMR are at goal.  She will continue current medications and low-salt diet  2. OSA on CPAP Encouraged her to continue to be compliant with using her CPAP.  She is saving up to get a new mask for her device.  3. Morbid obesity ([place]) Encourage patient to get in some form of moderate intensity exercise at least 3 to 4 days a week.  Discussed other ways she can get an exercise indoors during these winter months.  Encourage healthy eating habits and small portion sizes.  4. Mixed hyperlipidemia Continue Lipitor I offered for her to come back to the lab to have LFTs recheck for the mild elevation in alk phos.  Patient wants to hold off for now  5. Chronic constipation On Linzess through GI.   Follow Up Instructions: 6-7 wks for pap   I discussed the assessment and treatment plan with the patient. The patient  was provided an opportunity to ask questions and all were answered. The patient agreed with the plan and demonstrated an understanding of the instructions.   The patient was advised to call back or seek an in-person evaluation if the symptoms worsen or if the condition fails to improve as anticipated.  I provided 10 minutes of non-face-to-face time during this encounter.   Karle Plumber, MD

## 2019-12-03 ENCOUNTER — Encounter (HOSPITAL_COMMUNITY): Payer: Self-pay | Admitting: Psychiatry

## 2019-12-03 ENCOUNTER — Ambulatory Visit (INDEPENDENT_AMBULATORY_CARE_PROVIDER_SITE_OTHER): Payer: Self-pay | Admitting: Psychiatry

## 2019-12-03 ENCOUNTER — Other Ambulatory Visit: Payer: Self-pay

## 2019-12-03 DIAGNOSIS — F411 Generalized anxiety disorder: Secondary | ICD-10-CM

## 2019-12-03 DIAGNOSIS — F331 Major depressive disorder, recurrent, moderate: Secondary | ICD-10-CM

## 2019-12-03 MED ORDER — ESCITALOPRAM OXALATE 10 MG PO TABS: 10.0000 mg | ORAL_TABLET | Freq: Every day | ORAL | 0 refills | Status: DC

## 2019-12-03 MED ORDER — LAMOTRIGINE 100 MG PO TABS: 100.0000 mg | ORAL_TABLET | Freq: Every day | ORAL | 0 refills | Status: DC

## 2019-12-03 NOTE — Progress Notes (Signed)
Virtual Visit via Telephone Note  I connected with Amy Perez on 12/03/19 at  3:00 PM EST by telephone and verified that I am speaking with the correct person using two identifiers.   I discussed the limitations, risks, security and privacy concerns of performing an evaluation and management service by telephone and the availability of in person appointments. I also discussed with the patient that there may be a patient responsible charge related to this service. The patient expressed understanding and agreed to proceed.   History of Present Illness: Patient was evaluated through phone session.  She is not taking Lamictal 100 mg daily Lexapro 10 mg daily.  She noticed improvement in her depression but she continues to feel anxious and nervous about her physical health.  She has IBS and does not believe the new medicine helping her IBS symptoms.  She has cramping and abdominal pain.  She feel new medicine also causing increased sleep and there are nights when she can sleep for 10 hours and then some nights she does not sleep well.  She is in therapy with Patton Salles.  She does not want to change medication since she feels her anxiety depression is stable on Lamictal and Lexapro.  She has no tremors, shakes or any EPS.  We have tried higher dose of Lexapro but she could not tolerate.  She is no longer taking hydroxyzine.  She has a plan to give call to her doctor who prescribed Linzess to see if she can get a different medication.  She denies any feeling of hopelessness or worthlessness.  Her appetite is okay.  She reported decreased energy and sometimes lack of motivation but denies any suicidal thoughts.  She uses CPAP for sleep.   Past Psychiatric History:Reviewed. H/Odepression and anxiety. Tried Cymbalta up to 60 mg(weight gain),Zoloft(dizzy after two doses), Trazadone (increased Heart Rate) and Hydroxyzine (did not work). No h/o inpatient or suicidal attempt.    Psychiatric  Specialty Exam: Physical Exam  Review of Systems  Last menstrual period 12/14/2017.There is no height or weight on file to calculate BMI.  General Appearance: NA  Eye Contact:  NA  Speech:  Normal Rate  Volume:  Normal  Mood:  Anxious  Affect:  NA  Thought Process:  Descriptions of Associations: Intact  Orientation:  Full (Time, Place, and Person)  Thought Content:  Rumination  Suicidal Thoughts:  No  Homicidal Thoughts:  No  Memory:  Immediate;   Fair Recent;   Fair Remote;   Fair  Judgement:  Fair  Insight:  Present  Psychomotor Activity:  NA  Concentration:  Concentration: Fair and Attention Span: Fair  Recall:  [employer] of Knowledge:  average  Language:  Good  Akathisia:  No  Handed:  Right  AIMS (if indicated):     Assets:  Communication Skills Desire for Improvement Housing Resilience  ADL's:  Intact  Cognition:  WNL  Sleep:   on CPAP      Assessment and Plan: Major depressive disorder, recurrent.  Generalized anxiety disorder.  Patient has no rash or any itching.  She prefer Lamictal since it helped her mood and irritability.  She is not sure if Linzess is helping her IBS and she wanted to make call tomorrow morning to her physician.  Continue Lexapro 10 mg daily.  Encourage to continue therapy with Patton Salles.  Recommended regular exercise.  Follow-up in 3 months.  Recommend to call us back if she is any question or any concern.  Follow Up Instructions:    I discussed the assessment and treatment plan with the patient. The patient was provided an opportunity to ask questions and all were answered. The patient agreed with the plan and demonstrated an understanding of the instructions.   The patient was advised to call back or seek an in-person evaluation if the symptoms worsen or if the condition fails to improve as anticipated.  I provided 20 minutes of non-face-to-face time during this encounter.   Kathlee Nations, MD

## 2019-12-04 ENCOUNTER — Ambulatory Visit (INDEPENDENT_AMBULATORY_CARE_PROVIDER_SITE_OTHER): Payer: Self-pay | Admitting: Nurse Practitioner

## 2019-12-04 ENCOUNTER — Encounter: Payer: Self-pay | Admitting: Nurse Practitioner

## 2019-12-04 ENCOUNTER — Other Ambulatory Visit: Payer: Self-pay

## 2019-12-04 ENCOUNTER — Encounter: Payer: Self-pay | Admitting: Internal Medicine

## 2019-12-04 DIAGNOSIS — K5900 Constipation, unspecified: Secondary | ICD-10-CM

## 2019-12-04 DIAGNOSIS — K219 Gastro-esophageal reflux disease without esophagitis: Secondary | ICD-10-CM

## 2019-12-04 DIAGNOSIS — R109 Unspecified abdominal pain: Secondary | ICD-10-CM

## 2019-12-04 MED ORDER — MOTEGRITY 2 MG PO TABS: 1.0000 | ORAL_TABLET | Freq: Every day | ORAL | 3 refills | Status: AC

## 2019-12-04 MED FILL — lamoTRIgine 100 MG TABS: 100 | 30 days supply | Qty: 30 | Fill #0

## 2019-12-04 MED FILL — ESCITALOPRAM 10 MG TABLET: 10 | 30 days supply | Qty: 30 | Fill #0

## 2019-12-04 NOTE — Addendum Note (Signed)
Addended by: Delane Ginger, Tyauna Lacaze A on: 12/04/2019 09:39 AM   Modules accepted: Level of Service

## 2019-12-04 NOTE — Assessment & Plan Note (Signed)
The patient is on chronic PPI for GERD.  Does not describe any worsening GERD symptoms or flares.  Recommend she continue her current PPI and call us with any problems.  Follow-up in 3 months.

## 2019-12-04 NOTE — Assessment & Plan Note (Signed)
The patient has persistent constipation despite trial of multiple medications.  She previously tried Linzess, Amitiza 8 mcg, Amitiza 24 mcg.  We attempted a trial of [facility].  Also attempted and failed MiraLAX.  We have since retried Linzess which she states initially caused electrolyte abnormalities.  At this point she states she feels like this medication makes her weak.  She is not currently on opioids.  She is on Ultram but denies taking it because it does not help her.  She is asking for pain medication but I explained any pain medications would likely make her constipation worse.  At this point, given unlikely OIC, medications we have up to try include Trulance and Motegrity.  I will attempt [facility] and see if [facility] carry it.  Take 2 mg daily.  Call in 2 to 3 weeks with progress report.  Unfortunately, despite her request, I am unable to know if Trulance or Motegrity may possibly work.  We do not have samples for her to try and she does not want to spend money on medication she does not know to work.  Follow-up in 3 months.

## 2019-12-04 NOTE — Patient Instructions (Addendum)
Your health issues we discussed today were:   Constipation with abdominal pain: 1. I have [facility] a medication [facility] called Motegrity.   2. Take Motegrity 2 mg once a day.  Call us in 2 to 3 weeks and let us know if it is helping 3. Another option is Trulance, if Motegrity does not work 4. As we discussed, we unfortunately do not have samples of many medications given the current coronavirus/COVID-19 pandemic 5. Your abdominal pain is most likely due to your ongoing constipation.  When we are able to get her constipation under better control your abdominal pain should improve. 6. Call us for any worsening symptoms  GERD (reflux/heartburn): 1. Continue taking Protonix daily 2. Let us know if you have any worsening or severe symptoms  Overall I recommend:  1. Continue your other current medications 2. Return for follow-up in 3 months 3. Call us if you have any questions or concerns   ---------------------------------------------------------------  COVID-19 Vaccine Information can be found at: [email or web address] For questions related to vaccine distribution or appointments, please email [email or web address] or call [phone].   ---------------------------------------------------------------   At [facility] we value your feedback. You may receive a survey about your visit today. Please share your experience as we strive to create trusting relationships with our patients to provide genuine, compassionate, quality care.  We appreciate your understanding and patience as we review any laboratory studies, imaging, and other diagnostic tests that are ordered as we care for you. Our office policy is 5 business days for review of these results, and any emergent or urgent results are addressed in a timely manner for your best interest. If you do not hear from our office in 1 week, please  contact us.   We also encourage the use of MyChart, which contains your medical information for your review as well. If you are not enrolled in this feature, an access code is on this after visit summary for your convenience. Thank you for allowing [place] to be involved in your care.  It was great to see you today!  I hope you have a great day!!

## 2019-12-04 NOTE — Progress Notes (Addendum)
Referring Provider: Ladell Pier, MD Primary Care Physician:  Ladell Pier, MD Primary GI:  Dr. Gala Romney  NOTE: Service was provided via telemedicine and was requested by the patient due to COVID-19 pandemic.  Method of visit: Telephone  Patient Location: Home  Provider Location: Office  Reason for Phone Visit: Follow-up  The patient was consented to phone follow-up via telephone encounter including billing of the encounter (yes/no): Yes  Persons present on the phone encounter, with roles: N/A  Total time (minutes) spent on medical discussion: 22 minutes  Chief Complaint  Patient presents with  . Abdominal Pain    constipation,Linzess works for a day or so,pt thinks medicine is making her feel weak    HPI:   Amy Perez is a 54 y.o. female who presents for virtual visit regarding: follow-up on GERD and constipation, and to discuss her medications.  The patient was last seen in our office 08/21/2019 for GERD, constipation, abdominal pain.  Previous history of chronic GERD and constipation with associated abdominal pain.  Colonoscopy up-to-date 2019 with recommended 10-year repeat in 2029.  Previous success with Linzess 72 mcg daily but she felt this is causing electrolyte abnormalities and she was switched to Amitiza 8 mcg twice daily.  She seemed preoccupied with the idea that her constipation medications were causing hypokalemia and she stopped taking them.  She tried and failed Amitiza and Trulance.  Most recent magnesium completely normal and recommended she follow-up with primary care for further testing and supplementation.  At her last visit having persistent abdominal pain when trying to have a bowel movement as well as chronic constipation, not currently on anything for constipation.  Tried and failed Linzess, Amitiza 8 mcg, Amitiza 24 mcg, Trulance.  However, she states she does not think she tried Trulance.  Currently using a mix of prunes, stool  softener, MiraLAX with a lot of gas.  However she states she does not take MiraLAX often and only eats prunes about every 3 months.  Bowel movement is levels every 4 to 5 days with incomplete emptying and persistent abdominal pain after a bowel movement.  No hematochezia, notes a lot of gas.  No other overt GI complaints.  GERD doing well on PPI.  Recommended try Trulance 3 mg, progress report in 1 to 2 weeks, if somewhat effective but not enough can add MiraLAX once a day, continue PPI, follow-up in 2 months.  Communication with the patient indicates [facility] and was okay with trying Linzess 72 mcg again, which was sent to her pharmacy.  No further communication from the patient.  Today she states she's doing ok. Having persistent chronic abdominal pain. States Linzess makes her tired. She is asking about Trulance samples and we do not have any due to COVID-19 pandemic. She is asking for pain medication. States Ultram doesn't help her. This is Rx'd by orthopedics. States that's all they want to give her. Trying to eat light. She takes the Linzess every other day, can't take every day due to low energy. Feels like "something is low in my body to make me feel weak and tired." Not taking Tramadol. Can still try Trulance and Motegrity. Is going to try Peppermint oil (active ingredient in IBGard). Denis N/V, hematochezia, melena, fever, chills, unintentional weight loss. Denies URI or flu-like symptoms. Denies loss of sense of taste or smell. Denies chest pain, dyspnea, dizziness, lightheadedness, syncope, near syncope. Denies any other upper  or lower GI symptoms.  Past Medical History:  Diagnosis Date  . AC (acromioclavicular) joint bone spurs   . Acid reflux   . Anxiety   . Arthritis    knees  . CTS (carpal tunnel syndrome)    Bilateral  . Depression   . Gastric ulcer   . GERD (gastroesophageal reflux disease)   . Headache   . Heel spur     . History of chicken pox   . Hypertension   . Morbid obesity (H[facility])   . Panic attacks   . Plantar fasciitis   . Sickle cell trait (HCC)   . Sleep apnea    uses sometimes  . Tendonitis    Feet    Past Surgical History:  Procedure Laterality Date  . CARPAL TUNNEL RELEASE Right 10/26/2018   Procedure: RIGHT CARPAL TUNNEL RELEASE;  Surgeon: Kathryne Hitch, MD;  Location: [facility];  Service: Orthopedics;  Laterality: Right;  . CHOLECYSTECTOMY    . COLONOSCOPY WITH PROPOFOL N/A 01/09/2018   Procedure: COLONOSCOPY WITH PROPOFOL;  Surgeon: Corbin Ade, MD;  Location: [facility];  Service: Endoscopy;  Laterality: N/A;  8:45am  . TUBAL LIGATION  08/29/04  . WISDOM TOOTH EXTRACTION      Current Outpatient Medications  Medication Sig Dispense Refill  . amLODipine (NORVASC) 10 MG tablet Take 1 tablet (10 mg total) by mouth daily. 30 tablet 6  . atorvastatin (LIPITOR) 20 MG tablet Take 1 tablet (20 mg total) by mouth daily. 30 tablet 4  . cyclobenzaprine (FLEXERIL) 5 MG tablet Take 1 tablet (5 mg total) by mouth daily as needed for muscle spasms. 30 tablet 1  . escitalopram (LEXAPRO) 10 MG tablet Take 1 tablet (10 mg total) by mouth daily. 90 tablet 0  . lamoTRIgine (LAMICTAL) 100 MG tablet Take 1 tablet (100 mg total) by mouth daily. 90 tablet 0  . linaclotide (LINZESS) 72 MCG capsule Take 1 capsule (72 mcg total) by mouth daily before breakfast. (Patient taking differently: Take 72 mcg by mouth every other day. ) 30 capsule 1  . losartan (COZAAR) 25 MG tablet Take 1 tablet (25 mg total) by mouth daily. 30 tablet 6  . meloxicam (MOBIC) 15 MG tablet TAKE 1 TABLET (15 MG TOTAL) BY MOUTH DAILY. 30 tablet 2  . Multiple Vitamin (MULTIVITAMIN) tablet Take 1 tablet by mouth daily. Reported on 04/28/2016    . pantoprazole (PROTONIX) 40 MG tablet TAKE 1 TABLET (40 MG TOTAL) BY MOUTH DAILY. 30 tablet 2  . Plecanatide (TRULANCE) 3 MG TABS Take 3 mg by mouth daily. (Patient not  taking: Reported on 11/27/2019) 30 tablet 2  . traMADol (ULTRAM) 50 MG tablet Take 1-2 tablets (50-100 mg total) by mouth every 6 (six) hours as needed. (Patient not taking: Reported on 12/04/2019) 40 tablet 0   No current facility-administered medications for this visit.    Allergies as of 12/04/2019 - Review Complete 12/04/2019  Allergen Reaction Noted  . Benadryl [diphenhydramine hcl] Itching 11/06/2012  . Milk-related compounds Diarrhea 09/22/2016  . Oxycodone-acetaminophen Nausea And Vomiting 02/27/2007  . Penicillins Nausea Only 02/27/2007    Family History  Problem Relation Age of Onset  . Hypertension Father 107       Deceased  . Heart attack Father   . Hypertension Mother        Living  . Arthritis Mother   . Diabetes Mother   . Dementia Mother   . Depression Mother   . Arthritis Sister  x2  . Lung cancer Sister        Deceased  . Cancer Other        Paternal & Maternal Aunts  . Heart attack Maternal Grandmother   . Neuropathy Sister   . Bipolar disorder Sister   . Schizophrenia Sister   . Heart disease Brother        x1  . Alcohol abuse Brother   . Alcoholism Brother        x2  . Colon cancer Neg Hx     Social History   Socioeconomic History  . Marital status: Single    Spouse name: Not on file  . Number of children: 0  . Years of education: Not on file  . Highest education level: Some college, no degree  Occupational History    Comment: not employed   Tobacco Use  . Smoking status: Never Smoker  . Smokeless tobacco: Never Used  Substance and Sexual Activity  . Alcohol use: No  . Drug use: No  . Sexual activity: Not Currently    Birth control/protection: Post-menopausal    Comment: BTL  Other Topics Concern  . Not on file  Social History Narrative  . Not on file   Social Determinants of Health   Financial Resource Strain:   . Difficulty of Paying Living Expenses: Not on file  Food Insecurity:   . Worried About Programme researcher, broadcasting/film/video in  the Last Year: Not on file  . Ran Out of Food in the Last Year: Not on file  Transportation Needs:   . Lack of Transportation (Medical): Not on file  . Lack of Transportation (Non-Medical): Not on file  Physical Activity:   . Days of Exercise per Week: Not on file  . Minutes of Exercise per Session: Not on file  Stress:   . Feeling of Stress : Not on file  Social Connections:   . Frequency of Communication with Friends and Family: Not on file  . Frequency of Social Gatherings with Friends and Family: Not on file  . Attends Religious Services: Not on file  . Active Member of Clubs or Organizations: Not on file  . Attends Banker Meetings: Not on file  . Marital Status: Not on file    Review of Systems: General: Negative for anorexia, weight loss, fever, chills, fatigue, weakness. ENT: Negative for hoarseness, difficulty swallowing. CV: Negative for chest pain, angina, palpitations, peripheral edema.  Respiratory: Negative for dyspnea at rest, cough, sputum, wheezing.  GI: See history of present illness. Endo: Negative for unusual weight change.  Heme: Negative for bruising or bleeding. Allergy: Negative for rash or hives.  Physical Exam: Note: limited exam due to virtual visit General:   Alert and oriented. Pleasant and cooperative. Ears:  Normal auditory acuity. Neurologic:  Alert and oriented x4;  grossly normal neurologically. Psych:  Alert and cooperative. Normal mood and affect.

## 2019-12-04 NOTE — Progress Notes (Signed)
Cc'ed to pcp °

## 2019-12-04 NOTE — Assessment & Plan Note (Addendum)
Abdominal pain associated with constipation.  We are trying to get her constipation under better control, as per HPI.  This is proven to be a bit difficult.  A couple options left include Trulance and Motegrity.  See further recommendations above.  Return for follow-up in 3 months.  If persistent abdominal pain despite better constipation control (if we are able to get a better control) can consider abdominal CT.  Colonoscopy up-to-date 2019 essentially normal.

## 2019-12-06 ENCOUNTER — Telehealth: Payer: Self-pay

## 2019-12-06 NOTE — Telephone Encounter (Signed)
Vm left from [facility] [phone])  That Motegrity was too expensive.  I tried to call back on the phone number they left and it rang and rang and then cut off.  Minerva Areola, please advise!

## 2019-12-07 ENCOUNTER — Telehealth: Payer: Self-pay | Admitting: Internal Medicine

## 2019-12-07 DIAGNOSIS — K219 Gastro-esophageal reflux disease without esophagitis: Secondary | ICD-10-CM

## 2019-12-07 DIAGNOSIS — K5900 Constipation, unspecified: Secondary | ICD-10-CM

## 2019-12-07 NOTE — Telephone Encounter (Signed)
EG, I spoke with the pharmacy and Motegrity is to expensive since pt doesn't have insurance. They wanted to change to [place]. Pt doesn't want to go back to [place]mit[place] at this time. I asked the Pharmacy if they could get Trulance. They may be able to get Trulance but they may have to submit for pt assistance. Pt is aware and wants Trulance called in.

## 2019-12-07 NOTE — Telephone Encounter (Signed)
[phone] patient called and said that the medication that was called in for her ibs is very expensive and needs to try something else..  Please advise

## 2019-12-10 ENCOUNTER — Telehealth: Payer: Self-pay | Admitting: Physician Assistant

## 2019-12-10 ENCOUNTER — Other Ambulatory Visit: Payer: Self-pay | Admitting: Physician Assistant

## 2019-12-10 MED ORDER — TRAMADOL HCL 50 MG PO TABS: 50.0000 mg | ORAL_TABLET | Freq: Four times a day (QID) | ORAL | 0 refills | Status: DC | PRN

## 2019-12-10 MED FILL — traMADol HCL 50 MG TABS: 50 | 4 days supply | Qty: 30 | Fill #0

## 2019-12-10 NOTE — Telephone Encounter (Signed)
Patient called.   She is still experiencing pain since her last appointment and would like to be prescribed something different for the pain.   Call back number: [phone]

## 2019-12-10 NOTE — Telephone Encounter (Signed)
Needs follow up . Will send in Tramadol one last time.

## 2019-12-10 NOTE — Telephone Encounter (Signed)
See below

## 2019-12-10 NOTE — Telephone Encounter (Signed)
Called patient to advise tramadol was sent to pharmacy. Offered to schedule appointment to follow up since she is still in pain.  Patient refused tramadol states it does not help. Advised we could make appointment to discuss other treatment options. Patient stated she previously had injections which hurt and refused more injections, has previously refused surgery and still refuses surgery. Again advised about scheduling appointment to speak with Dr. Magnus Ivan or Bronson Curb, patient refused appointment and hung up.

## 2019-12-11 MED ORDER — TRULANCE 3 MG PO TABS: 3.0000 mg | ORAL_TABLET | Freq: Every day | ORAL | 2 refills | Status: AC

## 2019-12-11 NOTE — Addendum Note (Signed)
Addended by: Delane Ginger, Martavius Lusty A on: 12/11/2019 05:04 PM   Modules accepted: Orders

## 2019-12-11 NOTE — Telephone Encounter (Signed)
Rx sent 

## 2019-12-12 NOTE — Telephone Encounter (Signed)
Noted. The pharmacy will contact our office if they aren't able to get Trulance for pt.

## 2019-12-14 MED FILL — ?CETIRIZINE HCL 10 MG TABLE: 10 | 30 days supply | Qty: 30 | Fill #6

## 2019-12-14 MED FILL — AMLODIPINE BESYLATE 10 MG T: 10 | 30 days supply | Qty: 30 | Fill #2

## 2019-12-24 ENCOUNTER — Other Ambulatory Visit: Payer: Self-pay | Admitting: Internal Medicine

## 2019-12-24 DIAGNOSIS — I10 Essential (primary) hypertension: Secondary | ICD-10-CM

## 2019-12-24 DIAGNOSIS — M1711 Unilateral primary osteoarthritis, right knee: Secondary | ICD-10-CM

## 2019-12-24 MED FILL — PANTOPRAZOLE SOD DR 40 MG T: 40 | 30 days supply | Qty: 30 | Fill #2

## 2019-12-25 MED FILL — LOSARTAN POTASSIUM 25 MG TA: 25 | 30 days supply | Qty: 30 | Fill #0

## 2019-12-25 MED FILL — ATORVASTATIN CALCIUM 20 MG: 20 | 30 days supply | Qty: 30 | Fill #0

## 2019-12-25 MED FILL — MELOXICAM 15 MG TABLET: 15 | 30 days supply | Qty: 30 | Fill #0

## 2020-01-04 MED FILL — ESCITALOPRAM 10 MG TABLET: 10 | 30 days supply | Qty: 30 | Fill #1

## 2020-01-04 MED FILL — lamoTRIgine 100 MG TABS: 100 | 30 days supply | Qty: 30 | Fill #1

## 2020-01-10 ENCOUNTER — Ambulatory Visit: Payer: Self-pay | Admitting: Internal Medicine

## 2020-01-14 ENCOUNTER — Other Ambulatory Visit: Payer: Self-pay

## 2020-01-14 ENCOUNTER — Ambulatory Visit: Payer: Self-pay

## 2020-01-15 ENCOUNTER — Other Ambulatory Visit: Payer: Self-pay | Admitting: Internal Medicine

## 2020-01-15 DIAGNOSIS — I10 Essential (primary) hypertension: Secondary | ICD-10-CM

## 2020-01-15 MED FILL — ?CETIRIZINE HCL 10 MG TABLE: 10 | 30 days supply | Qty: 30 | Fill #7

## 2020-01-16 MED FILL — AMLODIPINE BESYLATE 10 MG T: 10 | 30 days supply | Qty: 30 | Fill #0

## 2020-01-24 MED FILL — MELOXICAM 15 MG TABLET: 15 | 30 days supply | Qty: 30 | Fill #1

## 2020-01-24 MED FILL — ?LOSARTAN POTASSIUM 25MG: 25 | 30 days supply | Qty: 30 | Fill #1

## 2020-01-28 ENCOUNTER — Other Ambulatory Visit: Payer: Self-pay | Admitting: Internal Medicine

## 2020-01-28 ENCOUNTER — Telehealth: Payer: Self-pay | Admitting: Internal Medicine

## 2020-01-28 MED ORDER — LORATADINE 10 MG PO TABS: 10.0000 mg | ORAL_TABLET | Freq: Every day | ORAL | 1 refills | Status: DC

## 2020-01-28 MED FILL — ?ATORVASTATIN 20 MG TABLET: 20 | 30 days supply | Qty: 30 | Fill #1

## 2020-01-28 MED FILL — ESCITALOPRAM 10 MG TABLET: 10 | 30 days supply | Qty: 30 | Fill #2

## 2020-01-28 NOTE — Telephone Encounter (Signed)
Pt came in asking for meds for allergies please send it to [facility]

## 2020-01-29 MED FILL — LORATADINE 10 MG TABLET: 10 | 30 days supply | Qty: 30 | Fill #0

## 2020-01-29 MED FILL — ?PANTOPRAZOLE SO DR 40MG TA: 40 | 30 days supply | Qty: 30 | Fill #0

## 2020-01-29 NOTE — Telephone Encounter (Signed)
Contacted pt and made aware of rx being sent  

## 2020-02-07 MED FILL — lamoTRIgine 100 MG TABS: 100 | 30 days supply | Qty: 30 | Fill #2

## 2020-02-14 ENCOUNTER — Ambulatory Visit: Payer: Self-pay | Admitting: Internal Medicine

## 2020-02-18 MED FILL — AMLODIPINE BESYLATE 10 MG T: 10 | 30 days supply | Qty: 30 | Fill #1

## 2020-02-18 MED FILL — $TRULANCE 3 MG TABS: 3 | 90 days supply | Qty: 90 | Fill #0

## 2020-02-18 MED FILL — MELOXICAM 15 MG TABLET: 15 | 30 days supply | Qty: 30 | Fill #2

## 2020-02-18 MED FILL — LOSARTAN POTASSIUM 25 MG TA: 25 | 30 days supply | Qty: 30 | Fill #2

## 2020-02-25 ENCOUNTER — Ambulatory Visit: Payer: Self-pay | Admitting: Pulmonary Disease

## 2020-02-25 NOTE — Progress Notes (Signed)
Patient did not show for appointment.   

## 2020-02-26 ENCOUNTER — Other Ambulatory Visit: Payer: Self-pay

## 2020-02-26 ENCOUNTER — Ambulatory Visit (HOSPITAL_BASED_OUTPATIENT_CLINIC_OR_DEPARTMENT_OTHER): Payer: Self-pay | Admitting: Family

## 2020-02-26 ENCOUNTER — Telehealth: Payer: Self-pay

## 2020-02-26 ENCOUNTER — Ambulatory Visit: Payer: Self-pay | Admitting: Internal Medicine

## 2020-02-26 DIAGNOSIS — Z124 Encounter for screening for malignant neoplasm of cervix: Secondary | ICD-10-CM

## 2020-02-26 DIAGNOSIS — Z113 Encounter for screening for infections with a predominantly sexual mode of transmission: Secondary | ICD-10-CM

## 2020-02-26 DIAGNOSIS — Z5329 Procedure and treatment not carried out because of patient's decision for other reasons: Secondary | ICD-10-CM

## 2020-02-26 DIAGNOSIS — Z1231 Encounter for screening mammogram for malignant neoplasm of breast: Secondary | ICD-10-CM

## 2020-02-26 MED FILL — ?PANTOPRAZOLE SO DR 40MG TA: 40 | 30 days supply | Qty: 30 | Fill #1

## 2020-02-26 MED FILL — ?ATORVASTATIN 20 MG TABLET: 20 | 30 days supply | Qty: 30 | Fill #2

## 2020-02-26 NOTE — Telephone Encounter (Signed)
Communication noted - pts plan makes sense

## 2020-02-26 NOTE — Telephone Encounter (Signed)
Called pt to start telephone visit, pt wanted to cancel telephone visit. She started Trulance 02/22/20 and wants to wait until she has time to see if it helps then she will do visit. States otherwise she doesn't have anything she needs to talk about. Advised her to call office when she is ready to schedule visit.  Informed SS to cancel visit for today.  FYI to RMR.

## 2020-02-27 ENCOUNTER — Ambulatory Visit (HOSPITAL_COMMUNITY): Payer: Self-pay | Admitting: Psychiatry

## 2020-02-28 ENCOUNTER — Other Ambulatory Visit: Payer: Self-pay

## 2020-02-28 ENCOUNTER — Ambulatory Visit (INDEPENDENT_AMBULATORY_CARE_PROVIDER_SITE_OTHER): Payer: Self-pay | Admitting: Pulmonary Disease

## 2020-02-28 ENCOUNTER — Encounter: Payer: Self-pay | Admitting: Pulmonary Disease

## 2020-02-28 VITALS — BP 114/70 | HR 78 | Temp 97.0°F | Ht 66.0 in | Wt 298.0 lb

## 2020-02-28 DIAGNOSIS — G4733 Obstructive sleep apnea (adult) (pediatric): Secondary | ICD-10-CM

## 2020-02-28 DIAGNOSIS — Z9989 Dependence on other enabling machines and devices: Secondary | ICD-10-CM

## 2020-02-28 MED ORDER — LEVOCETIRIZINE DIHYDROCHLORIDE 5 MG PO TABS: 5.0000 mg | ORAL_TABLET | Freq: Every evening | ORAL | 5 refills | Status: DC

## 2020-02-28 MED FILL — LEVOCETIRIZINE 5 MG TABLET: 5 | 30 days supply | Qty: 30 | Fill #0

## 2020-02-28 NOTE — Progress Notes (Signed)
[facility]    [id number]    [birth year]  Primary Care Physician:Johnson, Binnie Rail, MD  Referring Physician: Marcine Matar, MD [address] [place],  [place] [address]  Chief complaint:   Patient with a history of obstructive sleep apnea Has been doing well since her last visit   HPI: She has no concerns about sleep apnea treatment Not very compliant with CPAP use lately, she states she falls asleep before putting machine on  Still has some symptoms of sleepiness during the day  Has had other social issues of accommodation  Having some issues with depression lately  Currently on CPAP of 15 Tolerating it well  Diagnoses obstructive sleep apnea and titrated to a pressure of 18-pressure was reduced to 15 during last visit and is better tolerated She also did have to change our mask  Stable bedtime, sometimes to go to bed at 3 PM Wakes up a few times during the night Wakes up different times in the mornings  Has been gaining weight History of PTSD, history of depression History of morbid obesity Hypertension   Outpatient Encounter Medications as of 02/28/2020  Medication Sig  . amLODipine (NORVASC) 10 MG tablet TAKE 1 TABLET (10 MG TOTAL) BY MOUTH DAILY.  Marland Kitchen atorvastatin (LIPITOR) 20 MG tablet TAKE 1 TABLET (20 MG TOTAL) BY MOUTH DAILY.  . cyclobenzaprine (FLEXERIL) 5 MG tablet Take 1 tablet (5 mg total) by mouth daily as needed for muscle spasms.  Marland Kitchen escitalopram (LEXAPRO) 10 MG tablet Take 1 tablet (10 mg total) by mouth daily.  Marland Kitchen lamoTRIgine (LAMICTAL) 100 MG tablet Take 1 tablet (100 mg total) by mouth daily.  Marland Kitchen linaclotide (LINZESS) 72 MCG capsule Take 1 capsule (72 mcg total) by mouth daily before breakfast. (Patient taking differently: Take 72 mcg by mouth every other day. )  . loratadine (CLARITIN) 10 MG tablet Take 1 tablet (10 mg total) by mouth daily.  Marland Kitchen losartan (COZAAR) 25 MG tablet TAKE 1 TABLET (25 MG TOTAL) BY MOUTH DAILY.  .  meloxicam (MOBIC) 15 MG tablet TAKE 1 TABLET (15 MG TOTAL) BY MOUTH DAILY.  . Multiple Vitamin (MULTIVITAMIN) tablet Take 1 tablet by mouth daily. Reported on 04/28/2016  . pantoprazole (PROTONIX) 40 MG tablet TAKE 1 TABLET (40 MG TOTAL) BY MOUTH DAILY.  Marland Kitchen Plecanatide (TRULANCE) 3 MG TABS Take 3 mg by mouth daily.  . Prucalopride Succinate (MOTEGRITY) 2 MG TABS Take 1 tablet (2 mg total) by mouth daily.  . traMADol (ULTRAM) 50 MG tablet Take 1-2 tablets (50-100 mg total) by mouth every 6 (six) hours as needed.   No facility-administered encounter medications on file as of 02/28/2020.    Allergies as of 02/28/2020 - Review Complete 02/28/2020  Allergen Reaction Noted  . Benadryl [diphenhydramine hcl] Itching 11/06/2012  . Milk-related compounds Diarrhea 09/22/2016  . Oxycodone-acetaminophen Nausea And Vomiting 02/27/2007  . Penicillins Nausea Only 02/27/2007    Past Medical History:  Diagnosis Date  . AC (acromioclavicular) joint bone spurs   . Acid reflux   . Anxiety   . Arthritis    knees  . CTS (carpal tunnel syndrome)    Bilateral  . Depression   . Gastric ulcer   . GERD (gastroesophageal reflux disease)   . Headache   . Heel spur   . History of chicken pox   . Hypertension   . Morbid obesity (HCC)   . Panic attacks   . Plantar fasciitis   .  Sickle cell trait (HCC)   . Sleep apnea    uses sometimes  . Tendonitis    Feet    Past Surgical History:  Procedure Laterality Date  . CARPAL TUNNEL RELEASE Right 10/26/2018   Procedure: RIGHT CARPAL TUNNEL RELEASE;  Surgeon: Kathryne Hitch, MD;  Location: [facility];  Service: Orthopedics;  Laterality: Right;  . CHOLECYSTECTOMY    . COLONOSCOPY WITH PROPOFOL N/A 01/09/2018   Procedure: COLONOSCOPY WITH PROPOFOL;  Surgeon: Corbin Ade, MD;  [facility];  Service: Endoscopy;  Laterality: N/A;  8:45am  . TUBAL LIGATION  08/29/04  . WISDOM TOOTH EXTRACTION      Family History  Problem  Relation Age of Onset  . Hypertension Father 65       Deceased  . Heart attack Father   . Hypertension Mother        Living  . Arthritis Mother   . Diabetes Mother   . Dementia Mother   . Depression Mother   . Arthritis Sister        x2  . Lung cancer Sister        Deceased  . Cancer Other        Paternal & Maternal Aunts  . Heart attack Maternal Grandmother   . Neuropathy Sister   . Bipolar disorder Sister   . Schizophrenia Sister   . Heart disease Brother        x1  . Alcohol abuse Brother   . Alcoholism Brother        x2  . Colon cancer Neg Hx     Social History   Socioeconomic History  . Marital status: Single    Spouse name: Not on file  . Number of children: 0  . Years of education: Not on file  . Highest education level: Some college, no degree  Occupational History    Comment: not employed   Tobacco Use  . Smoking status: Never Smoker  . Smokeless tobacco: Never Used  Substance and Sexual Activity  . Alcohol use: No  . Drug use: No  . Sexual activity: Not Currently    Birth control/protection: Post-menopausal    Comment: BTL  Other Topics Concern  . Not on file  Social History Narrative  . Not on file   Social Determinants of Health   Financial Resource Strain:   . Difficulty of Paying Living Expenses:   Food Insecurity:   . Worried About Programme researcher, broadcasting/film/video in the Last Year:   . Barista in the Last Year:   Transportation Needs:   . Freight forwarder (Medical):   Marland Kitchen Lack of Transportation (Non-Medical):   Physical Activity:   . Days of Exercise per Week:   . Minutes of Exercise per Session:   Stress:   . Feeling of Stress :   Social Connections:   . Frequency of Communication with Friends and Family:   . Frequency of Social Gatherings with Friends and Family:   . Attends Religious Services:   . Active Member of Clubs or Organizations:   . Attends Banker Meetings:   Marland Kitchen Marital Status:   Intimate Partner  Violence:   . Fear of Current or Ex-Partner:   . Emotionally Abused:   Marland Kitchen Physically Abused:   . Sexually Abused:     Review of Systems  Constitutional: Negative.   HENT: Negative.   Eyes: Negative.   Respiratory: Positive for apnea.   Cardiovascular:  Negative.   Gastrointestinal: Negative.   Endocrine: Negative.   Genitourinary: Negative.  Negative for vaginal bleeding.  Psychiatric/Behavioral: Positive for sleep disturbance.  All other systems reviewed and are negative.   Vitals:   02/28/20 1555  BP: 114/70  Pulse: 78  Temp: (!) 97 F (36.1 C)  SpO2: 98%     Physical Exam  Constitutional: She appears well-developed and well-nourished.  Obese  HENT:  Head: Normocephalic and atraumatic.  Mallampati 3  Eyes: Pupils are equal, round, and reactive to light.  Neck: No tracheal deviation present. No thyromegaly present.  Cardiovascular: Normal rate, regular rhythm and normal heart sounds.  Pulmonary/Chest: Effort normal and breath sounds normal. No respiratory distress. She has no wheezes.  Abdominal: Soft. Bowel sounds are normal. She exhibits no distension. There is no abdominal tenderness.  Musculoskeletal:     Cervical back: Normal range of motion and neck supple.   Sleep study titration did reveal pressure requirement of 18  Results of the Epworth flowsheet 12/29/2018 09/28/2018  Sitting and reading 0 3  Watching TV 3 3  Sitting, inactive in a public place (e.g. a theatre or a meeting) 0 3  As a passenger in a car for an hour without a break 0 0  Lying down to rest in the afternoon when circumstances permit 0 3  Sitting and talking to someone 0 0  Sitting quietly after a lunch without alcohol 0 0  In a car, while stopped for a few minutes in traffic 0 0  Total score 3 12   Compliance data reveals less than 20% compliance with CPAP use, apneic events are well controlled with AHI of 0.3  Assessment:    Obstructive sleep apnea -Compliance is  poor  Obesity -Continues to work on losing weight   Plan/Recommendations:  Continue CPAP therapy at 15  Follow-up CPAP download  Continue weight loss efforts-encouraged to step up efforts at weight loss including diet and graded exercises  I will see you back in the office in 3 months    Sherrilyn Rist MD [facility] 02/28/2020, 4:01 PM  CC: Ladell Pier, MD

## 2020-02-28 NOTE — Patient Instructions (Signed)
Xyzal once a day instead of Zyrtec Use your CPAP on a nightly basis  Regular exercises on diet    I will see you in about 3 months Call with any significant concerns

## 2020-03-12 MED FILL — lamoTRIgine 100 MG TABS: 100 | 30 days supply | Qty: 30 | Fill #0

## 2020-03-12 MED FILL — ESCITALOPRAM 10 MG TABLET: 10 | 30 days supply | Qty: 30 | Fill #0

## 2020-03-13 ENCOUNTER — Ambulatory Visit: Payer: Self-pay | Attending: Internal Medicine

## 2020-03-13 ENCOUNTER — Other Ambulatory Visit: Payer: Self-pay

## 2020-03-13 DIAGNOSIS — E876 Hypokalemia: Secondary | ICD-10-CM

## 2020-03-14 LAB — POTASSIUM: Potassium: 3.5 mmol/L (ref 3.5–5.2)

## 2020-03-15 ENCOUNTER — Telehealth: Payer: Self-pay

## 2020-03-15 NOTE — Telephone Encounter (Signed)
Contacted pt go over lab results pt is aware and doesn't have any questions or concerns  

## 2020-03-20 ENCOUNTER — Ambulatory Visit (HOSPITAL_BASED_OUTPATIENT_CLINIC_OR_DEPARTMENT_OTHER): Payer: Self-pay | Admitting: Family

## 2020-03-20 DIAGNOSIS — Z5329 Procedure and treatment not carried out because of patient's decision for other reasons: Secondary | ICD-10-CM

## 2020-03-20 NOTE — Progress Notes (Signed)
Patient did not show for appointment.   

## 2020-03-25 ENCOUNTER — Other Ambulatory Visit: Payer: Self-pay | Admitting: Internal Medicine

## 2020-03-25 DIAGNOSIS — M1711 Unilateral primary osteoarthritis, right knee: Secondary | ICD-10-CM

## 2020-03-25 DIAGNOSIS — I10 Essential (primary) hypertension: Secondary | ICD-10-CM

## 2020-03-25 MED FILL — LOSARTAN POTASSIUM 25 MG TA: 25 | 30 days supply | Qty: 30 | Fill #0

## 2020-03-25 MED FILL — ?AMLODIPINE BESYL 10MG TABL: 10 | 30 days supply | Qty: 30 | Fill #2

## 2020-03-25 MED FILL — MELOXICAM 15 MG TABLET: 15 | 30 days supply | Qty: 30 | Fill #0

## 2020-03-28 ENCOUNTER — Other Ambulatory Visit: Payer: Self-pay | Admitting: Internal Medicine

## 2020-03-28 MED FILL — ?PANTOPRAZOLE SO DR 40MG TA: 40 | 30 days supply | Qty: 30 | Fill #2

## 2020-03-28 MED FILL — ?ATORVASTATIN 20 MG TABLET: 20 | 30 days supply | Qty: 30 | Fill #0

## 2020-04-04 ENCOUNTER — Other Ambulatory Visit: Payer: Self-pay | Admitting: *Deleted

## 2020-04-04 DIAGNOSIS — Z1231 Encounter for screening mammogram for malignant neoplasm of breast: Secondary | ICD-10-CM

## 2020-04-08 MED FILL — lamoTRIgine 100 MG TABS: 100 | 30 days supply | Qty: 30 | Fill #1

## 2020-04-08 MED FILL — ESCITALOPRAM 10 MG TABLET: 10 | 30 days supply | Qty: 30 | Fill #1

## 2020-04-14 ENCOUNTER — Other Ambulatory Visit: Payer: Self-pay | Admitting: Pulmonary Disease

## 2020-04-14 DIAGNOSIS — G4733 Obstructive sleep apnea (adult) (pediatric): Secondary | ICD-10-CM

## 2020-04-17 ENCOUNTER — Telehealth: Payer: Self-pay | Admitting: Internal Medicine

## 2020-04-17 ENCOUNTER — Inpatient Hospital Stay: Admission: RE | Admit: 2020-04-17 | Payer: Self-pay | Source: Ambulatory Visit

## 2020-04-17 DIAGNOSIS — S99922A Unspecified injury of left foot, initial encounter: Secondary | ICD-10-CM

## 2020-04-17 NOTE — Telephone Encounter (Signed)
Patient called in and requested for a referral for podiatry. Please follow up at your earliest convenience.

## 2020-04-18 NOTE — Telephone Encounter (Signed)
Will forward to pcp

## 2020-04-21 ENCOUNTER — Other Ambulatory Visit: Payer: Self-pay | Admitting: Internal Medicine

## 2020-04-21 DIAGNOSIS — M1711 Unilateral primary osteoarthritis, right knee: Secondary | ICD-10-CM

## 2020-04-21 DIAGNOSIS — I10 Essential (primary) hypertension: Secondary | ICD-10-CM

## 2020-04-22 MED FILL — ?AMLODIPINE BESYL 10MG TABL: 10 | 30 days supply | Qty: 30 | Fill #0

## 2020-04-23 ENCOUNTER — Other Ambulatory Visit: Payer: Self-pay | Admitting: Internal Medicine

## 2020-04-23 DIAGNOSIS — M1711 Unilateral primary osteoarthritis, right knee: Secondary | ICD-10-CM

## 2020-04-23 NOTE — Telephone Encounter (Signed)
Requested medication (s) are due for refill today:   Yes  Requested medication (s) are on the active medication list:   Yes  Future visit scheduled:   Yes on 05/19/2020   Last ordered: 03/25/2020 #30 with 0 refills.  Returned so provider could decide on refilling this or not.   This request was refused yesterday because she needed an appt.   She made one however I see where she has been a No Show for her last 2 appts.     Requested Prescriptions  Pending Prescriptions Disp Refills   meloxicam (MOBIC) 15 MG tablet 30 tablet 0    Sig: Take 1 tablet (15 mg total) by mouth daily.      Analgesics:  COX2 Inhibitors Failed - 04/23/2020  2:20 PM      Failed - Cr in normal range and within 360 days    Creatinine, Ser  Date Value Ref Range Status  07/23/2019 1.02 (H) 0.57 - 1.00 mg/dL Final          Passed - HGB in normal range and within 360 days    Hemoglobin  Date Value Ref Range Status  07/23/2019 12.8 11.1 - 15.9 g/dL Final          Passed - Patient is not pregnant      Passed - Valid encounter within last 12 months    Recent Outpatient Visits           1 month ago No-show for appointment   [facility] [place], [place], [place]   1 month ago No-show for appointment   [facility] [place], [place], [place]   4 months ago Essential hypertension   [facility] Marcine Matar, MD   9 months ago Essential hypertension   [facility] Marcine Matar, MD   1 year ago Essential hypertension   [facility] Marcine Matar, MD       Future Appointments             In 3 weeks Laural Benes Binnie Rail, MD [facility]

## 2020-04-23 NOTE — Telephone Encounter (Signed)
Pt states her big toenail has turned black and it hurts. Wants to see if she can get it removed

## 2020-04-23 NOTE — Telephone Encounter (Signed)
Pt request refill  meloxicam (MOBIC) 15 MG tablet  Pt made appt for 7/26 (first available)  [facility] - [place], [place] [address] Phone:  [phone]  Fax:  [phone]

## 2020-04-24 ENCOUNTER — Other Ambulatory Visit: Payer: Self-pay | Admitting: Internal Medicine

## 2020-04-24 DIAGNOSIS — M1711 Unilateral primary osteoarthritis, right knee: Secondary | ICD-10-CM

## 2020-04-24 NOTE — Telephone Encounter (Signed)
Contacted pt and made aware that Dr. Laural Benes has placed a podiatry referral pt doesn't have any questions or concerns

## 2020-04-24 NOTE — Telephone Encounter (Signed)
Will route to pcp 

## 2020-04-24 NOTE — Telephone Encounter (Signed)
Requested medication (s) are due for refill today:   Yes  Requested medication (s) are on the active medication list:   Yes  Future visit scheduled:   Yes in 3 wks with Dr. Laural Benes   Last ordered: 03/25/2020 #30 with 0 refills  Returned because she has been a No Show for her last 2 appts.   This was refused on 04/22/2020.  Provider to review for refill.   Requested Prescriptions  Pending Prescriptions Disp Refills   meloxicam (MOBIC) 15 MG tablet [Pharmacy Med Name: MELOXICAM 15 MG TABLET 15 Tablet] 26 tablet 0    Sig: TAKE 1 TABLET (15 MG TOTAL) BY MOUTH DAILY.      Analgesics:  COX2 Inhibitors Failed - 04/24/2020  4:41 PM      Failed - Cr in normal range and within 360 days    Creatinine, Ser  Date Value Ref Range Status  07/23/2019 1.02 (H) 0.57 - 1.00 mg/dL Final          Passed - HGB in normal range and within 360 days    Hemoglobin  Date Value Ref Range Status  07/23/2019 12.8 11.1 - 15.9 g/dL Final          Passed - Patient is not pregnant      Passed - Valid encounter within last 12 months    Recent Outpatient Visits           1 month ago No-show for appointment   [facility] [place], [place]   1 month ago No-show for appointment   [facility] [place], [place], [place]   4 months ago Essential hypertension   [facility] Marcine Matar, MD   9 months ago Essential hypertension   [facility] Marcine Matar, MD   1 year ago Essential hypertension   [place] [facility] Marcine Matar, MD       Future Appointments             In 3 weeks Laural Benes Binnie Rail, MD [facility]

## 2020-04-25 NOTE — Telephone Encounter (Signed)
Pt was sent a copy of her CAFA letter by mail

## 2020-04-29 ENCOUNTER — Other Ambulatory Visit: Payer: Self-pay | Admitting: Internal Medicine

## 2020-04-29 ENCOUNTER — Ambulatory Visit
Admission: RE | Admit: 2020-04-29 | Discharge: 2020-04-29 | Disposition: A | Discharge status: Home / Self Care | Payer: No Typology Code available for payment source | Source: Ambulatory Visit | Attending: Internal Medicine | Admitting: Internal Medicine

## 2020-04-29 ENCOUNTER — Other Ambulatory Visit: Payer: Self-pay

## 2020-04-29 DIAGNOSIS — Z1231 Encounter for screening mammogram for malignant neoplasm of breast: Secondary | ICD-10-CM

## 2020-04-29 DIAGNOSIS — I10 Essential (primary) hypertension: Secondary | ICD-10-CM

## 2020-04-29 DIAGNOSIS — M1711 Unilateral primary osteoarthritis, right knee: Secondary | ICD-10-CM

## 2020-04-29 MED FILL — ?PANTOPRAZOLE SODI DR 40MGT: 40 | 30 days supply | Qty: 30 | Fill #0

## 2020-04-29 MED FILL — LOSARTAN POTASSIUM 25 MG TA: 25 | 30 days supply | Qty: 30 | Fill #0

## 2020-04-29 MED FILL — ?ATORVASTATIN 20 MG TABLET: 20 | 30 days supply | Qty: 30 | Fill #1

## 2020-04-29 NOTE — Telephone Encounter (Signed)
Patient is calling again to find out if provider okay the request for this medication. Please advise

## 2020-04-29 NOTE — Telephone Encounter (Signed)
PT need a refill  meloxicam (MOBIC) 15 MG tablet [[id number]]  [facility] - [place], [place] - [place] [address]  [address] [place] [address]  Phone: [phone] Fax: [phone]

## 2020-05-01 ENCOUNTER — Other Ambulatory Visit: Payer: Self-pay | Admitting: Internal Medicine

## 2020-05-01 MED ORDER — MELOXICAM 15 MG PO TABS: 15.0000 mg | ORAL_TABLET | Freq: Every day | ORAL | 0 refills | Status: DC

## 2020-05-01 MED ORDER — MELOXICAM 15 MG PO TABS: 15.0000 mg | ORAL_TABLET | Freq: Every day | ORAL | 3 refills | Status: DC

## 2020-05-01 NOTE — Telephone Encounter (Signed)
Refill requested

## 2020-05-02 MED FILL — MELOXICAM 15 MG TABLET: 15 | 30 days supply | Qty: 30 | Fill #0

## 2020-05-08 ENCOUNTER — Other Ambulatory Visit: Payer: Self-pay

## 2020-05-08 ENCOUNTER — Ambulatory Visit (INDEPENDENT_AMBULATORY_CARE_PROVIDER_SITE_OTHER): Payer: No Typology Code available for payment source

## 2020-05-08 ENCOUNTER — Ambulatory Visit: Payer: No Typology Code available for payment source | Admitting: Podiatry

## 2020-05-08 ENCOUNTER — Encounter: Payer: Self-pay | Admitting: Podiatry

## 2020-05-08 ENCOUNTER — Other Ambulatory Visit: Payer: Self-pay | Admitting: Podiatry

## 2020-05-08 VITALS — BP 105/60 | HR 78

## 2020-05-08 DIAGNOSIS — M79674 Pain in right toe(s): Secondary | ICD-10-CM

## 2020-05-08 DIAGNOSIS — L602 Onychogryphosis: Secondary | ICD-10-CM

## 2020-05-08 DIAGNOSIS — S92031S Displaced avulsion fracture of tuberosity of right calcaneus, sequela: Secondary | ICD-10-CM

## 2020-05-08 DIAGNOSIS — B351 Tinea unguium: Secondary | ICD-10-CM

## 2020-05-08 DIAGNOSIS — M7751 Other enthesopathy of right foot: Secondary | ICD-10-CM

## 2020-05-08 DIAGNOSIS — M19171 Post-traumatic osteoarthritis, right ankle and foot: Secondary | ICD-10-CM

## 2020-05-08 MED ORDER — KETOCONAZOLE 2 % EX CREA
1.0000 | TOPICAL_CREAM | Freq: Every day | CUTANEOUS | 2 refills | Status: DC
Start: 2020-05-08 — End: 2022-05-12

## 2020-05-08 MED FILL — KETOCONAZOLE 2% CREAM: 2 | 15 days supply | Qty: 30 | Fill #0

## 2020-05-08 NOTE — Progress Notes (Signed)
°  Subjective:  Patient ID: Amy Perez, female    DOB: 09-23-1966,  MRN: [id number]  Chief Complaint  Patient presents with   Nail Problem    injury to R hallux toe;cut and cut too far and is turning black in color    54 y.o. female presents with the above complaint. History confirmed with patient. Right hallux nail has been thick and very painful for many years, she would like it removed. C/O dry scaling itchy skin on both feet as well. She has a hx of a R ankle fx that was treated non operatively and still has some pain and stiffness in the front of the joint.  Objective:  Physical Exam: warm, good capillary refill, no trophic changes or ulcerative lesions, normal DP and PT pulses and normal sensory exam. Mild non pitting edema bilateral LE, diffuse. Left Foot: normal exam, no swelling, tenderness, instability; ligaments intact, full range of motion of all ankle/foot joints  Right Foot: R ankle DF limited with mild pain along anterolateral joint line with DF.  Plantarflexion strength of the ankle is slightly reduced at 4+ out of 5 compared to the left side.  Hallux nail thick, mycotic, gryphotic.     Radiographs: X-ray of right ankle: Mild osteophytic lipping of the anterior joint line, there is also a previously avulsed posterior superior calcaneal fragment of the Achilles tendon. Assessment:   1. Tendonitis of ankle, right   2. Post-traumatic arthritis of right ankle   3. Onychogryphosis   4. Onychomycosis   5. Pain due to onychomycosis of toenail of right foot      Plan:  Patient was evaluated and treated and all questions answered.   -XR reviewed with patient -She has a previous avulsion fracture of the Achilles tendon of the posterior superior portion of the calcaneus.  She has mild plantar flexion deficit.  I discussed with her treatment options in detail including bracing, physical therapy, and the role of surgery.  At this point it is only minimally  bothersome, we will continue to monitor and consider therapy in the future if we need to.  -She has mild posttraumatic arthritis of the right ankle.  Again this is only moderately bothersome for her, and we will consider injection therapy and or MRI if indicated in the future.   -Patient elects to proceed with gryphotic and mycotic toenail removal today -Mycotic and gryphotic nail excised. See procedure note.  -Sent for nail culture. Pending results will treat with AF therapy -Educated on post-procedure care including soaking. Written instructions provided. -Patient to follow up in 2 weeks for nail check.  Procedure: Avulsion of toenail Location: Right 1st toe  Anesthesia: Lidocaine 1% plain; 1.5 mL and Marcaine 0.5% plain; 1.5 mL, digital block. Skin Prep: Betadine. Dressing: triple antibiotic ointment; telfa; dry, sterile, compression dressing. Technique: Following skin prep, the toe was exsanguinated and a tourniquet was secured at the base of the toe. The nail was freed and avulsed with a hemostat. The area was cleansed. The tourniquet was then removed and sterile dressing applied. Disposition: Patient tolerated procedure well.    Return in about 2 weeks (around 05/22/2020) for right nail check.

## 2020-05-08 NOTE — Patient Instructions (Signed)
EPSOM SALT FOOT SOAK INSTRUCTIONS  *IF YOU HAVE BEEN PRESCRIBED ANTIBIOTICS, TAKE AS INSTRUCTED UNTIL ALL ARE GONE*  Shopping List:  A. Plain epsom salt (not scented) B. Neosporin Cream/Ointment or Bacitracin Cream/Ointment (or prescribed antiobiotic drops/cream/ointment) C. 1-inch fabric band-aids  1.  Place 1/4 cup of epsom salts in 2 quarts of warm tap water. IF YOU ARE DIABETIC, OR HAVE NEUROPATHY, CHECK THE TEMPERATURE OF THE WATER WITH YOUR ELBOW.  2.  Submerge your foot/feet in the solution and soak for 10-15 minutes.      3.  Next, remove your foot/feet from solution, blot dry the affected area.    4.  Apply light amount of antibiotic cream/ointment and cover with fabric band-aid .  5.  This soak should be done once a day for 7 days.   6.  Monitor for any signs/symptoms of infection such as redness, swelling, odor, drainage, increased pain, or non-healing of digit.   7.  Please do not hesitate to call the office and speak to a Nurse or Doctor if you have questions.   8.  If you experience fever, chills, nightsweats, nausea or vomiting with worsening of digit, please go to the emergency room.   

## 2020-05-09 MED FILL — lamoTRIgine 100 MG TABS: 100 | 30 days supply | Qty: 30 | Fill #2

## 2020-05-09 MED FILL — ESCITALOPRAM 10 MG TABLET: 10 | 30 days supply | Qty: 30 | Fill #2

## 2020-05-19 ENCOUNTER — Other Ambulatory Visit: Payer: Self-pay

## 2020-05-19 ENCOUNTER — Other Ambulatory Visit: Payer: Self-pay | Admitting: Family

## 2020-05-19 ENCOUNTER — Ambulatory Visit: Payer: Self-pay | Attending: Internal Medicine | Admitting: Family

## 2020-05-19 ENCOUNTER — Encounter: Payer: Self-pay | Admitting: Family

## 2020-05-19 VITALS — BP 125/80 | HR 87 | Resp 16 | Wt 294.6 lb

## 2020-05-19 DIAGNOSIS — Z124 Encounter for screening for malignant neoplasm of cervix: Secondary | ICD-10-CM

## 2020-05-19 DIAGNOSIS — G4733 Obstructive sleep apnea (adult) (pediatric): Secondary | ICD-10-CM

## 2020-05-19 DIAGNOSIS — E782 Mixed hyperlipidemia: Secondary | ICD-10-CM

## 2020-05-19 DIAGNOSIS — I10 Essential (primary) hypertension: Secondary | ICD-10-CM

## 2020-05-19 DIAGNOSIS — Z9989 Dependence on other enabling machines and devices: Secondary | ICD-10-CM

## 2020-05-19 MED ORDER — ATORVASTATIN CALCIUM 20 MG PO TABS: 20.0000 mg | ORAL_TABLET | Freq: Every day | ORAL | 2 refills | Status: DC

## 2020-05-19 MED ORDER — LOSARTAN POTASSIUM 25 MG PO TABS: 25.0000 mg | ORAL_TABLET | Freq: Every day | ORAL | 2 refills | Status: DC

## 2020-05-19 MED ORDER — AMLODIPINE BESYLATE 10 MG PO TABS: 10.0000 mg | ORAL_TABLET | Freq: Every day | ORAL | 2 refills | Status: DC

## 2020-05-19 MED FILL — LOSARTAN POTASSIUM 25 MG TA: 25 | 30 days supply | Qty: 30 | Fill #0

## 2020-05-19 MED FILL — ?AMLODIPINE BESYL 10MG TABL: 10 | 30 days supply | Qty: 30 | Fill #0

## 2020-05-19 MED FILL — ?ATORVASTATIN 20 MG TABLET: 20 | 30 days supply | Qty: 30 | Fill #0

## 2020-05-19 NOTE — Patient Instructions (Signed)
Continue Amlodipine and Losartan for high blood pressure. Continue Atorvastatin for high cholesterol. Lab today. PAP smear today. Follow-up with primary physician in 3 months or sooner if needed. Hypertension, Adult Hypertension is another name for high blood pressure. High blood pressure forces your heart to work harder to pump blood. This can cause problems over time. There are two numbers in a blood pressure reading. There is a top number (systolic) over a bottom number (diastolic). It is best to have a blood pressure that is below 120/80. Healthy choices can help lower your blood pressure, or you may need medicine to help lower it. What are the causes? The cause of this condition is not known. Some conditions may be related to high blood pressure. What increases the risk?  Smoking.  Having type 2 diabetes mellitus, high cholesterol, or both.  Not getting enough exercise or physical activity.  Being overweight.  Having too much fat, sugar, calories, or salt (sodium) in your diet.  Drinking too much alcohol.  Having long-term (chronic) kidney disease.  Having a family history of high blood pressure.  Age. Risk increases with age.  Race. You may be at higher risk if you are African American.  Gender. Men are at higher risk than women before age 59. After age 5, women are at higher risk than men.  Having obstructive sleep apnea.  Stress. What are the signs or symptoms?  High blood pressure may not cause symptoms. Very high blood pressure (hypertensive crisis) may cause: ? Headache. ? Feelings of worry or nervousness (anxiety). ? Shortness of breath. ? Nosebleed. ? A feeling of being sick to your stomach (nausea). ? Throwing up (vomiting). ? Changes in how you see. ? Very bad chest pain. ? Seizures. How is this treated?  This condition is treated by making healthy lifestyle changes, such as: ? Eating healthy foods. ? Exercising more. ? Drinking less alcohol.  Your  health care provider may prescribe medicine if lifestyle changes are not enough to get your blood pressure under control, and if: ? Your top number is above 130. ? Your bottom number is above 80.  Your personal target blood pressure may vary. Follow these instructions at home: Eating and drinking   If told, follow the DASH eating plan. To follow this plan: ? Fill one half of your plate at each meal with fruits and vegetables. ? Fill one fourth of your plate at each meal with whole grains. Whole grains include whole-wheat pasta, brown rice, and whole-grain bread. ? Eat or drink low-fat dairy products, such as skim milk or low-fat yogurt. ? Fill one fourth of your plate at each meal with low-fat (lean) proteins. Low-fat proteins include fish, chicken without skin, eggs, beans, and tofu. ? Avoid fatty meat, cured and processed meat, or chicken with skin. ? Avoid pre-made or processed food.  Eat less than 1,500 mg of salt each day.  Do not drink alcohol if: ? Your doctor tells you not to drink. ? You are pregnant, may be pregnant, or are planning to become pregnant.  If you drink alcohol: ? Limit how much you use to:  0-1 drink a day for women.  0-2 drinks a day for men. ? Be aware of how much alcohol is in your drink. In the [place] one drink equals one 12 oz bottle of beer (355 mL), one 5 oz glass of wine (148 mL), or one 1 oz glass of hard liquor (44 mL). Lifestyle   Work with your doctor  to stay at a healthy weight or to lose weight. Ask your doctor what the best weight is for you.  Get at least 30 minutes of exercise most days of the week. This may include walking, swimming, or biking.  Get at least 30 minutes of exercise that strengthens your muscles (resistance exercise) at least 3 days a week. This may include lifting weights or doing Pilates.  Do not use any products that contain nicotine or tobacco, such as cigarettes, e-cigarettes, and chewing tobacco. If you need help  quitting, ask your doctor.  Check your blood pressure at home as told by your doctor.  Keep all follow-up visits as told by your doctor. This is important. Medicines  Take over-the-counter and prescription medicines only as told by your doctor. Follow directions carefully.  Do not skip doses of blood pressure medicine. The medicine does not work as well if you skip doses. Skipping doses also puts you at risk for problems.  Ask your doctor about side effects or reactions to medicines that you should watch for. Contact a doctor if you:  Think you are having a reaction to the medicine you are taking.  Have headaches that keep coming back (recurring).  Feel dizzy.  Have swelling in your ankles.  Have trouble with your vision. Get help right away if you:  Get a very bad headache.  Start to feel mixed up (confused).  Feel weak or numb.  Feel faint.  Have very bad pain in your: ? Chest. ? Belly (abdomen).  Throw up more than once.  Have trouble breathing. Summary  Hypertension is another name for high blood pressure.  High blood pressure forces your heart to work harder to pump blood.  For most people, a normal blood pressure is less than 120/80.  Making healthy choices can help lower blood pressure. If your blood pressure does not get lower with healthy choices, you may need to take medicine. This information is not intended to replace advice given to you by your health care provider. Make sure you discuss any questions you have with your health care provider. Document Revised: 06/21/2018 Document Reviewed: 06/21/2018 Elsevier Patient Education  2020 [employer].

## 2020-05-19 NOTE — Progress Notes (Signed)
[facility]    DOB: 1965/11/21  MRN: [id number]  CC: Chronic Conditions Follow-Up and PAP Smear  Subjective: Amy Perez is a 54 y.o. female with history of essential hypertension, obstructive sleep apnea, GERD without esophagitis, carpal tunnel syndrome, generalized anxiety disorder, insomnia, and mixed hyperlipidemia who presents for chronic conditions follow-up and PAP smear.   1. HYPERTENSION FOLLOW-UP: Currently taking: see medication list Have you taken your blood pressure medication today: [x]  Yes []  No  Med Adherence: [x]  Yes    []  No  Medication side effects: []  Yes    [x]  No Adherence with salt restriction: []  Yes    [x]  No Exercise: Yes []  No [x]  Home Monitoring?: [x]  Yes    []  No Monitoring Frequency: []  Yes    [x]  No Home BP results range: [x]  Yes    []  No 109-135/80 Smoking []  Yes [x]  No  SOB? []  Yes    [x]  No Chest Pain?: []  Yes    [x]  No Leg swelling?: []  Yes    [x]  No Headaches?: []  Yes    [x]  No Dizziness? []  Yes    [x]  No Comments: Last visit 11/27/2019 with Dr. Laural BenesJohnson. During that encounter patient continued on Losartan and Amlodipine.   2. CHOLESTEROL FOLLOW-UP: Hyperlipidemia status: good compliance Satisfied with current treatment?  yes Side effects:  no Medication compliance: good compliance Past cholesterol meds: atorvastain (lipitor) Supplements: none Aspirin:  no The 10-year ASCVD risk score Denman George(Goff DC Jr., et al., 2013) is: 4.4%   Values used to calculate the score:     Age: [age] years     Sex: Female     Is Non-Hispanic African American: Yes     Diabetic: No     Tobacco smoker: No     Systolic Blood Pressure: 125 mmHg     Is BP treated: Yes     HDL Cholesterol: 51 mg/dL     Total Cholesterol: 207 mg/dL Chest pain:  no Last visit 11/27/2019 with Dr. Laural BenesJohnson. During that encounter continued on Lipitor.   2. SLEEP APNEA FOLLOW-UP:  Last visit 11/27/2019 with Dr. Laural BenesJohnson. During that encounter patient  encouraged to be compliant with using her CPAP and reported she was saving up for a new mask for her device.   Today reports she has still not been able to get the correct mask for CPAP related to financial reasons and currently using the same mask.  4. PAP SMEAR: Age at menarche: 54 years old Age at menopause: [age] Pregnancies: 4 Births: 0 Abortions: 0 Miscarriages: 4 Sexual-health concerns: denies Sexually active: denies Vaginal discharge: denies Vaginal lesions: denies Pelvic pain: denies Uterine fibroids: denies Urinary incontinence: denies Bowel incontinence: denies Last PAP: 2 or 3 years ago Results of last PAP: reports she has had abnormal pap and colposcopy which ended up being fine Testing for STIs today? denies Testing for HIV today? denies Colonoscopy: 2019 Mammogram: June 2021   Patient Active Problem List   Diagnosis Date Noted  . Mixed hyperlipidemia 11/27/2019  . Abdominal pain 08/21/2019  . Moderate episode of recurrent major depressive disorder (HCC) 11/30/2018  . Carpal tunnel syndrome, right upper limb 10/26/2018  . OSA (obstructive sleep apnea) 07/27/2018  . Hypomagnesemia 03/27/2018  . Constipation 12/08/2017  . Taking multiple medications for chronic disease 12/08/2017  . Primary osteoarthritis of right knee 04/21/2017  . Acute bacterial sinusitis 11/07/2016  . Insomnia 07/02/2016  . Carpal tunnel syndrome 06/29/2016  . Gastroesophageal reflux disease without esophagitis 04/26/2016  .  Essential hypertension 04/26/2016  . Generalized anxiety disorder 04/26/2016  . OBESITY, MORBID 03/01/2007     Current Outpatient Medications on File Prior to Visit  Medication Sig Dispense Refill  . amLODipine (NORVASC) 10 MG tablet Take 1 tablet (10 mg total) by mouth daily. Must have office visit for refills 30 tablet 0  . atorvastatin (LIPITOR) 20 MG tablet TAKE 1 TABLET (20 MG TOTAL) BY MOUTH DAILY. 30 tablet 2  . cyclobenzaprine (FLEXERIL) 5 MG tablet Take 1  tablet (5 mg total) by mouth daily as needed for muscle spasms. 30 tablet 1  . escitalopram (LEXAPRO) 10 MG tablet Take 1 tablet (10 mg total) by mouth daily. 90 tablet 0  . ketoconazole (NIZORAL) 2 % cream Apply 1 application topically daily. 60 g 2  . lamoTRIgine (LAMICTAL) 100 MG tablet Take 1 tablet (100 mg total) by mouth daily. 90 tablet 0  . levocetirizine (XYZAL) 5 MG tablet Take 1 tablet (5 mg total) by mouth every evening. 31 tablet 5  . linaclotide (LINZESS) 72 MCG capsule Take 1 capsule (72 mcg total) by mouth daily before breakfast. (Patient taking differently: Take 72 mcg by mouth every other day. ) 30 capsule 1  . loratadine (CLARITIN) 10 MG tablet Take 1 tablet (10 mg total) by mouth daily. 30 tablet 1  . losartan (COZAAR) 25 MG tablet TAKE 1 TABLET (25 MG TOTAL) BY MOUTH DAILY. 30 tablet 0  . meloxicam (MOBIC) 15 MG tablet Take 1 tablet (15 mg total) by mouth daily. 30 tablet 3  . meloxicam (MOBIC) 15 MG tablet Take 1 tablet (15 mg total) by mouth daily. 30 tablet 0  . Multiple Vitamin (MULTIVITAMIN) tablet Take 1 tablet by mouth daily. Reported on 04/28/2016    . pantoprazole (PROTONIX) 40 MG tablet TAKE 1 TABLET (40 MG TOTAL) BY MOUTH DAILY. 30 tablet 2  . Plecanatide (TRULANCE) 3 MG TABS Take 3 mg by mouth daily. 30 tablet 2  . Prucalopride Succinate (MOTEGRITY) 2 MG TABS Take 1 tablet (2 mg total) by mouth daily. 30 tablet 3  . traMADol (ULTRAM) 50 MG tablet Take 1-2 tablets (50-100 mg total) by mouth every 6 (six) hours as needed. 30 tablet 0   No current facility-administered medications on file prior to visit.    Allergies  Allergen Reactions  . Benadryl [Diphenhydramine Hcl] Itching  . Milk-Related Compounds Diarrhea  . Oxycodone-Acetaminophen Nausea And Vomiting  . Penicillins Nausea Only    Amoxicillin Has patient had a PCN reaction causing immediate rash, facial/tongue/throat swelling, SOB or lightheadedness with hypotension:__________ Has patient had a PCN  reaction causing severe rash involving mucus membranes or skin necrosis:________ Has patient had a PCN reaction that required hospitalization:________ Has patient had a PCN reaction occurring within the last 10 years: ________ If all of the above answers are "NO", then may proceed with Cephalosporin use.     Social History   Socioeconomic History  . Marital status: Single    Spouse name: Not on file  . Number of children: 0  . Years of education: Not on file  . Highest education level: Some college, no degree  Occupational History    Comment: not employed   Tobacco Use  . Smoking status: Never Smoker  . Smokeless tobacco: Never Used  Vaping Use  . Vaping Use: Never used  Substance and Sexual Activity  . Alcohol use: No  . Drug use: No  . Sexual activity: Not Currently    Birth control/protection: Post-menopausal  Comment: BTL  Other Topics Concern  . Not on file  Social History Narrative  . Not on file   Social Determinants of Health   Financial Resource Strain:   . Difficulty of Paying Living Expenses:   Food Insecurity:   . Worried About Programme researcher, broadcasting/film/video in the Last Year:   . Barista in the Last Year:   Transportation Needs:   . Freight forwarder (Medical):   Marland Kitchen Lack of Transportation (Non-Medical):   Physical Activity:   . Days of Exercise per Week:   . Minutes of Exercise per Session:   Stress:   . Feeling of Stress :   Social Connections:   . Frequency of Communication with Friends and Family:   . Frequency of Social Gatherings with Friends and Family:   . Attends Religious Services:   . Active Member of Clubs or Organizations:   . Attends Banker Meetings:   Marland Kitchen Marital Status:   Intimate Partner Violence:   . Fear of Current or Ex-Partner:   . Emotionally Abused:   Marland Kitchen Physically Abused:   . Sexually Abused:     Family History  Problem Relation Age of Onset  . Hypertension Father 22       Deceased  . Heart attack  Father   . Hypertension Mother        Living  . Arthritis Mother   . Diabetes Mother   . Dementia Mother   . Depression Mother   . Arthritis Sister        x2  . Lung cancer Sister        Deceased  . Cancer Other        Paternal & Maternal Aunts  . Heart attack Maternal Grandmother   . Neuropathy Sister   . Bipolar disorder Sister   . Schizophrenia Sister   . Heart disease Brother        x1  . Alcohol abuse Brother   . Alcoholism Brother        x2  . Colon cancer Neg Hx     Past Surgical History:  Procedure Laterality Date  . CARPAL TUNNEL RELEASE Right 10/26/2018   Procedure: RIGHT CARPAL TUNNEL RELEASE;  Surgeon: Kathryne Hitch, MD;  Location: [facility];  Service: Orthopedics;  Laterality: Right;  . CHOLECYSTECTOMY    . COLONOSCOPY WITH PROPOFOL N/A 01/09/2018   Procedure: COLONOSCOPY WITH PROPOFOL;  Surgeon: Corbin Ade, MD;  Location: [facility];  Service: Endoscopy;  Laterality: N/A;  8:45am  . TUBAL LIGATION  08/29/04  . WISDOM TOOTH EXTRACTION      ROS: Review of Systems Negative except as stated above  PHYSICAL EXAM: LMP 12/14/2017 Comment: BTL  Vitals with BMI 05/19/2020 05/08/2020 02/28/2020  Height - - 5\' 6"   Weight 294 lbs 10 oz - 298 lbs  BMI - - 48.12  Systolic 125 105  Diastolic 80 60 70  Pulse 87 78 78  Some encounter information is confidential and restricted. Go to Review Flowsheets activity to see all data.  SpO2- 99%, room air  Wt Readings from Last 3 Encounters:  05/19/20 (!) 294 lb 9.6 oz (133.6 kg)  02/28/20 298 lb (135.2 kg)  08/21/19 294 lb 9.6 oz (133.6 kg)    Physical Exam General appearance - alert, well appearing, and in no distress, oriented to person, place, and time and overweight Mental status - alert, oriented to person, place, and time, normal mood,  behavior, speech, dress, motor activity, and thought processes Neck - supple, no significant adenopathy Lymphatics - no palpable lymphadenopathy,  no hepatosplenomegaly Chest - clear to auscultation, no wheezes, rales or rhonchi, symmetric air entry, no tachypnea, retractions or cyanosis Heart - normal rate, regular rhythm, normal S1, S2, no murmurs, rubs, clicks or gallops Abdomen - soft, nontender, nondistended, no masses or organomegaly Breasts - patient declines to have breast exam Pelvic - normal external genitalia, vulva, vagina, cervix, uterus and adnexa, VULVA: normal appearing vulva with no masses, tenderness or lesions, VAGINA: normal appearing vagina with normal color and discharge, no lesions, CERVIX: normal appearing cervix without discharge or lesions, UTERUS: uterus is normal size, shape, consistency and nontender, ADNEXA: normal adnexa in size, nontender and no masses, no palpable internal organs, PAP: Pap smear done today, thin-prep method, HPV test, exam chaperoned by Laruth Bouchard, CMA Neurological - alert, oriented, normal speech, no focal findings or movement disorder noted, cranial nerves II through XII intact, DTR's normal and symmetric, motor and sensory grossly normal bilaterally, normal muscle tone, no tremors, strength 5/5, Romberg sign negative, normal gait and station Musculoskeletal - no joint tenderness, deformity or swelling, no muscular tenderness noted, full range of motion without pain Extremities - peripheral pulses normal, no pedal edema, no clubbing or cyanosis Skin - normal coloration and turgor, no rashes, no suspicious skin lesions noted  ASSESSMENT AND PLAN: 1. Essential hypertension: -Blood pressure at goal today. Blood pressure controlled.  -Continue Amlodipine and Losartan as prescribed.  -BMP to check kidney function and electrolyte balance.  -Counseled on blood pressure goal of less than 130/80, low-sodium, DASH diet, medication compliance, 150 minutes of moderate intensity exercise per week as tolerated. Discussed medication compliance, adverse effects. -Follow-up with primary physician in 3  months or sooner if needed.  - Basic Metabolic Panel - amLODipine (NORVASC) 10 MG tablet; Take 1 tablet (10 mg total) by mouth daily. Must have office visit for refills  Dispense: 30 tablet; Refill: 2 - losartan (COZAAR) 25 MG tablet; Take 1 tablet (25 mg total) by mouth daily.  Dispense: 30 tablet; Refill: 2  2. Mixed hyperlipidemia: -Practice low-fat heart healthy diet and at least 150 minutes of moderate intensity exercise weekly as tolerated.  -Continue Atorvastatin as prescribed. - atorvastatin (LIPITOR) 20 MG tablet; Take 1 tablet (20 mg total) by mouth daily.  Dispense: 30 tablet; Refill: 2  3. OSA on CPAP: -Patient encouraged to continue to be compliant using her CPAP.   4. Morbid obesity (HCC): -Encouraged patient to get some form of moderate intensity exercise at least 3 to 4 days a week. Encourage healthy eating habits and small portion sizes.  5. Cervical cancer screening: -PAP smear to screen for cervical cancer.  - Cytology - PAP  Patient was given the opportunity to ask questions.  Patient verbalized understanding of the plan and was able to repeat key elements of the plan. Patient was given clear instructions to go to Emergency Department or return to medical center if symptoms don't improve, worsen, or new problems develop.The patient verbalized understanding.  Rema Fendt, NP

## 2020-05-20 LAB — BASIC METABOLIC PANEL
BUN/Creatinine Ratio: 7 — ABNORMAL LOW (ref 9–23)
BUN: 8 mg/dL (ref 6–24)
CO2: 23 mmol/L (ref 20–29)
Calcium: 9.7 mg/dL (ref 8.7–10.2)
Chloride: 105 mmol/L (ref 96–106)
Creatinine, Ser: 1.11 mg/dL — ABNORMAL HIGH (ref 0.57–1.00)
GFR calc Af Amer: 66 mL/min/{1.73_m2} (ref >59)
GFR calc non Af Amer: 57 mL/min/{1.73_m2} — ABNORMAL LOW (ref >59)
Glucose: 99 mg/dL (ref 65–99)
Potassium: 3.8 mmol/L (ref 3.5–5.2)
Sodium: 143 mmol/L (ref 134–144)

## 2020-05-21 ENCOUNTER — Telehealth: Payer: Self-pay

## 2020-05-21 NOTE — Progress Notes (Signed)
Please call patient with update.   Kidney function not 100%. The medication Losartan which you are taking for high blood pressure will help protect the kidneys. Follow-up with primary physician at next visit.

## 2020-05-21 NOTE — Telephone Encounter (Signed)
Contacted pt to go over lab results pt is aware and doesn't have any questions or concerns 

## 2020-05-22 ENCOUNTER — Telehealth: Payer: Self-pay

## 2020-05-22 LAB — CYTOLOGY - PAP
Comment: NEGATIVE
Diagnosis: NEGATIVE
High risk HPV: NEGATIVE

## 2020-05-22 NOTE — Telephone Encounter (Signed)
Contacted pt to go over pap results pt is aware and doesn't have any questions or concerns  

## 2020-05-22 NOTE — Progress Notes (Signed)
PAP normal. No signs of cancerous cells.

## 2020-05-23 ENCOUNTER — Ambulatory Visit: Payer: No Typology Code available for payment source | Admitting: Podiatry

## 2020-05-23 ENCOUNTER — Other Ambulatory Visit: Payer: Self-pay

## 2020-05-23 DIAGNOSIS — L602 Onychogryphosis: Secondary | ICD-10-CM

## 2020-05-23 DIAGNOSIS — B351 Tinea unguium: Secondary | ICD-10-CM

## 2020-05-23 MED ORDER — TERBINAFINE HCL 250 MG PO TABS
250.0000 mg | ORAL_TABLET | Freq: Every day | ORAL | 0 refills | Status: AC
Start: 2020-05-23 — End: 2020-08-21

## 2020-05-23 MED FILL — TERBINAFINE HCL 250 MG TABS: 250 | 90 days supply | Qty: 90 | Fill #0

## 2020-05-25 NOTE — Progress Notes (Signed)
  Subjective:  Patient ID: Amy Perez, female    DOB: Jan 12, 1966,  MRN: [id number]  Chief Complaint  Patient presents with  . Follow-up    R hallux - nail check. No more pain/bleeding/drainage. Pt soaks in Epsom salt "every now and then".    54 y.o. female presents with the above complaint. History confirmed with patient.  Doing well since total nail avulsion on the right hallux.  Skin rash is not improving much, she has been using the ketoconazole cream.  Objective:  Physical Exam: warm, good capillary refill, no trophic changes or ulcerative lesions, normal DP and PT pulses and normal sensory exam. Mild non pitting edema bilateral LE, diffuse. Left Foot: normal exam, no swelling, tenderness, instability; ligaments intact, full range of motion of all ankle/foot joints  Right Foot: Right hallux avulsion site healing well.  No signs of infection.  Scaling rash on medial side of arch, pruritic    Assessment:   1. Onychomycosis   2. Onychogryphosis      Plan:  Patient was evaluated and treated and all questions answered.   -Avulsion site has been healing well.  Continue soaks and can begin to leave OTA at night and while at home barefoot. -Her pathology from the nail avulsion has shown onychomycosis.  We will treat with a 9-day course of terbinafine.  Educated on the risk and benefits of this.  We will check her liver function testing in 6 weeks.  This will also help her tinea pedis.  If this is not improving with terbinafine administration will prescribe topical steroid -Return in 3 months for follow-up as the nail regrows  No follow-ups on file.

## 2020-05-26 MED FILL — ?PANTOPRAZOLE SODI DR 40MGT: 40 | 30 days supply | Qty: 30 | Fill #1

## 2020-05-29 MED FILL — MELOXICAM 15 MG TABLET: 15 | 30 days supply | Qty: 30 | Fill #1

## 2020-06-04 ENCOUNTER — Other Ambulatory Visit: Payer: Self-pay | Admitting: Nurse Practitioner

## 2020-06-11 MED FILL — ESCITALOPRAM 10 MG TABLET: 10 | 30 days supply | Qty: 30 | Fill #0

## 2020-06-11 MED FILL — lamoTRIgine 150 MG TABS: 150 | 30 days supply | Qty: 30 | Fill #0

## 2020-06-18 ENCOUNTER — Other Ambulatory Visit: Payer: Self-pay | Admitting: Podiatry

## 2020-06-18 MED FILL — PANTOPRAZOLE SOD DR 40 MG T: 40 | 30 days supply | Qty: 30 | Fill #2

## 2020-06-18 MED FILL — KETOCONAZOLE 2% CREAM: 2 | 15 days supply | Qty: 30 | Fill #1

## 2020-06-18 NOTE — Telephone Encounter (Signed)
Would you like this medication to be refilled?

## 2020-06-19 NOTE — Telephone Encounter (Signed)
She shouldn't need a refill, I sent a 90 day supply last time which is the full course. Did she lose it? I hope she didn't take all of it already.Marland Kitchen

## 2020-06-20 ENCOUNTER — Ambulatory Visit: Payer: No Typology Code available for payment source | Admitting: Pulmonary Disease

## 2020-06-24 ENCOUNTER — Ambulatory Visit: Payer: Self-pay | Admitting: Internal Medicine

## 2020-06-24 NOTE — Telephone Encounter (Signed)
Pt would like to speak with a nurse about her sinus infection.  Call to patient- patient states she has chronic sinus problems and she thinks she has an infection. Patient is requesting antibiotic for treatment. Advised patient she may need appointment- she declines and would like to request medication now- patient advised I will send request- but she may be required to have appointment. She states she understands.  Reason for Disposition . [1] Sinus congestion (pressure, fullness) AND [2] present > 10 days  Answer Assessment - Initial Assessment Questions 1. LOCATION: "Where does it hurt?"      Pressure top of head and in face- pressure 2. ONSET: "When did the sinus pain start?"  (e.g., hours, days)      3 weeks ago- patient was treating it- OTC and allergy medication 3. SEVERITY: "How bad is the pain?"   (Scale 1-10; mild, moderate or severe)   - MILD (1-3): doesn't interfere with normal activities    - MODERATE (4-7): interferes with normal activities (e.g., work or school) or awakens from sleep   - SEVERE (8-10): excruciating pain and patient unable to do any normal activities        moderate 4. RECURRENT SYMPTOM: "Have you ever had sinus problems before?" If Yes, ask: "When was the last time?" and "What happened that time?"      Yes- chronic problems- 1 year- treated with medication 5. NASAL CONGESTION: "Is the nose blocked?" If Yes, ask: "Can you open it or must you breathe through the mouth?"     No- not all the time- but breathing out of mouth 6. NASAL DISCHARGE: "Do you have discharge from your nose?" If so ask, "What color?"    No nasal discharge- coughing up some congestion-whitish 7. FEVER: "Do you have a fever?" If Yes, ask: "What is it, how was it measured, and when did it start?"      no 8. OTHER SYMPTOMS: "Do you have any other symptoms?" (e.g., sore throat, cough, earache, difficulty breathing)     no 9. PREGNANCY: "Is there any chance you are pregnant?" "When was your  last menstrual period?"     n/a  Protocols used: SINUS PAIN OR CONGESTION-A-AH

## 2020-06-24 NOTE — Telephone Encounter (Signed)
Will forward to pcp

## 2020-06-25 NOTE — Telephone Encounter (Signed)
Patient has been scheduled

## 2020-06-25 NOTE — Telephone Encounter (Signed)
Please schedule pt an appt

## 2020-06-26 ENCOUNTER — Other Ambulatory Visit: Payer: Self-pay

## 2020-06-26 ENCOUNTER — Ambulatory Visit: Payer: Self-pay | Attending: Internal Medicine | Admitting: Internal Medicine

## 2020-06-26 ENCOUNTER — Telehealth: Payer: Self-pay | Admitting: Pulmonary Disease

## 2020-06-26 DIAGNOSIS — J069 Acute upper respiratory infection, unspecified: Secondary | ICD-10-CM

## 2020-06-26 DIAGNOSIS — Z9989 Dependence on other enabling machines and devices: Secondary | ICD-10-CM

## 2020-06-26 DIAGNOSIS — G4733 Obstructive sleep apnea (adult) (pediatric): Secondary | ICD-10-CM

## 2020-06-26 MED ORDER — LORATADINE 10 MG PO TABS: 10.0000 mg | ORAL_TABLET | Freq: Every day | ORAL | 1 refills | Status: AC

## 2020-06-26 MED ORDER — FLUTICASONE PROPIONATE 50 MCG/ACT NA SUSP: 1.0000 | Freq: Every day | NASAL | 0 refills | Status: AC

## 2020-06-26 MED FILL — FLUTICASONE PROP 50 MCG SPR: 50 | 30 days supply | Qty: 16 | Fill #0

## 2020-06-26 MED FILL — ?LEVOCETIRIZINE 5 MG TABLET: 5 | 30 days supply | Qty: 30 | Fill #1

## 2020-06-26 MED FILL — LOSARTAN POTASSIUM 25 MG TA: 25 | 30 days supply | Qty: 30 | Fill #1

## 2020-06-26 MED FILL — ?LORATADINE 10 MG TABS: 10 | 30 days supply | Qty: 30 | Fill #0

## 2020-06-26 MED FILL — PROPRANOLOL HCL 40 MG TABS: 40 | 3 days supply | Qty: 6 | Fill #0

## 2020-06-26 MED FILL — HYDROXYZINE PAM 25 MG CAP: 25 | 10 days supply | Qty: 30 | Fill #0

## 2020-06-26 MED FILL — AMLODIPINE BESYLATE 10 MG T: 10 | 30 days supply | Qty: 30 | Fill #1

## 2020-06-26 NOTE — Telephone Encounter (Signed)
Called and spoke with Patient.  Patient stated she is having problems using he cpap machine.  Patient stated cpap is causing her to have hoarseness. Patient stated she is waking at night with chest discomfort, that she is relating to her cpap. Patient stated she is using a full face cpap mask, and feels she may need a new mask, or different type mask.  Patient stated her cpap was donated to her, so she does not have a DME she goes through for supplies. Order was placed 10/03/19 through [facility] for cpap supplies.  Message routed to Dr Wynona Neat to advise

## 2020-06-26 NOTE — Progress Notes (Signed)
Virtual Visit via Telephone Note Due to current restrictions/limitations of in-office visits due to the COVID-19 pandemic, this scheduled clinical appointment was converted to a telehealth visit  I connected with Amy Perez on 06/26/20 at 9:00 a.m EDT by telephone and verified that I am speaking with the correct person using two identifiers. I am in my office.  The patient is at home.  Only the patient and myself participated in this encounter.  I discussed the limitations, risks, security and privacy concerns of performing an evaluation and management service by telephone and the availability of in person appointments. I also discussed with the patient that there may be a patient responsible charge related to this service. The patient expressed understanding and agreed to proceed.   History of Present Illness: Pt with hx of HTN, HL, GERD, anxiety/depression, bone spurs in heels,OA RT knee,morbidity obesity, OSA, CTS RT s/p release, chronic constipation.  Patient was last seen by our nurse practitioner in July.  This is an urgent care visit.  Pt called in a few days ago requesting medications for what she thinks is sinus infection Pt c/o hoareness, chest congestion and HA x 3 wks.  No cough, not much nasal congestion.  No fever, SOB or recent sick contacts. She feels it is her sinus acting up again. Not using anything for her symptoms Completed Pfizer vaccine - 05/16/20, 04/16/20  C/o HA when she uses CPAP. Has appt coming up with Dr. Wynona Neat later this mth.   She is also needing supplies for her CPAP but does not have insurance.  She got her CPAP through a free program. Outpatient Encounter Medications as of 06/26/2020  Medication Sig  . amLODipine (NORVASC) 10 MG tablet Take 1 tablet (10 mg total) by mouth daily. Must have office visit for refills  . atorvastatin (LIPITOR) 20 MG tablet Take 1 tablet (20 mg total) by mouth daily.  . cyclobenzaprine (FLEXERIL) 5 MG tablet Take 1  tablet (5 mg total) by mouth daily as needed for muscle spasms.  Marland Kitchen escitalopram (LEXAPRO) 10 MG tablet Take 1 tablet (10 mg total) by mouth daily.  Marland Kitchen ketoconazole (NIZORAL) 2 % cream Apply 1 application topically daily.  Marland Kitchen lamoTRIgine (LAMICTAL) 100 MG tablet Take 1 tablet (100 mg total) by mouth daily.  Marland Kitchen levocetirizine (XYZAL) 5 MG tablet Take 1 tablet (5 mg total) by mouth every evening.  . linaclotide (LINZESS) 72 MCG capsule Take 1 capsule (72 mcg total) by mouth daily before breakfast. (Patient taking differently: Take 72 mcg by mouth every other day. )  . loratadine (CLARITIN) 10 MG tablet Take 1 tablet (10 mg total) by mouth daily.  Marland Kitchen losartan (COZAAR) 25 MG tablet Take 1 tablet (25 mg total) by mouth daily.  . meloxicam (MOBIC) 15 MG tablet Take 1 tablet (15 mg total) by mouth daily.  . meloxicam (MOBIC) 15 MG tablet Take 1 tablet (15 mg total) by mouth daily.  . Multiple Vitamin (MULTIVITAMIN) tablet Take 1 tablet by mouth daily. Reported on 04/28/2016  . pantoprazole (PROTONIX) 40 MG tablet TAKE 1 TABLET (40 MG TOTAL) BY MOUTH DAILY.  Marland Kitchen Plecanatide (TRULANCE) 3 MG TABS Take 3 mg by mouth daily.  . Prucalopride Succinate (MOTEGRITY) 2 MG TABS Take 1 tablet (2 mg total) by mouth daily.  Marland Kitchen terbinafine (LAMISIL) 250 MG tablet Take 1 tablet (250 mg total) by mouth daily.  . traMADol (ULTRAM) 50 MG tablet Take 1-2 tablets (50-100 mg total) by mouth every 6 (six) hours as needed.  No facility-administered encounter medications on file as of 06/26/2020.      Observations/Objective: No direct observation done as this was a telephone visit.  Assessment and Plan: 1. Viral upper respiratory illness Advised patient understands not likely bacterial sinusitis with more of an acute respiratory viral illness.  Advised patient to have Covid testing done as we have seen a few breakthrough cases of infection with people who have been vaccinated. -To help decrease the congestion, I have refilled  Claritin and have added Flonase nasal spray. - loratadine (CLARITIN) 10 MG tablet; Take 1 tablet (10 mg total) by mouth daily.  Dispense: 30 tablet; Refill: 1 - fluticasone (FLONASE) 50 MCG/ACT nasal spray; Place 1 spray into both nostrils daily.  Dispense: 16 g; Refill: 0 - Novel Coronavirus, NAA ([facility])  2. OSA on CPAP -message sent to [facility] to see what help may be offered for her to get CPAP supplies. -Advised to try the Claritin and Flonase nasal spray as prescribed above to see whether the headaches with use of the CPAP resolves.  If the headaches do not resolve with CPAP use, she will speak with Dr. Wynona Neat about it when she sees him later this month   Follow Up Instructions: PRN   I discussed the assessment and treatment plan with the patient. The patient was provided an opportunity to ask questions and all were answered. The patient agreed with the plan and demonstrated an understanding of the instructions.   The patient was advised to call back or seek an in-person evaluation if the symptoms worsen or if the condition fails to improve as anticipated.  I provided 12 minutes of non-face-to-face time during this encounter.   Jonah Blue, MD

## 2020-06-27 ENCOUNTER — Telehealth: Payer: Self-pay

## 2020-06-27 NOTE — Telephone Encounter (Signed)
We can send in an order for CPAP supplies, see if she can pick something up from [facility]  She may have to pay out-of-pocket  I will only see she should go ahead with this if she feels it is definitely related to mask issues Otherwise, schedule follow-up appointment with either myself or one of the APP's

## 2020-06-27 NOTE — Telephone Encounter (Signed)
At request of Dr Laural Benes, call placed to patient regarding her CPAP mask.   The patient stated that she just needs a new mask, there is no problem with the fit or with the CPAP machine.  She received the machine from the [employer], CPAP assistance program in 2019.  They also offer CPAP masks. Provided her with the number for [employer] and she said she would call and inquire about obtaining a new mask

## 2020-06-28 LAB — NOVEL CORONAVIRUS, NAA: SARS-CoV-2, NAA: NOT DETECTED

## 2020-07-01 ENCOUNTER — Telehealth: Payer: Self-pay

## 2020-07-01 NOTE — Telephone Encounter (Signed)
Contacted pt to go over lab results pt is aware and doesn't have any questions or concerns 

## 2020-07-01 NOTE — Telephone Encounter (Signed)
lmtcb for pt.  

## 2020-07-02 NOTE — Telephone Encounter (Signed)
Tried calling the pt and no answer- LMTCB 

## 2020-07-02 NOTE — Telephone Encounter (Signed)
Pt is returning call - [phone]

## 2020-07-02 NOTE — Telephone Encounter (Signed)
LMTCB

## 2020-07-02 NOTE — Telephone Encounter (Signed)
Pt returning call - please call at [phone] - regarding CPAP supplies

## 2020-07-03 LAB — HEPATIC FUNCTION PANEL
AG Ratio: 1.4 (calc) (ref 1.0–2.5)
ALT: 8 U/L (ref 6–29)
AST: 12 U/L (ref 10–35)
Albumin: 4.2 g/dL (ref 3.6–5.1)
Alkaline phosphatase (APISO): 85 U/L (ref 37–153)
Bilirubin, Direct: 0.1 mg/dL (ref 0.0–0.2)
Globulin: 3 g/dL (calc) (ref 1.9–3.7)
Indirect Bilirubin: 0.5 mg/dL (calc) (ref 0.2–1.2)
Total Bilirubin: 0.6 mg/dL (ref 0.2–1.2)
Total Protein: 7.2 g/dL (ref 6.1–8.1)

## 2020-07-03 NOTE — Telephone Encounter (Signed)
Spoke with patient regarding prior message. Advised patient to contact [employer] to see if patient will have to pay out of pocket for supplies. Patient  also stated about using a online a company.Advised patient she would need a script for that. Patient does have a f/u with Dr.Olalere on 07/16/20. Patient's voice was understanding.

## 2020-07-03 NOTE — Telephone Encounter (Signed)
Pt returning a phone call. Pt can be reached at [phone].

## 2020-07-04 MED FILL — ?ATORVASTATIN 20 MG TABLET: 20 | 30 days supply | Qty: 30 | Fill #1

## 2020-07-04 MED FILL — MELOXICAM 15 MG TABLET: 15 | 30 days supply | Qty: 30 | Fill #2

## 2020-07-07 MED FILL — LAMOTRIGINE 150 MG TABS: 150 | 30 days supply | Qty: 30 | Fill #1

## 2020-07-07 MED FILL — ESCITALOPRAM 10 MG TABLET: 10 | 30 days supply | Qty: 30 | Fill #1

## 2020-07-16 ENCOUNTER — Ambulatory Visit (INDEPENDENT_AMBULATORY_CARE_PROVIDER_SITE_OTHER): Payer: Self-pay | Admitting: Pulmonary Disease

## 2020-07-16 ENCOUNTER — Encounter: Payer: Self-pay | Admitting: Pulmonary Disease

## 2020-07-16 ENCOUNTER — Other Ambulatory Visit: Payer: Self-pay

## 2020-07-16 VITALS — BP 122/80 | HR 94 | Temp 97.4°F | Ht 66.0 in | Wt 297.8 lb

## 2020-07-16 DIAGNOSIS — G4733 Obstructive sleep apnea (adult) (pediatric): Secondary | ICD-10-CM

## 2020-07-16 DIAGNOSIS — Z9989 Dependence on other enabling machines and devices: Secondary | ICD-10-CM

## 2020-07-16 NOTE — Patient Instructions (Signed)
Continue using CPAP on a regular basis  I will follow-up with you in about 3 months  Call with any concerns

## 2020-07-16 NOTE — Progress Notes (Signed)
[facility]    [id number]    August 27, 1966  Primary Care Physician:Johnson, Binnie Rail, MD  Referring Physician: Marcine Matar, MD [address] [place],  [place] [address]  Chief complaint:   Patient with a history of obstructive sleep apnea She feels relatively well    HPI:  Has some difficulty with sleep onset Has been using CPAP on a regular basis Compliant with CPAP use  Still has some symptoms of sleepiness during the day  Has had other social issues of accommodation  Having some issues with depression lately  Currently on CPAP of 15 Tolerating it well  Diagnoses obstructive sleep apnea and titrated to a pressure of 18-pressure was reduced to 15 during last visit and is better tolerated She also did have to change our mask  Stable bedtime, sometimes to go to bed at 3 PM Wakes up a few times during the night Wakes up different times in the mornings  Has been gaining weight History of PTSD, history of depression History of morbid obesity Hypertension   Outpatient Encounter Medications as of 07/16/2020  Medication Sig  . amLODipine (NORVASC) 10 MG tablet Take 1 tablet (10 mg total) by mouth daily. Must have office visit for refills  . atorvastatin (LIPITOR) 20 MG tablet Take 1 tablet (20 mg total) by mouth daily.  . cyclobenzaprine (FLEXERIL) 5 MG tablet Take 1 tablet (5 mg total) by mouth daily as needed for muscle spasms.  Marland Kitchen escitalopram (LEXAPRO) 10 MG tablet Take 1 tablet (10 mg total) by mouth daily.  . fluticasone (FLONASE) 50 MCG/ACT nasal spray Place 1 spray into both nostrils daily.  Marland Kitchen ketoconazole (NIZORAL) 2 % cream Apply 1 application topically daily.  Marland Kitchen lamoTRIgine (LAMICTAL) 100 MG tablet Take 1 tablet (100 mg total) by mouth daily.  Marland Kitchen levocetirizine (XYZAL) 5 MG tablet Take 1 tablet (5 mg total) by mouth every evening.  . linaclotide (LINZESS) 72 MCG capsule Take 1 capsule (72 mcg total) by mouth daily before breakfast.  (Patient taking differently: Take 72 mcg by mouth every other day. )  . loratadine (CLARITIN) 10 MG tablet Take 1 tablet (10 mg total) by mouth daily.  Marland Kitchen losartan (COZAAR) 25 MG tablet Take 1 tablet (25 mg total) by mouth daily.  . meloxicam (MOBIC) 15 MG tablet Take 1 tablet (15 mg total) by mouth daily.  . meloxicam (MOBIC) 15 MG tablet Take 1 tablet (15 mg total) by mouth daily.  . Multiple Vitamin (MULTIVITAMIN) tablet Take 1 tablet by mouth daily. Reported on 04/28/2016  . pantoprazole (PROTONIX) 40 MG tablet TAKE 1 TABLET (40 MG TOTAL) BY MOUTH DAILY.  Marland Kitchen Plecanatide (TRULANCE) 3 MG TABS Take 3 mg by mouth daily.  . Prucalopride Succinate (MOTEGRITY) 2 MG TABS Take 1 tablet (2 mg total) by mouth daily.  Marland Kitchen terbinafine (LAMISIL) 250 MG tablet Take 1 tablet (250 mg total) by mouth daily.  . traMADol (ULTRAM) 50 MG tablet Take 1-2 tablets (50-100 mg total) by mouth every 6 (six) hours as needed.   No facility-administered encounter medications on file as of 07/16/2020.    Allergies as of 07/16/2020 - Review Complete 07/16/2020  Allergen Reaction Noted  . Benadryl [diphenhydramine hcl] Itching 11/06/2012  . Milk-related compounds Diarrhea 09/22/2016  . Oxycodone-acetaminophen Nausea And Vomiting 02/27/2007  . Penicillins Nausea Only 02/27/2007    Past Medical History:  Diagnosis Date  . AC (acromioclavicular) joint bone spurs   . Acid reflux   .  Anxiety   . Arthritis    knees  . CTS (carpal tunnel syndrome)    Bilateral  . Depression   . Gastric ulcer   . GERD (gastroesophageal reflux disease)   . Headache   . Heel spur   . History of chicken pox   . Hypertension   . Morbid obesity (HCC)   . Panic attacks   . Plantar fasciitis   . Sickle cell trait (HCC)   . Sleep apnea    uses sometimes  . Tendonitis    Feet    Past Surgical History:  Procedure Laterality Date  . CARPAL TUNNEL RELEASE Right 10/26/2018   Procedure: RIGHT CARPAL TUNNEL RELEASE;  Surgeon: Kathryne Hitch, MD;  Location: [facility];  Service: Orthopedics;  Laterality: Right;  . CHOLECYSTECTOMY    . COLONOSCOPY WITH PROPOFOL N/A 01/09/2018   Procedure: COLONOSCOPY WITH PROPOFOL;  Surgeon: Corbin Ade, MD;  Location[facility];  Service: Endoscopy;  Laterality: N/A;  8:45am  . TUBAL LIGATION  08/29/04  . WISDOM TOOTH EXTRACTION      Family History  Problem Relation Age of Onset  . Hypertension Father 109       Deceased  . Heart attack Father   . Hypertension Mother        Living  . Arthritis Mother   . Diabetes Mother   . Dementia Mother   . Depression Mother   . Arthritis Sister        x2  . Lung cancer Sister        Deceased  . Cancer Other        Paternal & Maternal Aunts  . Heart attack Maternal Grandmother   . Neuropathy Sister   . Bipolar disorder Sister   . Schizophrenia Sister   . Heart disease Brother        x1  . Alcohol abuse Brother   . Alcoholism Brother        x2  . Colon cancer Neg Hx     Social History   Socioeconomic History  . Marital status: Single    Spouse name: Not on file  . Number of children: 0  . Years of education: Not on file  . Highest education level: Some college, no degree  Occupational History    Comment: not employed   Tobacco Use  . Smoking status: Never Smoker  . Smokeless tobacco: Never Used  Vaping Use  . Vaping Use: Never used  Substance and Sexual Activity  . Alcohol use: No  . Drug use: No  . Sexual activity: Not Currently    Birth control/protection: Post-menopausal    Comment: BTL  Other Topics Concern  . Not on file  Social History Narrative  . Not on file   Social Determinants of Health   Financial Resource Strain:   . Difficulty of Paying Living Expenses: Not on file  Food Insecurity:   . Worried About Programme researcher, broadcasting/film/video in the Last Year: Not on file  . Ran Out of Food in the Last Year: Not on file  Transportation Needs:   . Lack of Transportation (Medical): Not on file   . Lack of Transportation (Non-Medical): Not on file  Physical Activity:   . Days of Exercise per Week: Not on file  . Minutes of Exercise per Session: Not on file  Stress:   . Feeling of Stress : Not on file  Social Connections:   . Frequency of Communication  with Friends and Family: Not on file  . Frequency of Social Gatherings with Friends and Family: Not on file  . Attends Religious Services: Not on file  . Active Member of Clubs or Organizations: Not on file  . Attends Banker Meetings: Not on file  . Marital Status: Not on file  Intimate Partner Violence:   . Fear of Current or Ex-Partner: Not on file  . Emotionally Abused: Not on file  . Physically Abused: Not on file  . Sexually Abused: Not on file    Review of Systems  Constitutional: Positive for fatigue.  HENT: Negative.   Eyes: Negative.   Respiratory: Positive for apnea.   Cardiovascular: Negative.   Gastrointestinal: Negative.   Endocrine: Negative.   Genitourinary: Negative.  Negative for vaginal bleeding.  Psychiatric/Behavioral: Positive for sleep disturbance.  All other systems reviewed and are negative.   Vitals:   07/16/20 1425  BP: 122/80  Pulse: 94  Temp: (!) 97.4 F (36.3 C)  SpO2: 96%     Physical Exam Constitutional:      Appearance: She is well-developed.     Comments: Obese  HENT:     Head: Normocephalic and atraumatic.  Eyes:     Pupils: Pupils are equal, round, and reactive to light.  Neck:     Thyroid: No thyromegaly.     Trachea: No tracheal deviation.  Cardiovascular:     Rate and Rhythm: Normal rate and regular rhythm.     Heart sounds: Normal heart sounds.  Pulmonary:     Effort: Pulmonary effort is normal. No respiratory distress.     Breath sounds: Normal breath sounds. No wheezing.  Musculoskeletal:     Cervical back: No rigidity or tenderness.    Sleep study titration did reveal pressure requirement of 18  Results of the Epworth flowsheet 12/29/2018  09/28/2018  Sitting and reading 0 3  Watching TV 3 3  Sitting, inactive in a public place (e.g. a theatre or a meeting) 0 3  As a passenger in a car for an hour without a break 0 0  Lying down to rest in the afternoon when circumstances permit 0 3  Sitting and talking to someone 0 0  Sitting quietly after a lunch without alcohol 0 0  In a car, while stopped for a few minutes in traffic 0 0  Total score 3 12   Compliance data not available today  Assessment:    Obstructive sleep apnea -Encouraged to continue with very good compliance  Obesity -Continues to work on losing weight  Graded exercises as tolerated   Plan/Recommendations:  Continue CPAP therapy at 15  Follow-up CPAP download  Encouraged to continue weight loss efforts  I will see you back in the office in 3 months    Virl Diamond MD [facility] 07/16/2020, 2:35 PM  CC: Marcine Matar, MD

## 2020-07-17 ENCOUNTER — Other Ambulatory Visit: Payer: Self-pay | Admitting: Internal Medicine

## 2020-07-17 MED FILL — PANTOPRAZOLE SOD DR 40 MG T: 40 | 30 days supply | Qty: 30 | Fill #0

## 2020-07-23 MED FILL — ?LOSARTAN POTASSI 25MG TAB: 25 | 30 days supply | Qty: 30 | Fill #2

## 2020-07-23 MED FILL — AMLODIPINE BESYLATE 10 MG T: 10 | 30 days supply | Qty: 30 | Fill #2

## 2020-07-31 ENCOUNTER — Other Ambulatory Visit: Payer: Self-pay

## 2020-07-31 ENCOUNTER — Ambulatory Visit: Payer: No Typology Code available for payment source | Admitting: Podiatry

## 2020-07-31 DIAGNOSIS — B351 Tinea unguium: Secondary | ICD-10-CM

## 2020-08-01 ENCOUNTER — Encounter: Payer: Self-pay | Admitting: Podiatry

## 2020-08-01 MED FILL — ATORVASTATIN CALCIUM 20 MG: 20 | 30 days supply | Qty: 30 | Fill #2

## 2020-08-01 NOTE — Progress Notes (Signed)
  Subjective:  Patient ID: Amy Perez, female    DOB: Jan 06, 1966,  MRN: [id number]  Chief Complaint  Patient presents with  . Nail Problem    Nail check Pt denies any pain and has no concerns, Stated that she just wants to make sure it is doing right    54 y.o. female presents with the above complaint. History confirmed with patient.  Has been taking terbinafine.  Objective:  Physical Exam: warm, good capillary refill, no trophic changes or ulcerative lesions, normal DP and PT pulses and normal sensory exam. Mild non pitting edema bilateral LE, diffuse. Left Foot: normal exam, no swelling, tenderness, instability; ligaments intact, full range of motion of all ankle/foot joints  Right Foot: Nail regrowing, minimal growth and has some brown discoloration    Assessment:   1. Onychomycosis      Plan:  Patient was evaluated and treated and all questions answered.   -Avulsion site has been healing well.  Continue soaks and can begin to leave OTA at night and while at home barefoot. -Complete terbinafine course -F/U in 3 months  No follow-ups on file.

## 2020-08-04 MED FILL — KETOCONAZOLE 2% CREAM: 2 | 30 days supply | Qty: 60 | Fill #2

## 2020-08-05 ENCOUNTER — Other Ambulatory Visit: Payer: Self-pay | Admitting: Podiatry

## 2020-08-05 ENCOUNTER — Telehealth: Payer: Self-pay | Admitting: *Deleted

## 2020-08-05 MED ORDER — CICLOPIROX 8 % EX SOLN: Freq: Every day | CUTANEOUS | 2 refills | Status: DC

## 2020-08-05 NOTE — Telephone Encounter (Signed)
Patient stated that prescription for toes is not at her pharmacy yet. It is a polish that you can brush on. Please call to confirm when sent.

## 2020-08-06 MED FILL — CICLOPIROX 8% SOLUTION: 8 | 30 days supply | Qty: 7 | Fill #0

## 2020-08-06 NOTE — Telephone Encounter (Signed)
Called patient ,left VM informing her that prescription for nails has been sent to pharmacy of choice.

## 2020-08-07 MED FILL — MELOXICAM 15 MG TABLET: 15 | 30 days supply | Qty: 30 | Fill #3

## 2020-08-08 MED FILL — LAMOTRIGINE 150 MG TABS: 150 | 30 days supply | Qty: 30 | Fill #2

## 2020-08-08 MED FILL — ESCITALOPRAM 10 MG TABLET: 10 | 30 days supply | Qty: 30 | Fill #2

## 2020-08-13 ENCOUNTER — Telehealth: Payer: Self-pay | Admitting: Pulmonary Disease

## 2020-08-13 NOTE — Telephone Encounter (Signed)
Called and spoke to patient, who states that her cpap machine is not registering and says no SIM card.  I have recommended that she contacts [employer], as they will be able to better assist her.  Patient voiced her understanding and had no further questions.  Nothing further needed.

## 2020-08-19 ENCOUNTER — Other Ambulatory Visit: Payer: Self-pay

## 2020-08-19 ENCOUNTER — Other Ambulatory Visit: Payer: Self-pay | Admitting: Internal Medicine

## 2020-08-19 ENCOUNTER — Ambulatory Visit: Payer: Self-pay | Attending: Internal Medicine | Admitting: Internal Medicine

## 2020-08-19 DIAGNOSIS — F32A Depression, unspecified: Secondary | ICD-10-CM

## 2020-08-19 DIAGNOSIS — I10 Essential (primary) hypertension: Secondary | ICD-10-CM

## 2020-08-19 DIAGNOSIS — F419 Anxiety disorder, unspecified: Secondary | ICD-10-CM

## 2020-08-19 DIAGNOSIS — Z2821 Immunization not carried out because of patient refusal: Secondary | ICD-10-CM

## 2020-08-19 DIAGNOSIS — Z5320 Procedure and treatment not carried out because of patient's decision for unspecified reasons: Secondary | ICD-10-CM

## 2020-08-19 DIAGNOSIS — M1711 Unilateral primary osteoarthritis, right knee: Secondary | ICD-10-CM

## 2020-08-19 DIAGNOSIS — R519 Headache, unspecified: Secondary | ICD-10-CM

## 2020-08-19 DIAGNOSIS — E782 Mixed hyperlipidemia: Secondary | ICD-10-CM

## 2020-08-19 MED ORDER — PANTOPRAZOLE SODIUM 40 MG PO TBEC: 40.0000 mg | DELAYED_RELEASE_TABLET | Freq: Every day | ORAL | 4 refills | Status: AC

## 2020-08-19 MED ORDER — LOSARTAN POTASSIUM 25 MG PO TABS
25.0000 mg | ORAL_TABLET | Freq: Every day | ORAL | 6 refills | Status: AC
Start: 2020-08-19 — End: ?

## 2020-08-19 MED ORDER — MELOXICAM 15 MG PO TABS: 15.0000 mg | ORAL_TABLET | Freq: Every day | ORAL | 6 refills | Status: AC

## 2020-08-19 MED ORDER — AMLODIPINE BESYLATE 10 MG PO TABS: 10.0000 mg | ORAL_TABLET | Freq: Every day | ORAL | 6 refills | Status: AC

## 2020-08-19 MED ORDER — ATORVASTATIN CALCIUM 20 MG PO TABS: 20.0000 mg | ORAL_TABLET | Freq: Every day | ORAL | 6 refills | Status: AC

## 2020-08-19 MED FILL — AMLODIPINE BESYLATE 10 MG T: 10 | 30 days supply | Qty: 30 | Fill #0

## 2020-08-19 MED FILL — PANTOPRAZOLE SOD DR 40 MG T: 40 | 30 days supply | Qty: 30 | Fill #0

## 2020-08-19 MED FILL — LOSARTAN POTASSIUM 25 MG TA: 25 | 30 days supply | Qty: 30 | Fill #0

## 2020-08-19 NOTE — Progress Notes (Signed)
Virtual Visit via Telephone Note Due to current restrictions/limitations of in-office visits due to the COVID-19 pandemic, this scheduled clinical appointment was converted to a telehealth visit  I connected with Amy Perez on 08/19/20 at 1:46 p.m by telephone and verified that I am speaking with the correct person using two identifiers.  Location: Patient: home Provider: office   I discussed the limitations, risks, security and privacy concerns of performing an evaluation and management service by telephone and the availability of in person appointments. I also discussed with the patient that there may be a patient responsible charge related to this service. The patient expressed understanding and agreed to proceed.   History of Present Illness: Pt with hx of HTN,HL,GERD, anxiety/depression, bone spurs in heels,OA RT knee,morbidity obesity, OSA, CTS RT s/p release,chronic constipation.  C/o having frontal sinus headache about a few times/wk.  Claritin and Flonase helps.  Also gets HA sometimes when she gets off her CPAP machine but does not last long.  She spoke with her sleep specialist about the headaches from the CPAP.  No associated blurred vision or dizziness.  Lights bother her eyes all the time and no worse when she has HA.  HTN: checks BP 3 x a wk. Reports readings have been good.  Most recent 119/79.  Reports compliance with meds and low salt diet.  No chest pains or shortness of breath.  Anx/dep: PHQ-9 score improved today compared to July.  She is plugged into mental health services with a psychiatrist on consult.  She feels she is doing well on Lexapro.  HL:  Compliant with Lipitor  Requests refill on meloxicam.  She finds it very helpful for her joint pains.  HM: She declines flu shot.  Declines HIV and hepatitis C screening. Outpatient Encounter Medications as of 08/19/2020  Medication Sig  . amLODipine (NORVASC) 10 MG tablet Take 1 tablet (10 mg total)  by mouth daily. Must have office visit for refills  . atorvastatin (LIPITOR) 20 MG tablet Take 1 tablet (20 mg total) by mouth daily.  . ciclopirox (PENLAC) 8 % solution Apply topically at bedtime. Apply over nail and surrounding skin. Apply daily over previous coat. After seven (7) days, may remove with alcohol and continue cycle.  . cyclobenzaprine (FLEXERIL) 5 MG tablet Take 1 tablet (5 mg total) by mouth daily as needed for muscle spasms.  Marland Kitchen escitalopram (LEXAPRO) 10 MG tablet Take 1 tablet (10 mg total) by mouth daily.  . fluticasone (FLONASE) 50 MCG/ACT nasal spray Place 1 spray into both nostrils daily.  . hydrOXYzine (VISTARIL) 25 MG capsule Take 25 mg by mouth 3 (three) times daily as needed.  Marland Kitchen ketoconazole (NIZORAL) 2 % cream Apply 1 application topically daily.  Marland Kitchen lamoTRIgine (LAMICTAL) 100 MG tablet Take 1 tablet (100 mg total) by mouth daily.  Marland Kitchen lamoTRIgine (LAMICTAL) 150 MG tablet Take 150 mg by mouth daily.  Marland Kitchen levocetirizine (XYZAL) 5 MG tablet Take 1 tablet (5 mg total) by mouth every evening.  . linaclotide (LINZESS) 72 MCG capsule Take 1 capsule (72 mcg total) by mouth daily before breakfast. (Patient taking differently: Take 72 mcg by mouth every other day. )  . loratadine (CLARITIN) 10 MG tablet Take 1 tablet (10 mg total) by mouth daily.  Marland Kitchen losartan (COZAAR) 25 MG tablet Take 1 tablet (25 mg total) by mouth daily.  . meloxicam (MOBIC) 15 MG tablet Take 1 tablet (15 mg total) by mouth daily.  . meloxicam (MOBIC) 15 MG tablet Take 1  tablet (15 mg total) by mouth daily.  . Multiple Vitamin (MULTIVITAMIN) tablet Take 1 tablet by mouth daily. Reported on 04/28/2016  . pantoprazole (PROTONIX) 40 MG tablet TAKE 1 TABLET (40 MG TOTAL) BY MOUTH DAILY.  Marland Kitchen Plecanatide (TRULANCE) 3 MG TABS Take 3 mg by mouth daily.  . propranolol (INDERAL) 40 MG tablet Take 40-80 mg by mouth 2 (two) times daily as needed.  . Prucalopride Succinate (MOTEGRITY) 2 MG TABS Take 1 tablet (2 mg total) by mouth  daily.  Marland Kitchen terbinafine (LAMISIL) 250 MG tablet Take 1 tablet (250 mg total) by mouth daily.  . traMADol (ULTRAM) 50 MG tablet Take 1-2 tablets (50-100 mg total) by mouth every 6 (six) hours as needed.   No facility-administered encounter medications on file as of 08/19/2020.      Observations/Objective: Depression screen [facility] 2/9 08/19/2020 05/19/2020 11/27/2019  Decreased Interest - 2 0  Down, Depressed, Hopeless 3 2 2   PHQ - 2 Score 3 4 2   Altered sleeping 3 2 3   Tired, decreased energy 3 2 3   Change in appetite 1 2 0  Feeling bad or failure about yourself  0 2 1  Trouble concentrating 2 2 3   Moving slowly or fidgety/restless 0 2 0  Suicidal thoughts 0 2 0  PHQ-9 Score 12 18 12   Some recent data might be hidden     Assessment and Plan: 1. Essential hypertension At goal.  Continue current medications and low-salt diet. - losartan (COZAAR) 25 MG tablet; Take 1 tablet (25 mg total) by mouth daily.  Dispense: 30 tablet; Refill: 6 - amLODipine (NORVASC) 10 MG tablet; Take 1 tablet (10 mg total) by mouth daily.  Dispense: 30 tablet; Refill: 6 - CBC; Future  2. Sinus headache She will continue the Flonase and Claritin.  3. Primary osteoarthritis of right knee - meloxicam (MOBIC) 15 MG tablet; Take 1 tablet (15 mg total) by mouth daily.  Dispense: 30 tablet; Refill: 6  4. Mixed hyperlipidemia - atorvastatin (LIPITOR) 20 MG tablet; Take 1 tablet (20 mg total) by mouth daily.  Dispense: 30 tablet; Refill: 6 - Lipid panel; Future  5. Anxiety and depression Plugged into mental health services.  6. Influenza vaccination declined This was offered and patient declined.  7. HIV screening declined Patient declined  8. Screening for hepatitis C declined Patient declined   Follow Up Instructions: 4 mths   I discussed the assessment and treatment plan with the patient. The patient was provided an opportunity to ask questions and all were answered. The patient agreed with the plan  and demonstrated an understanding of the instructions.   The patient was advised to call back or seek an in-person evaluation if the symptoms worsen or if the condition fails to improve as anticipated.  I provided 12 minutes of non-face-to-face time during this encounter.   , MD

## 2020-08-21 ENCOUNTER — Other Ambulatory Visit: Payer: No Typology Code available for payment source

## 2020-08-27 MED FILL — AMLODIPINE BESYLATE 10 MG T: 10 | 30 days supply | Qty: 30 | Fill #0

## 2020-08-27 MED FILL — LOSARTAN POTASSIUM 25 MG TA: 25 | 30 days supply | Qty: 30 | Fill #0

## 2020-08-27 MED FILL — PANTOPRAZOLE SOD DR 40 MG T: 40 | 30 days supply | Qty: 30 | Fill #0

## 2020-08-28 ENCOUNTER — Other Ambulatory Visit: Payer: No Typology Code available for payment source

## 2020-09-02 MED FILL — MELOXICAM 15 MG TABLET: 15 | 30 days supply | Qty: 30 | Fill #0

## 2020-09-02 MED FILL — ?ATORVASTATIN 20 MG TABLET: 20 | 30 days supply | Qty: 30 | Fill #2

## 2020-09-03 ENCOUNTER — Ambulatory Visit (INDEPENDENT_AMBULATORY_CARE_PROVIDER_SITE_OTHER): Payer: No Typology Code available for payment source | Admitting: Physician Assistant

## 2020-09-03 ENCOUNTER — Ambulatory Visit (INDEPENDENT_AMBULATORY_CARE_PROVIDER_SITE_OTHER): Payer: Self-pay

## 2020-09-03 ENCOUNTER — Ambulatory Visit: Payer: Self-pay

## 2020-09-03 ENCOUNTER — Encounter: Payer: Self-pay | Admitting: Physician Assistant

## 2020-09-03 DIAGNOSIS — M1712 Unilateral primary osteoarthritis, left knee: Secondary | ICD-10-CM

## 2020-09-03 DIAGNOSIS — M1711 Unilateral primary osteoarthritis, right knee: Secondary | ICD-10-CM

## 2020-09-03 MED ORDER — METHYLPREDNISOLONE ACETATE 40 MG/ML IJ SUSP
40.0000 mg | INTRAMUSCULAR | Status: AC | PRN
  Administered 2020-09-03: 40 mg via INTRA_ARTICULAR

## 2020-09-03 MED ORDER — LIDOCAINE HCL 1 % IJ SOLN
0.5000 mL | INTRAMUSCULAR | Status: AC | PRN
  Administered 2020-09-03: .5 mL

## 2020-09-03 NOTE — Progress Notes (Signed)
Office Visit Note   Patient: Amy Perez           Date of Birth: Aug 13, 1966           MRN: [id number] Visit Date: 09/03/2020              Requested by: Marcine Matar, MD [id number] [place],  [place] [address] PCP: Marcine Matar, MD   Assessment & Plan: Visit Diagnoses:  1. Primary osteoarthritis of left knee   2. Primary osteoarthritis of right knee     Plan: Patient was given a temporary handicap permit form.  Discussed with her right wait at least 3 months between injections in the knees.  Discussed with her again supplemental injections in her knee she is working on getting Medicare once this is approved she can call our office and we could try to get approval for supplemental injections of both knees.  Questions were encouraged and answered at length.  Follow-up as needed  Follow-Up Instructions: Return if symptoms worsen or fail to improve.   Orders:  Orders Placed This Encounter  Procedures  . Large Joint Inj  . XR Knee 1-2 Views Left  . XR Knee 1-2 Views Right   No orders of the defined types were placed in this encounter.     Procedures: Large Joint Inj: bilateral knee on 09/03/2020 5:35 PM Indications: pain Details: 22 G 1.5 in needle, anterolateral approach  Arthrogram: No  Medications (Right): 0.5 mL lidocaine 1 %; 40 mg methylPREDNISolone acetate 40 MG/ML Medications (Left): 0.5 mL lidocaine 1 %; 40 mg methylPREDNISolone acetate 40 MG/ML Outcome: tolerated well, no immediate complications Procedure, treatment alternatives, risks and benefits explained, specific risks discussed. Consent was given by the patient. Immediately prior to procedure a time out was called to verify the correct patient, procedure, equipment, support staff and site/side marked as required. Patient was prepped and draped in the usual sterile fashion.       Clinical Data: No additional findings.   Subjective: Chief Complaint  Patient presents with    . Right Knee - Pain  . Left Knee - Pain    HPI [name] returns today stating that both knees are becoming painful again.  She has had no new injury to either knee.  She takes meloxicam tramadol for pain tramadol is not helping.  She is requesting handicap permit.  States she has significant pain with ambulation.  Non diabetic. Denies any recent vaccines.  Review of Systems No fevers or chills.   Objective: Vital Signs: LMP 12/14/2017 Comment: BTL  Physical Exam Constitutional:      Appearance: She is not ill-appearing or diaphoretic.  Pulmonary:     Effort: Pulmonary effort is normal.  Neurological:     Mental Status: She is alert and oriented to person, place, and time.  Psychiatric:        Mood and Affect: Mood normal.     Ortho Exam Bilateral knees full range of motion.  Tenderness along medial joint line of both knees.  No instability valgus varus stressing of either knee.  No abnormal warmth erythema or effusion of either knee.  Specialty Comments:  No specialty comments available.  Imaging: XR Knee 1-2 Views Left  Result Date: 09/03/2020 Left knee 2 views: No acute fracture.  Knee is well located.  Moderate medial compartmental narrowing.  Severe patellofemoral changes.  Mild arthritic changes with periarticular spurs of the lateral compartment.  XR Knee 1-2 Views Right  Result  Date: 09/03/2020 Right knee AP and lateral views: No acute fracture knee is well located.  Moderate narrowing medial joint line.  Severe patellofemoral arthritic changes.  Periarticular spurs lateral compartment with overall joint line maintained.    PMFS History: Patient Active Problem List   Diagnosis Date Noted  . Mixed hyperlipidemia 11/27/2019  . Abdominal pain 08/21/2019  . Moderate episode of recurrent major depressive disorder (HCC) 11/30/2018  . Carpal tunnel syndrome, right upper limb 10/26/2018  . OSA (obstructive sleep apnea) 07/27/2018  . Hypomagnesemia 03/27/2018   . Constipation 12/08/2017  . Taking multiple medications for chronic disease 12/08/2017  . Primary osteoarthritis of right knee 04/21/2017  . Acute bacterial sinusitis 11/07/2016  . Insomnia 07/02/2016  . Carpal tunnel syndrome 06/29/2016  . Gastroesophageal reflux disease without esophagitis 04/26/2016  . Essential hypertension 04/26/2016  . Generalized anxiety disorder 04/26/2016  . OBESITY, MORBID 03/01/2007   Past Medical History:  Diagnosis Date  . AC (acromioclavicular) joint bone spurs   . Acid reflux   . Anxiety   . Arthritis    knees  . CTS (carpal tunnel syndrome)    Bilateral  . Depression   . Gastric ulcer   . GERD (gastroesophageal reflux disease)   . Headache   . Heel spur   . History of chicken pox   . Hypertension   . Morbid obesity (HCC)   . Panic attacks   . Plantar fasciitis   . Sickle cell trait (HCC)   . Sleep apnea    uses sometimes  . Tendonitis    Feet    Family History  Problem Relation Age of Onset  . Hypertension Father 7       Deceased  . Heart attack Father   . Hypertension Mother        Living  . Arthritis Mother   . Diabetes Mother   . Dementia Mother   . Depression Mother   . Arthritis Sister        x2  . Lung cancer Sister        Deceased  . Cancer Other        Paternal & Maternal Aunts  . Heart attack Maternal Grandmother   . Neuropathy Sister   . Bipolar disorder Sister   . Schizophrenia Sister   . Heart disease Brother        x1  . Alcohol abuse Brother   . Alcoholism Brother        x2  . Colon cancer Neg Hx     Past Surgical History:  Procedure Laterality Date  . CARPAL TUNNEL RELEASE Right 10/26/2018   Procedure: RIGHT CARPAL TUNNEL RELEASE;  Surgeon: Kathryne Hitch, MD;  Location: [facility];  Service: Orthopedics;  Laterality: Right;  . CHOLECYSTECTOMY    . COLONOSCOPY WITH PROPOFOL N/A 01/09/2018   Procedure: COLONOSCOPY WITH PROPOFOL;  Surgeon: Corbin Ade, MD;  [facility];  Service: Endoscopy;  Laterality: N/A;  8:45am  . TUBAL LIGATION  08/29/04  . WISDOM TOOTH EXTRACTION     Social History   Occupational History    Comment: not employed   Tobacco Use  . Smoking status: Never Smoker  . Smokeless tobacco: Never Used  Vaping Use  . Vaping Use: Never used  Substance and Sexual Activity  . Alcohol use: No  . Drug use: No  . Sexual activity: Not Currently    Birth control/protection: Post-menopausal    Comment: BTL

## 2020-09-04 ENCOUNTER — Telehealth: Payer: Self-pay | Admitting: Internal Medicine

## 2020-09-04 NOTE — Telephone Encounter (Signed)
Hey Dr Leone Payor, this pt is requesting to establish care with [place] for IBS and GERD, pt last saw [facility] 12/04/2019, records are in epic for review, please advise on scheduling.

## 2020-09-04 NOTE — Telephone Encounter (Signed)
Pt called to say she is transferring GI care to [facility] since it's closer to her home. I mailed her a release of records to sign and mail back to me.

## 2020-09-08 NOTE — Telephone Encounter (Signed)
OK to schedule She does live in [facility] so if she wants [facility] we can schedule there  Otherwise I can see her

## 2020-09-17 MED FILL — ESCITALOPRAM 10 MG TABLET: 10 | 30 days supply | Qty: 30 | Fill #0

## 2020-09-17 MED FILL — LAMOTRIGINE 150 MG TABS: 150 | 30 days supply | Qty: 30 | Fill #0

## 2020-09-24 ENCOUNTER — Other Ambulatory Visit: Payer: Self-pay | Admitting: Nurse Practitioner

## 2020-09-25 ENCOUNTER — Other Ambulatory Visit: Payer: Self-pay

## 2020-09-25 ENCOUNTER — Encounter: Payer: Self-pay | Admitting: Pulmonary Disease

## 2020-09-25 ENCOUNTER — Ambulatory Visit (INDEPENDENT_AMBULATORY_CARE_PROVIDER_SITE_OTHER): Payer: Medicare Other | Admitting: Pulmonary Disease

## 2020-09-25 VITALS — BP 124/82 | HR 88 | Temp 97.6°F | Ht 66.0 in | Wt 292.0 lb

## 2020-09-25 DIAGNOSIS — Z9989 Dependence on other enabling machines and devices: Secondary | ICD-10-CM | POA: Diagnosis not present

## 2020-09-25 DIAGNOSIS — G4733 Obstructive sleep apnea (adult) (pediatric): Secondary | ICD-10-CM

## 2020-09-25 MED FILL — PROPRANOLOL HCL 40 MG TABS: 40 | 30 days supply | Qty: 60 | Fill #0

## 2020-09-25 MED FILL — HYDROXYZINE PAM 25 MG CAP: 25 | 10 days supply | Qty: 30 | Fill #0

## 2020-09-25 NOTE — Telephone Encounter (Signed)
Spoke with pt, she states she already has a GI provider in [facility], does not wish to schedule at this time.

## 2020-09-25 NOTE — Progress Notes (Signed)
Amy [facility]    [id number]    Sep 11, 1966  Primary Care Physician:Johnson, Binnie Rail, MD  Referring Physician: Marcine Matar, MD [address] [place],  [place] [address]  Chief complaint:   Patient with a history of obstructive sleep apnea She feels relatively well No significant health problems since last visit    HPI:  Compliant with CPAP use Denies any significant problems with tolerating CPAP CPAP continues to help daytime symptoms  Has other social issues ongoing-being addressed  Currently on CPAP of 15 Tolerating it well  Diagnoses obstructive sleep apnea and titrated to a pressure of 18-pressure was reduced to 15 during last visit and is better tolerated She also did have to change our mask  Stable bedtime, sometimes to go to bed at 3 PM Wakes up a few times during the night Wakes up different times in the mornings  Has managed to lose a little bit of weight since the last time she was here History of PTSD, history of depression History of morbid obesity Hypertension   Outpatient Encounter Medications as of 09/25/2020  Medication Sig  . amLODipine (NORVASC) 10 MG tablet Take 1 tablet (10 mg total) by mouth daily.  Marland Kitchen atorvastatin (LIPITOR) 20 MG tablet Take 1 tablet (20 mg total) by mouth daily.  . ciclopirox (PENLAC) 8 % solution Apply topically at bedtime. Apply over nail and surrounding skin. Apply daily over previous coat. After seven (7) days, may remove with alcohol and continue cycle.  . cyclobenzaprine (FLEXERIL) 5 MG tablet Take 1 tablet (5 mg total) by mouth daily as needed for muscle spasms.  Marland Kitchen escitalopram (LEXAPRO) 10 MG tablet Take 1 tablet (10 mg total) by mouth daily.  . fluticasone (FLONASE) 50 MCG/ACT nasal spray Place 1 spray into both nostrils daily.  . hydrOXYzine (VISTARIL) 25 MG capsule Take 25 mg by mouth 3 (three) times daily as needed.  Marland Kitchen ketoconazole (NIZORAL) 2 % cream Apply 1 application topically daily.   Marland Kitchen lamoTRIgine (LAMICTAL) 100 MG tablet Take 1 tablet (100 mg total) by mouth daily.  Marland Kitchen lamoTRIgine (LAMICTAL) 150 MG tablet Take 150 mg by mouth daily.  Marland Kitchen levocetirizine (XYZAL) 5 MG tablet Take 1 tablet (5 mg total) by mouth every evening.  . loratadine (CLARITIN) 10 MG tablet Take 1 tablet (10 mg total) by mouth daily.  Marland Kitchen losartan (COZAAR) 25 MG tablet Take 1 tablet (25 mg total) by mouth daily.  . meloxicam (MOBIC) 15 MG tablet Take 1 tablet (15 mg total) by mouth daily.  . Multiple Vitamin (MULTIVITAMIN) tablet Take 1 tablet by mouth daily. Reported on 04/28/2016  . pantoprazole (PROTONIX) 40 MG tablet Take 1 tablet (40 mg total) by mouth daily.  Marland Kitchen Plecanatide (TRULANCE) 3 MG TABS Take 3 mg by mouth daily.  . propranolol (INDERAL) 40 MG tablet Take 40-80 mg by mouth 2 (two) times daily as needed.  . Prucalopride Succinate (MOTEGRITY) 2 MG TABS Take 1 tablet (2 mg total) by mouth daily.  . traMADol (ULTRAM) 50 MG tablet Take 1-2 tablets (50-100 mg total) by mouth every 6 (six) hours as needed.   No facility-administered encounter medications on file as of 09/25/2020.    Allergies as of 09/25/2020 - Review Complete 09/25/2020  Allergen Reaction Noted  . Benadryl [diphenhydramine hcl] Itching 11/06/2012  . Milk-related compounds Diarrhea 09/22/2016  . Oxycodone-acetaminophen Nausea And Vomiting 02/27/2007  . Penicillins Nausea Only 02/27/2007    Past Medical History:  Diagnosis  Date  . AC (acromioclavicular) joint bone spurs   . Acid reflux   . Anxiety   . Arthritis    knees  . CTS (carpal tunnel syndrome)    Bilateral  . Depression   . Gastric ulcer   . GERD (gastroesophageal reflux disease)   . Headache   . Heel spur   . History of chicken pox   . Hypertension   . Morbid obesity (HCC)   . Panic attacks   . Plantar fasciitis   . Sickle cell trait (HCC)   . Sleep apnea    uses sometimes  . Tendonitis    Feet    Past Surgical History:  Procedure Laterality Date  .  CARPAL TUNNEL RELEASE Right 10/26/2018   Procedure: RIGHT CARPAL TUNNEL RELEASE;  Surgeon: Kathryne Hitch, MD;  Location: [facility];  Service: Orthopedics;  Laterality: Right;  . CHOLECYSTECTOMY    . COLONOSCOPY WITH PROPOFOL N/A 01/09/2018   Procedure: COLONOSCOPY WITH PROPOFOL;  Surgeon: Corbin Ade, MD;  [facility];  Service: Endoscopy;  Laterality: N/A;  8:45am  . TUBAL LIGATION  08/29/04  . WISDOM TOOTH EXTRACTION      Family History  Problem Relation Age of Onset  . Hypertension Father 78       Deceased  . Heart attack Father   . Hypertension Mother        Living  . Arthritis Mother   . Diabetes Mother   . Dementia Mother   . Depression Mother   . Arthritis Sister        x2  . Lung cancer Sister        Deceased  . Cancer Other        Paternal & Maternal Aunts  . Heart attack Maternal Grandmother   . Neuropathy Sister   . Bipolar disorder Sister   . Schizophrenia Sister   . Heart disease Brother        x1  . Alcohol abuse Brother   . Alcoholism Brother        x2  . Colon cancer Neg Hx     Social History   Socioeconomic History  . Marital status: Single    Spouse name: Not on file  . Number of children: 0  . Years of education: Not on file  . Highest education level: Some college, no degree  Occupational History    Comment: not employed   Tobacco Use  . Smoking status: Never Smoker  . Smokeless tobacco: Never Used  Vaping Use  . Vaping Use: Never used  Substance and Sexual Activity  . Alcohol use: No  . Drug use: No  . Sexual activity: Not Currently    Birth control/protection: Post-menopausal    Comment: BTL  Other Topics Concern  . Not on file  Social History Narrative  . Not on file   Social Determinants of Health   Financial Resource Strain:   . Difficulty of Paying Living Expenses: Not on file  Food Insecurity:   . Worried About Programme researcher, broadcasting/film/video in the Last Year: Not on file  . Ran Out of Food in  the Last Year: Not on file  Transportation Needs:   . Lack of Transportation (Medical): Not on file  . Lack of Transportation (Non-Medical): Not on file  Physical Activity:   . Days of Exercise per Week: Not on file  . Minutes of Exercise per Session: Not on file  Stress:   .  Feeling of Stress : Not on file  Social Connections:   . Frequency of Communication with Friends and Family: Not on file  . Frequency of Social Gatherings with Friends and Family: Not on file  . Attends Religious Services: Not on file  . Active Member of Clubs or Organizations: Not on file  . Attends Banker Meetings: Not on file  . Marital Status: Not on file  Intimate Partner Violence:   . Fear of Current or Ex-Partner: Not on file  . Emotionally Abused: Not on file  . Physically Abused: Not on file  . Sexually Abused: Not on file   Review of Systems  Constitutional: Positive for fatigue.  HENT: Negative.   Eyes: Negative.   Respiratory: Positive for apnea.   Cardiovascular: Negative.   Gastrointestinal: Negative.   Endocrine: Negative.   Genitourinary: Negative.  Negative for vaginal bleeding.  Psychiatric/Behavioral: Positive for sleep disturbance.  All other systems reviewed and are negative.  Vitals:   09/25/20 1202  BP: 124/82  Pulse: 88  Temp: 97.6 F (36.4 C)  SpO2: 98%   Physical Exam Constitutional:      Appearance: She is well-developed. She is obese.     Comments: Obese  HENT:     Head: Normocephalic and atraumatic.  Eyes:     Pupils: Pupils are equal, round, and reactive to light.  Neck:     Thyroid: No thyromegaly.     Trachea: No tracheal deviation.  Cardiovascular:     Rate and Rhythm: Normal rate and regular rhythm.     Pulses: Normal pulses.     Heart sounds: Normal heart sounds.  Pulmonary:     Effort: Pulmonary effort is normal. No respiratory distress.     Breath sounds: Normal breath sounds. No stridor. No wheezing or rhonchi.  Musculoskeletal:      Cervical back: No rigidity or tenderness.  Neurological:     Mental Status: She is alert.  Psychiatric:        Mood and Affect: Mood normal.    Sleep study titration did reveal pressure requirement of 18  Results of the Epworth flowsheet 12/29/2018 09/28/2018  Sitting and reading 0 3  Watching TV 3 3  Sitting, inactive in a public place (e.g. a theatre or a meeting) 0 3  As a passenger in a car for an hour without a break 0 0  Lying down to rest in the afternoon when circumstances permit 0 3  Sitting and talking to someone 0 0  Sitting quietly after a lunch without alcohol 0 0  In a car, while stopped for a few minutes in traffic 0 0  Total score 3 12   Compliance data reviewed showing 88% compliance Machine set at 15 Residual AHI 1.9 Appears to have significant leaks on occasions   Assessment:    Obstructive sleep apnea -Encouraged to continue CPAP use  Obesity -Continues to work on losing weight -Continue graded exercise as tolerated   Plan/Recommendations:  Continue CPAP therapy at 15  We will continue to follow CPAP downloads  Encouraged to continue weight loss efforts  I will see patient back in 6 months   Virl Diamond MD [facility] 09/25/2020, 12:12 PM  CC: Marcine Matar, MD

## 2020-09-25 NOTE — Patient Instructions (Signed)
Sleep apnea appears well treated  I will see you back in 6 months  DME referral for CPAP supplies  Call with significant concerns

## 2020-09-29 MED FILL — AMLODIPINE BESYLATE 10 MG T: 10 | 30 days supply | Qty: 30 | Fill #1

## 2020-09-29 MED FILL — PANTOPRAZOLE SOD DR 40 MG T: 40 | 30 days supply | Qty: 30 | Fill #1

## 2020-09-29 MED FILL — LOSARTAN POTASSIUM 25 MG TA: 25 | 30 days supply | Qty: 30 | Fill #1

## 2020-10-02 ENCOUNTER — Telehealth: Payer: Self-pay | Admitting: Pulmonary Disease

## 2020-10-02 DIAGNOSIS — G4733 Obstructive sleep apnea (adult) (pediatric): Secondary | ICD-10-CM

## 2020-10-02 NOTE — Telephone Encounter (Signed)
Spoke with the pt  She is using a CPAP machine that was donated to her  She states sometimes it does not work properly and wants a new machine  She now has insurance, can we send order to [facility] for her CPAP? Thanks!

## 2020-10-02 NOTE — Telephone Encounter (Signed)
Yes.  Send in a prescription for auto CPAP 5-20, with heated humidification

## 2020-10-02 NOTE — Telephone Encounter (Signed)
New CPAP was ordered I spoke with the pt and notified her order sent  I advised may take several wks to receive regardless of DME due to back order

## 2020-10-13 MED FILL — LAMOTRIGINE 150 MG TABS: 150 | 30 days supply | Qty: 30 | Fill #1

## 2020-10-13 MED FILL — ESCITALOPRAM 10 MG TABLET: 10 | 30 days supply | Qty: 30 | Fill #1

## 2020-10-13 MED FILL — ?ATORVASTATIN 20 MG TABLET: 20 | 30 days supply | Qty: 30 | Fill #0

## 2020-10-13 MED FILL — MELOXICAM 15 MG TABLET: 15 | 30 days supply | Qty: 30 | Fill #0

## 2020-10-27 MED FILL — AMLODIPINE BESYLATE 10 MG T: 10 | 30 days supply | Qty: 30 | Fill #2

## 2020-10-27 MED FILL — LOSARTAN POTASSIUM 25 MG TA: 25 | 30 days supply | Qty: 30 | Fill #2

## 2020-10-27 MED FILL — PANTOPRAZOLE SOD DR 40 MG T: 40 | 30 days supply | Qty: 30 | Fill #2

## 2020-11-13 MED FILL — ESCITALOPRAM 10 MG TABLET: 10 | 30 days supply | Qty: 30 | Fill #2

## 2020-11-13 MED FILL — ?ATORVASTATIN 20 MG TABLET: 20 | 30 days supply | Qty: 30 | Fill #1

## 2020-11-13 MED FILL — LAMOTRIGINE 150 MG TABS: 150 | 30 days supply | Qty: 30 | Fill #2

## 2020-11-20 ENCOUNTER — Ambulatory Visit: Payer: No Typology Code available for payment source | Admitting: Podiatry

## 2020-11-25 MED FILL — LOSARTAN POTASSIUM 25 MG TA: 25 | 30 days supply | Qty: 30 | Fill #3

## 2020-11-25 MED FILL — AMLODIPINE BESYLATE 10 MG T: 10 | 30 days supply | Qty: 30 | Fill #3

## 2020-11-25 MED FILL — PANTOPRAZOLE SOD DR 40 MG T: 40 | 30 days supply | Qty: 30 | Fill #3

## 2020-12-08 ENCOUNTER — Other Ambulatory Visit: Payer: Self-pay | Admitting: Nurse Practitioner

## 2020-12-08 MED FILL — ESCITALOPRAM 10 MG TABLET: 10 | 30 days supply | Qty: 30 | Fill #0

## 2020-12-08 MED FILL — lamoTRIgine 150 MG TABS: 150 | 30 days supply | Qty: 30 | Fill #0

## 2020-12-08 MED FILL — ?ATORVASTATIN 20 MG TABLET: 20 | 30 days supply | Qty: 30 | Fill #2

## 2020-12-15 MED FILL — MELOXICAM 15 MG TABLET: 15 | 30 days supply | Qty: 30 | Fill #1

## 2020-12-17 ENCOUNTER — Other Ambulatory Visit: Payer: Self-pay | Admitting: Nurse Practitioner

## 2020-12-22 ENCOUNTER — Ambulatory Visit: Payer: No Typology Code available for payment source | Admitting: Internal Medicine

## 2020-12-23 MED FILL — AMLODIPINE BESYLATE 10 MG T: 10 | 30 days supply | Qty: 30 | Fill #4

## 2020-12-23 MED FILL — PANTOPRAZOLE SOD DR 40 MG T: 40 | 30 days supply | Qty: 30 | Fill #4

## 2020-12-23 MED FILL — LOSARTAN POTASSIUM 25 MG TA: 25 | 30 days supply | Qty: 30 | Fill #4

## 2020-12-24 ENCOUNTER — Encounter: Payer: Self-pay | Admitting: Internal Medicine

## 2020-12-24 NOTE — Progress Notes (Signed)
I received note from Dr. Gwendalyn Ege with [facility].  Patient had EGD done 10/06/2020.  This revealed scattered skis ring which was dilated, small hiatal hernia, food residual in the entire examined stomach, gastritis.  Allergy negative for celiac disease and H. pylori.  Did show mild chronic inflammation in the stomach.

## 2021-01-07 MED FILL — ?ATORVASTATIN 20 MG TABLET: 20 | 30 days supply | Qty: 30 | Fill #3

## 2021-01-14 MED FILL — ATORVASTATIN CALCIUM 20 MG: 20 | 30 days supply | Qty: 30 | Fill #3

## 2021-01-28 ENCOUNTER — Other Ambulatory Visit: Payer: Self-pay

## 2021-01-28 MED FILL — Meloxicam Tab 15 MG: ORAL | 30 days supply | Qty: 30 | Fill #0 | Status: AC

## 2021-01-28 MED FILL — Amlodipine Besylate Tab 10 MG (Base Equivalent): ORAL | 30 days supply | Qty: 30 | Fill #0 | Status: AC

## 2021-01-28 MED FILL — Losartan Potassium Tab 25 MG: ORAL | 30 days supply | Qty: 30 | Fill #0 | Status: AC

## 2021-01-30 ENCOUNTER — Other Ambulatory Visit: Payer: Self-pay

## 2021-02-11 ENCOUNTER — Other Ambulatory Visit: Payer: Self-pay

## 2021-02-11 MED FILL — Atorvastatin Calcium Tab 20 MG (Base Equivalent): ORAL | 30 days supply | Qty: 30 | Fill #0 | Status: AC

## 2021-02-11 MED FILL — Lamotrigine Tab 150 MG: ORAL | 30 days supply | Qty: 30 | Fill #0 | Status: AC

## 2021-02-12 ENCOUNTER — Other Ambulatory Visit: Payer: Self-pay

## 2021-02-17 ENCOUNTER — Other Ambulatory Visit: Payer: Self-pay

## 2021-02-23 MED FILL — Escitalopram Oxalate Tab 10 MG (Base Equiv): ORAL | 30 days supply | Qty: 30 | Fill #0 | Status: AC

## 2021-02-24 ENCOUNTER — Other Ambulatory Visit: Payer: Self-pay

## 2021-03-02 ENCOUNTER — Other Ambulatory Visit: Payer: Self-pay

## 2021-03-02 MED FILL — Meloxicam Tab 15 MG: ORAL | 30 days supply | Qty: 30 | Fill #1 | Status: AC

## 2021-03-02 MED FILL — Amlodipine Besylate Tab 10 MG (Base Equivalent): ORAL | 30 days supply | Qty: 30 | Fill #1 | Status: AC

## 2021-03-02 MED FILL — Losartan Potassium Tab 25 MG: ORAL | 30 days supply | Qty: 30 | Fill #1 | Status: AC

## 2021-03-03 ENCOUNTER — Other Ambulatory Visit: Payer: Self-pay

## 2021-03-11 ENCOUNTER — Other Ambulatory Visit: Payer: Self-pay

## 2021-03-11 MED ORDER — ESCITALOPRAM OXALATE 10 MG PO TABS
1.0000 | ORAL_TABLET | Freq: Every day | ORAL | 2 refills | Status: AC
  Filled 2021-03-11 – 2021-04-23 (×2): qty 30, 30d supply, fill #0
  Filled 2021-05-24: qty 30, 30d supply, fill #1
  Filled 2021-06-22 – 2021-06-30 (×2): qty 30, 30d supply, fill #2

## 2021-03-11 MED ORDER — LAMOTRIGINE 150 MG PO TABS
150.0000 mg | ORAL_TABLET | Freq: Every day | ORAL | 2 refills | Status: DC
Start: 2021-03-11 — End: 2022-05-12
  Filled 2021-03-11: qty 30, 30d supply, fill #0
  Filled 2021-04-20: qty 30, 30d supply, fill #1
  Filled 2021-05-24: qty 30, 30d supply, fill #2

## 2021-03-12 ENCOUNTER — Other Ambulatory Visit: Payer: Self-pay

## 2021-03-13 ENCOUNTER — Other Ambulatory Visit: Payer: Self-pay

## 2021-03-13 ENCOUNTER — Other Ambulatory Visit: Payer: Self-pay | Admitting: Internal Medicine

## 2021-03-13 MED ORDER — PANTOPRAZOLE SODIUM 40 MG PO TBEC
DELAYED_RELEASE_TABLET | Freq: Every day | ORAL | 4 refills | Status: DC
  Filled 2021-03-13: qty 30, 30d supply, fill #0
  Filled 2021-04-20: qty 30, 30d supply, fill #1
  Filled 2021-05-24: qty 30, 30d supply, fill #2
  Filled 2021-06-22 – 2021-06-30 (×2): qty 30, 30d supply, fill #3
  Filled 2021-08-02: qty 30, 30d supply, fill #4

## 2021-03-13 MED FILL — Lamotrigine Tab 150 MG: ORAL | 30 days supply | Qty: 30 | Fill #1 | Status: CN

## 2021-03-13 MED FILL — Atorvastatin Calcium Tab 20 MG (Base Equivalent): ORAL | 30 days supply | Qty: 30 | Fill #1 | Status: AC

## 2021-03-13 NOTE — Telephone Encounter (Signed)
Requested Prescriptions  Pending Prescriptions Disp Refills  . pantoprazole (PROTONIX) 40 MG tablet 30 tablet 4    Sig: TAKE 1 TABLET (40 MG TOTAL) BY MOUTH DAILY.     Gastroenterology: Proton Pump Inhibitors Passed - 03/13/2021  2:17 PM      Passed - Valid encounter within last 12 months    Recent Outpatient Visits          6 months ago Essential hypertension   [facility] Marcine Matar, MD   8 months ago Viral upper respiratory illness   [facility] Marcine Matar, MD   9 months ago Essential hypertension   [facility] [place], [place], NP   11 months ago No-show for appointment   [facility] Zonia Kief, [place], NP   1 year ago No-show for appointment   [facility] Rema Fendt, NP      Future Appointments            In 3 days Kathryne Hitch, MD [facility]

## 2021-03-16 ENCOUNTER — Other Ambulatory Visit: Payer: Self-pay

## 2021-03-16 ENCOUNTER — Ambulatory Visit: Payer: Medicare Other | Admitting: Orthopaedic Surgery

## 2021-03-17 ENCOUNTER — Other Ambulatory Visit: Payer: Self-pay

## 2021-03-24 MED FILL — Escitalopram Oxalate Tab 10 MG (Base Equiv): ORAL | 30 days supply | Qty: 30 | Fill #1 | Status: AC

## 2021-03-25 ENCOUNTER — Ambulatory Visit (INDEPENDENT_AMBULATORY_CARE_PROVIDER_SITE_OTHER): Payer: Medicare HMO | Admitting: Orthopaedic Surgery

## 2021-03-25 ENCOUNTER — Encounter: Payer: Self-pay | Admitting: Orthopaedic Surgery

## 2021-03-25 ENCOUNTER — Other Ambulatory Visit: Payer: Self-pay

## 2021-03-25 VITALS — Ht 66.0 in | Wt 292.0 lb

## 2021-03-25 DIAGNOSIS — M170 Bilateral primary osteoarthritis of knee: Secondary | ICD-10-CM | POA: Diagnosis not present

## 2021-03-25 DIAGNOSIS — M1712 Unilateral primary osteoarthritis, left knee: Secondary | ICD-10-CM

## 2021-03-25 DIAGNOSIS — M1711 Unilateral primary osteoarthritis, right knee: Secondary | ICD-10-CM | POA: Diagnosis not present

## 2021-03-25 MED ORDER — CELECOXIB 200 MG PO CAPS: 200.0000 mg | ORAL_CAPSULE | Freq: Two times a day (BID) | ORAL | 3 refills | Status: AC | PRN

## 2021-03-25 NOTE — Progress Notes (Signed)
The patient is well-known to Amy [facility].  She is 55 years old and has known bilateral knee osteoarthritis.  She has had injections in the past.  Her knees have been hurting her as well as her hamstrings on both sides.  Her last BMI was 47.  Injections have helped in the past but she does not really want them today.  She has been on Mobic for pain and inflammation but would like to try a different anti-inflammatory.  She is otherwise had no other acute change in her medical status.  Both knees show medial joint line tenderness and patellofemoral crepitation with pain.  Her hamstring is tender on the right side today but not the left side.  Her hips move normally.  I told her about the possibility of knee replacement surgery once she has a BMI that is much lower.  She understands this fully.  She actually inquired about the possibility of referral to a bariatric surgery practice to consider weight loss type of surgery.  We can certainly make that referral and send her to Dr. Gaynelle Adu of [facility] for evaluation and treatment.  I will start her on Celebrex as an anti-inflammatory.  I have continue to encourage weight loss and quad strengthening exercises.

## 2021-03-30 ENCOUNTER — Other Ambulatory Visit: Payer: Self-pay

## 2021-03-31 ENCOUNTER — Other Ambulatory Visit: Payer: Self-pay

## 2021-04-20 ENCOUNTER — Other Ambulatory Visit: Payer: Self-pay

## 2021-04-23 ENCOUNTER — Other Ambulatory Visit: Payer: Self-pay

## 2021-04-23 MED FILL — Meloxicam Tab 15 MG: ORAL | 30 days supply | Qty: 30 | Fill #2 | Status: AC

## 2021-04-24 ENCOUNTER — Other Ambulatory Visit: Payer: Self-pay

## 2021-04-29 ENCOUNTER — Other Ambulatory Visit: Payer: Self-pay

## 2021-04-29 MED ORDER — LAMOTRIGINE 150 MG PO TABS
150.0000 mg | ORAL_TABLET | Freq: Every day | ORAL | 2 refills | Status: DC
  Filled 2021-04-29: qty 30, 30d supply, fill #0

## 2021-04-29 MED ORDER — ESCITALOPRAM OXALATE 10 MG PO TABS
10.0000 mg | ORAL_TABLET | Freq: Every day | ORAL | 2 refills | Status: DC
  Filled 2021-04-29: qty 30, 30d supply, fill #0

## 2021-05-24 MED FILL — Meloxicam Tab 15 MG: ORAL | 30 days supply | Qty: 30 | Fill #3 | Status: AC

## 2021-05-25 ENCOUNTER — Other Ambulatory Visit: Payer: Self-pay

## 2021-05-26 ENCOUNTER — Telehealth: Payer: Self-pay | Admitting: Pulmonary Disease

## 2021-05-26 NOTE — Telephone Encounter (Signed)
[facility] patient to inform to being CPAP machine with SD card so we can get compliance. Left VM to bring to OV tomorrow with Dr. Wynona Neat. Nothing further needed.

## 2021-05-27 ENCOUNTER — Other Ambulatory Visit: Payer: Self-pay

## 2021-05-27 ENCOUNTER — Encounter: Payer: Self-pay | Admitting: Pulmonary Disease

## 2021-05-27 ENCOUNTER — Ambulatory Visit (INDEPENDENT_AMBULATORY_CARE_PROVIDER_SITE_OTHER): Payer: Medicare HMO | Admitting: Pulmonary Disease

## 2021-05-27 VITALS — BP 118/74 | HR 83 | Temp 98.1°F | Ht 66.0 in | Wt 272.2 lb

## 2021-05-27 DIAGNOSIS — Z9989 Dependence on other enabling machines and devices: Secondary | ICD-10-CM

## 2021-05-27 DIAGNOSIS — G4733 Obstructive sleep apnea (adult) (pediatric): Secondary | ICD-10-CM | POA: Diagnosis not present

## 2021-05-27 MED ORDER — LEVOCETIRIZINE DIHYDROCHLORIDE 5 MG PO TABS
5.0000 mg | ORAL_TABLET | Freq: Every evening | ORAL | 5 refills | Status: DC
  Filled 2021-05-27: qty 31, 31d supply, fill #0

## 2021-05-27 MED ORDER — LEVOCETIRIZINE DIHYDROCHLORIDE 5 MG PO TABS: 5.0000 mg | ORAL_TABLET | Freq: Every evening | ORAL | 5 refills | Status: AC

## 2021-05-27 NOTE — Progress Notes (Signed)
Amy Perez [facility]    [id number]    04/25/1966  Primary Care Physician:Johnson, Binnie Rail, MD  Referring Physician: Marcine Matar, MD [address] [place],  [place] [address]  Chief complaint:   Patient with a history of obstructive sleep apnea She feels relatively well Stable since her last visit    HPI:  Compliant with CPAP use CPAP continues to help daytime sleepiness No issues tolerating CPAP  Does have social anxiety On medications  Currently on CPAP of 15-tolerating it without difficulty  Diagnoses obstructive sleep apnea and titrated to a pressure of 18-pressure was reduced to 15 during previous visits Having some issues with her mask and she will require CPAP supplies  Stable bedtime, sometimes to go to bed at 3 PM Wakes up a few times during the night Wakes up different times in the mornings  Has managed to lose a little bit of weight since the last time she was here History of PTSD, history of depression History of morbid obesity Hypertension   Outpatient Encounter Medications as of 05/27/2021  Medication Sig   amLODipine (NORVASC) 10 MG tablet Take 1 tablet (10 mg total) by mouth daily.   amLODipine (NORVASC) 10 MG tablet TAKE 1 TABLET (10 MG TOTAL) BY MOUTH DAILY.   atorvastatin (LIPITOR) 20 MG tablet Take 1 tablet (20 mg total) by mouth daily.   atorvastatin (LIPITOR) 20 MG tablet TAKE 1 TABLET (20 MG TOTAL) BY MOUTH DAILY.   celecoxib (CELEBREX) 200 MG capsule Take 1 capsule (200 mg total) by mouth 2 (two) times daily as needed.   ciclopirox (PENLAC) 8 % solution Apply topically at bedtime. Apply over nail and surrounding skin. Apply daily over previous coat. After seven (7) days, may remove with alcohol and continue cycle.   ciclopirox (PENLAC) 8 % solution APPLY TOPICALLY AT BEDTIME. APPLY OVER NAIL AND SURROUNDING SKIN. APPLY DAILY OVER PREVIOUS COAT. AFTER SEVEN (7) DAYS, MAY REMOVE WITH ALCOHOL AND C<MORE>   cyclobenzaprine  (FLEXERIL) 5 MG tablet Take 1 tablet (5 mg total) by mouth daily as needed for muscle spasms.   escitalopram (LEXAPRO) 10 MG tablet TAKE 1 TABLET BY MOUTH ONCE A DAY   escitalopram (LEXAPRO) 10 MG tablet TAKE 1 TABLET BY MOUTH ONCE A DAY   escitalopram (LEXAPRO) 10 MG tablet TAKE 1 TABLET BY MOUTH ONCE A DAY   escitalopram (LEXAPRO) 10 MG tablet TAKE 1 TABLET BY MOUTH ONCE DAILY.   escitalopram (LEXAPRO) 10 MG tablet TAKE 1 TABLET BY MOUTH ONCE A DAY   fluticasone (FLONASE) 50 MCG/ACT nasal spray Place 1 spray into both nostrils daily.   hydrOXYzine (VISTARIL) 25 MG capsule Take 25 mg by mouth 3 (three) times daily as needed.   hydrOXYzine (VISTARIL) 25 MG capsule TAKE 1 CAPSULE BY MOUTH THREE TIMES A DAY AS NEEDED FOR ANXIETY   ketoconazole (NIZORAL) 2 % cream Apply 1 application topically daily.   lamoTRIgine (LAMICTAL) 100 MG tablet Take 1 tablet (100 mg total) by mouth daily.   lamoTRIgine (LAMICTAL) 150 MG tablet Take 150 mg by mouth daily.   lamoTRIgine (LAMICTAL) 150 MG tablet TAKE 1 TABLET BY MOUTH ONCE A DAY   lamoTRIgine (LAMICTAL) 150 MG tablet TAKE 1 TABLET BY MOUTH ONCE A DAY   lamoTRIgine (LAMICTAL) 150 MG tablet TAKE 1 TABLET BY MOUTH ONCE A DAY   lamoTRIgine (LAMICTAL) 150 MG tablet TAKE 1 TABLET BY MOUTH ONCE DAILY.   lamoTRIgine (LAMICTAL) 150 MG tablet TAKE  1 TABLET BY MOUTH ONCE A DAY   levocetirizine (XYZAL) 5 MG tablet Take 1 tablet (5 mg total) by mouth every evening.   loratadine (CLARITIN) 10 MG tablet Take 1 tablet (10 mg total) by mouth daily.   losartan (COZAAR) 25 MG tablet Take 1 tablet (25 mg total) by mouth daily.   losartan (COZAAR) 25 MG tablet TAKE 1 TABLET (25 MG TOTAL) BY MOUTH DAILY.   meloxicam (MOBIC) 15 MG tablet Take 1 tablet (15 mg total) by mouth daily.   meloxicam (MOBIC) 15 MG tablet TAKE 1 TABLET (15 MG TOTAL) BY MOUTH DAILY.   Multiple Vitamin (MULTIVITAMIN) tablet Take 1 tablet by mouth daily. Reported on 04/28/2016   pantoprazole (PROTONIX) 40  MG tablet Take 1 tablet (40 mg total) by mouth daily.   pantoprazole (PROTONIX) 40 MG tablet TAKE 1 TABLET (40 MG TOTAL) BY MOUTH DAILY.   Plecanatide (TRULANCE) 3 MG TABS Take 3 mg by mouth daily.   propranolol (INDERAL) 40 MG tablet Take 40-80 mg by mouth 2 (two) times daily as needed.   propranolol (INDERAL) 40 MG tablet TAKE 1 TABLET BY MOUTH TWICE A DAY AS NEEDED   Prucalopride Succinate (MOTEGRITY) 2 MG TABS Take 1 tablet (2 mg total) by mouth daily.   traMADol (ULTRAM) 50 MG tablet Take 1-2 tablets (50-100 mg total) by mouth every 6 (six) hours as needed.   atorvastatin (LIPITOR) 20 MG tablet TAKE 1 TABLET (20 MG TOTAL) BY MOUTH DAILY.   atorvastatin (LIPITOR) 20 MG tablet TAKE 1 TABLET (20 MG TOTAL) BY MOUTH DAILY.   escitalopram (LEXAPRO) 10 MG tablet Take 1 tablet (10 mg total) by mouth daily.   No facility-administered encounter medications on file as of 05/27/2021.    Allergies as of 05/27/2021 - Review Complete 05/27/2021  Allergen Reaction Noted   Benadryl [diphenhydramine hcl] Itching 11/06/2012   Milk-related compounds Diarrhea 09/22/2016   Oxycodone-acetaminophen Nausea And Vomiting 02/27/2007   Penicillins Nausea Only 02/27/2007    Past Medical History:  Diagnosis Date   AC (acromioclavicular) joint bone spurs    Acid reflux    Anxiety    Arthritis    knees   CTS (carpal tunnel syndrome)    Bilateral   Depression    Gastric ulcer    GERD (gastroesophageal reflux disease)    Headache    Heel spur    History of chicken pox    Hypertension    Morbid obesity (HCC)    Panic attacks    Plantar fasciitis    Sickle cell trait (HCC)    Sleep apnea    uses sometimes   Tendonitis    Feet    Past Surgical History:  Procedure Laterality Date   CARPAL TUNNEL RELEASE Right 10/26/2018   Procedure: RIGHT CARPAL TUNNEL RELEASE;  Surgeon: Kathryne Hitch, MD;  Location: [facility];  Service: Orthopedics;  Laterality: Right;   CHOLECYSTECTOMY      COLONOSCOPY WITH PROPOFOL N/A 01/09/2018   Procedure: COLONOSCOPY WITH PROPOFOL;  Surgeon: Corbin Ade, MD;  [facility];  Service: Endoscopy;  Laterality: N/A;  8:45am   TUBAL LIGATION  08/29/04   WISDOM TOOTH EXTRACTION      Family History  Problem Relation Age of Onset   Hypertension Father 51       Deceased   Heart attack Father    Hypertension Mother        Living   Arthritis Mother    Diabetes Mother  Dementia Mother    Depression Mother    Arthritis Sister        x2   Lung cancer Sister        Deceased   Cancer Other        Paternal & Maternal Aunts   Heart attack Maternal Grandmother    Neuropathy Sister    Bipolar disorder Sister    Schizophrenia Sister    Heart disease Brother        x1   Alcohol abuse Brother    Alcoholism Brother        x2   Colon cancer Neg Hx     Social History   Socioeconomic History   Marital status: Single    Spouse name: Not on file   Number of children: 0   Years of education: Not on file   Highest education level: Some college, no degree  Occupational History    Comment: not employed   Tobacco Use   Smoking status: Never   Smokeless tobacco: Never  Vaping Use   Vaping Use: Never used  Substance and Sexual Activity   Alcohol use: No   Drug use: No   Sexual activity: Not Currently    Birth control/protection: Post-menopausal    Comment: BTL  Other Topics Concern   Not on file  Social History Narrative   Not on file   Social Determinants of Health   Financial Resource Strain: Not on file  Food Insecurity: Not on file  Transportation Needs: Not on file  Physical Activity: Not on file  Stress: Not on file  Social Connections: Not on file  Intimate Partner Violence: Not on file   Review of Systems  Constitutional:  Positive for fatigue.  HENT: Negative.    Eyes: Negative.   Respiratory:  Positive for apnea.   Cardiovascular: Negative.   Gastrointestinal: Negative.   Endocrine: Negative.    Genitourinary: Negative.  Negative for vaginal bleeding.  Psychiatric/Behavioral:  Positive for sleep disturbance.   All other systems reviewed and are negative. Vitals:   05/27/21 1352  BP: 118/74  Pulse: 83  Temp: 98.1 F (36.7 C)  SpO2: 98%   Physical Exam Constitutional:      Appearance: She is well-developed. She is obese.     Comments: Obese  HENT:     Head: Normocephalic and atraumatic.     Mouth/Throat:     Mouth: Mucous membranes are moist.  Eyes:     Pupils: Pupils are equal, round, and reactive to light.  Neck:     Thyroid: No thyromegaly.     Trachea: No tracheal deviation.  Cardiovascular:     Rate and Rhythm: Normal rate and regular rhythm.     Pulses: Normal pulses.     Heart sounds: Normal heart sounds.  Pulmonary:     Effort: Pulmonary effort is normal. No respiratory distress.     Breath sounds: Normal breath sounds. No stridor. No wheezing or rhonchi.  Musculoskeletal:     Cervical back: No rigidity or tenderness.  Neurological:     Mental Status: She is alert.  Psychiatric:        Mood and Affect: Mood normal.   Sleep study titration did reveal pressure requirement of 18  Results of the Epworth flowsheet 12/29/2018 09/28/2018  Sitting and reading 0 3  Watching TV 3 3  Sitting, inactive in a public place (e.g. a theatre or a meeting) 0 3  As a passenger in a car for an hour  without a break 0 0  Lying down to rest in the afternoon when circumstances permit 0 3  Sitting and talking to someone 0 0  Sitting quietly after a lunch without alcohol 0 0  In a car, while stopped for a few minutes in traffic 0 0  Total score 3 12   Is taking at home and reviewed previous compliance -Could not pull current compliance off of card  Assessment:    Obstructive sleep apnea -Encouraged to continue CPAP use on a nightly basis  Obesity -Weight loss efforts encouraged  Environmental allergies -Prescription for Xyzal  provided   Plan/Recommendations:  Continue CPAP -No changes need made at present  Encouraged increasing physical activity as tolerated  Encouraged weight loss efforts  Follow-up in 6 months   Virl Diamond MD [facility] 05/27/2021, 1:57 PM  CC: Marcine Matar, MD

## 2021-05-27 NOTE — Patient Instructions (Signed)
DME referral for CPAP supplies   Prescription for xyzal  I will see you back in 6 months  Continue using your CPAP on a regular basis  Call with concerns

## 2021-06-03 ENCOUNTER — Other Ambulatory Visit: Payer: Self-pay

## 2021-06-03 MED ORDER — LAMOTRIGINE 150 MG PO TABS
150.0000 mg | ORAL_TABLET | Freq: Every day | ORAL | 2 refills | Status: DC
  Filled 2021-06-03 – 2021-06-30 (×2): qty 30, 30d supply, fill #0
  Filled 2021-08-01: qty 30, 30d supply, fill #1

## 2021-06-03 MED ORDER — ESCITALOPRAM OXALATE 10 MG PO TABS
10.0000 mg | ORAL_TABLET | Freq: Every day | ORAL | 2 refills | Status: DC
  Filled 2021-06-03 – 2021-08-06 (×2): qty 30, 30d supply, fill #0

## 2021-06-22 ENCOUNTER — Other Ambulatory Visit: Payer: Self-pay

## 2021-06-22 MED FILL — Meloxicam Tab 15 MG: ORAL | 30 days supply | Qty: 30 | Fill #4 | Status: CN

## 2021-06-23 ENCOUNTER — Other Ambulatory Visit: Payer: Self-pay

## 2021-06-24 ENCOUNTER — Other Ambulatory Visit: Payer: Self-pay

## 2021-06-30 ENCOUNTER — Other Ambulatory Visit: Payer: Self-pay

## 2021-06-30 MED FILL — Meloxicam Tab 15 MG: ORAL | 30 days supply | Qty: 30 | Fill #4 | Status: AC

## 2021-07-01 ENCOUNTER — Other Ambulatory Visit: Payer: Self-pay

## 2021-08-01 ENCOUNTER — Other Ambulatory Visit: Payer: Self-pay

## 2021-08-03 ENCOUNTER — Other Ambulatory Visit: Payer: Self-pay

## 2021-08-04 ENCOUNTER — Other Ambulatory Visit: Payer: Self-pay

## 2021-08-05 ENCOUNTER — Other Ambulatory Visit: Payer: Self-pay

## 2021-08-06 ENCOUNTER — Other Ambulatory Visit: Payer: Self-pay

## 2021-08-07 ENCOUNTER — Other Ambulatory Visit: Payer: Self-pay

## 2021-08-12 ENCOUNTER — Other Ambulatory Visit: Payer: Self-pay

## 2021-09-02 ENCOUNTER — Other Ambulatory Visit: Payer: Self-pay

## 2021-09-02 MED ORDER — ESCITALOPRAM OXALATE 10 MG PO TABS
10.0000 mg | ORAL_TABLET | Freq: Every day | ORAL | 2 refills | Status: DC
  Filled 2021-09-02 – 2021-09-11 (×2): qty 30, 30d supply, fill #0
  Filled 2021-10-07: qty 30, 30d supply, fill #1
  Filled 2021-11-17: qty 30, 30d supply, fill #2
  Filled 2021-11-17: qty 30, 30d supply, fill #0

## 2021-09-02 MED ORDER — LAMOTRIGINE 150 MG PO TABS
150.0000 mg | ORAL_TABLET | Freq: Every day | ORAL | 2 refills | Status: DC
  Filled 2021-09-02 – 2021-09-11 (×2): qty 30, 30d supply, fill #0
  Filled 2021-10-07: qty 30, 30d supply, fill #1
  Filled 2021-11-17: qty 30, 30d supply, fill #0
  Filled 2021-11-17: qty 30, 30d supply, fill #2

## 2021-09-07 ENCOUNTER — Other Ambulatory Visit: Payer: Self-pay

## 2021-09-09 ENCOUNTER — Other Ambulatory Visit: Payer: Self-pay

## 2021-09-11 ENCOUNTER — Other Ambulatory Visit: Payer: Self-pay

## 2021-10-07 ENCOUNTER — Other Ambulatory Visit: Payer: Self-pay

## 2021-10-09 ENCOUNTER — Other Ambulatory Visit: Payer: Self-pay

## 2021-10-12 ENCOUNTER — Other Ambulatory Visit: Payer: Self-pay

## 2021-11-04 ENCOUNTER — Other Ambulatory Visit (HOSPITAL_COMMUNITY): Payer: Self-pay

## 2021-11-04 MED ORDER — ESCITALOPRAM OXALATE 10 MG PO TABS
10.0000 mg | ORAL_TABLET | Freq: Every day | ORAL | 2 refills | Status: AC
  Filled 2021-11-04 – 2021-12-28 (×2): qty 30, 30d supply, fill #0

## 2021-11-04 MED ORDER — QUETIAPINE FUMARATE 25 MG PO TABS
25.0000 mg | ORAL_TABLET | Freq: Every day | ORAL | 2 refills | Status: AC
  Filled 2021-11-04 – 2021-11-18 (×3): qty 30, 30d supply, fill #0

## 2021-11-04 MED ORDER — LAMOTRIGINE 150 MG PO TABS
150.0000 mg | ORAL_TABLET | Freq: Every day | ORAL | 2 refills | Status: AC
  Filled 2021-11-04 – 2021-12-28 (×2): qty 30, 30d supply, fill #0

## 2021-11-09 ENCOUNTER — Other Ambulatory Visit: Payer: Self-pay

## 2021-11-16 ENCOUNTER — Other Ambulatory Visit: Payer: Self-pay

## 2021-11-17 ENCOUNTER — Other Ambulatory Visit: Payer: Self-pay

## 2021-11-18 ENCOUNTER — Other Ambulatory Visit: Payer: Self-pay

## 2021-11-23 ENCOUNTER — Ambulatory Visit (INDEPENDENT_AMBULATORY_CARE_PROVIDER_SITE_OTHER): Payer: Medicare HMO | Admitting: Pulmonary Disease

## 2021-11-23 ENCOUNTER — Encounter: Payer: Self-pay | Admitting: Pulmonary Disease

## 2021-11-23 ENCOUNTER — Other Ambulatory Visit: Payer: Self-pay

## 2021-11-23 VITALS — BP 120/80 | HR 84 | Temp 97.6°F | Ht 66.0 in | Wt 279.0 lb

## 2021-11-23 DIAGNOSIS — G4733 Obstructive sleep apnea (adult) (pediatric): Secondary | ICD-10-CM

## 2021-11-23 DIAGNOSIS — Z9989 Dependence on other enabling machines and devices: Secondary | ICD-10-CM | POA: Diagnosis not present

## 2021-11-23 NOTE — Progress Notes (Signed)
[facility]    [id number]    01/23/1966  Primary Care Physician:Johnson, Binnie Raileborah B, MD  Referring Physician: Marcine MatarJohnson, Deborah B, MD [address] [place],  [place] [id number]  Chief complaint:   Patient with a history of obstructive sleep apnea She feels relatively well   HPI:  Compliant with CPAP use CPAP continues to help daytime sleepiness No issues tolerating CPAP  Occasional mask leaks Medications to help him sleep have been helping but occasionally make her sleepy during the day as well Anxiety  Currently on CPAP of 15-tolerating it without difficulty  Diagnoses obstructive sleep apnea and titrated to a pressure of 18-pressure was reduced to 15 during previous visits Having some issues with her mask and she will require CPAP supplies  Stable bedtime, sometimes to go to bed at 3 PM Wakes up a few times during the night Wakes up different times in the mornings  Has managed to lose a little bit of weight since the last time she was here History of PTSD, history of depression History of morbid obesity Hypertension   Outpatient Encounter Medications as of 11/23/2021  Medication Sig   amLODipine (NORVASC) 10 MG tablet Take 1 tablet (10 mg total) by mouth daily.   atorvastatin (LIPITOR) 20 MG tablet Take 1 tablet (20 mg total) by mouth daily.   atorvastatin (LIPITOR) 20 MG tablet TAKE 1 TABLET (20 MG TOTAL) BY MOUTH DAILY.   atorvastatin (LIPITOR) 20 MG tablet TAKE 1 TABLET (20 MG TOTAL) BY MOUTH DAILY.   atorvastatin (LIPITOR) 20 MG tablet TAKE 1 TABLET (20 MG TOTAL) BY MOUTH DAILY.   celecoxib (CELEBREX) 200 MG capsule Take 1 capsule (200 mg total) by mouth 2 (two) times daily as needed.   ciclopirox (PENLAC) 8 % solution Apply topically at bedtime. Apply over nail and surrounding skin. Apply daily over previous coat. After seven (7) days, may remove with alcohol and continue cycle.   cyclobenzaprine (FLEXERIL) 5 MG tablet Take 1 tablet (5 mg  total) by mouth daily as needed for muscle spasms.   escitalopram (LEXAPRO) 10 MG tablet TAKE 1 TABLET BY MOUTH ONCE A DAY   escitalopram (LEXAPRO) 10 MG tablet TAKE 1 TABLET BY MOUTH ONCE A DAY   escitalopram (LEXAPRO) 10 MG tablet TAKE 1 TABLET BY MOUTH ONCE A DAY   escitalopram (LEXAPRO) 10 MG tablet TAKE 1 TABLET BY MOUTH ONCE A DAY   escitalopram (LEXAPRO) 10 MG tablet TAKE 1 TABLET BY MOUTH ONCE A DAY   escitalopram (LEXAPRO) 10 MG tablet TAKE 1 TABLET BY MOUTH ONCE A DAY   escitalopram (LEXAPRO) 10 MG tablet Take 1 tablet (10 mg total) by mouth daily.   fluticasone (FLONASE) 50 MCG/ACT nasal spray Place 1 spray into both nostrils daily.   hydrOXYzine (VISTARIL) 25 MG capsule Take 25 mg by mouth 3 (three) times daily as needed.   ketoconazole (NIZORAL) 2 % cream Apply 1 application topically daily.   lamoTRIgine (LAMICTAL) 100 MG tablet Take 1 tablet (100 mg total) by mouth daily.   lamoTRIgine (LAMICTAL) 150 MG tablet Take 150 mg by mouth daily.   lamoTRIgine (LAMICTAL) 150 MG tablet TAKE 1 TABLET BY MOUTH ONCE A DAY   lamoTRIgine (LAMICTAL) 150 MG tablet TAKE 1 TABLET BY MOUTH ONCE A DAY   lamoTRIgine (LAMICTAL) 150 MG tablet TAKE 1 TABLET BY MOUTH ONCE A DAY   lamoTRIgine (LAMICTAL) 150 MG tablet TAKE 1 TABLET BY MOUTH ONCE A DAY  lamoTRIgine (LAMICTAL) 150 MG tablet TAKE 1 TABLET BY MOUTH ONCE A DAY   lamoTRIgine (LAMICTAL) 150 MG tablet TAKE 1 TABLET BY MOUTH ONCE A DAY   lamoTRIgine (LAMICTAL) 150 MG tablet Take 1 tablet (150 mg total) by mouth daily.   levocetirizine (XYZAL) 5 MG tablet Take 1 tablet (5 mg total) by mouth every evening.   loratadine (CLARITIN) 10 MG tablet Take 1 tablet (10 mg total) by mouth daily.   losartan (COZAAR) 25 MG tablet Take 1 tablet (25 mg total) by mouth daily.   meloxicam (MOBIC) 15 MG tablet Take 1 tablet (15 mg total) by mouth daily.   Multiple Vitamin (MULTIVITAMIN) tablet Take 1 tablet by mouth daily. Reported on 04/28/2016   pantoprazole  (PROTONIX) 40 MG tablet Take 1 tablet (40 mg total) by mouth daily.   pantoprazole (PROTONIX) 40 MG tablet TAKE 1 TABLET (40 MG TOTAL) BY MOUTH DAILY.   Plecanatide (TRULANCE) 3 MG TABS Take 3 mg by mouth daily.   propranolol (INDERAL) 40 MG tablet Take 40-80 mg by mouth 2 (two) times daily as needed.   Prucalopride Succinate (MOTEGRITY) 2 MG TABS Take 1 tablet (2 mg total) by mouth daily.   QUEtiapine (SEROQUEL) 25 MG tablet Take 1 tablet (25 mg total) by mouth at bedtime.   traMADol (ULTRAM) 50 MG tablet Take 1-2 tablets (50-100 mg total) by mouth every 6 (six) hours as needed.   amLODipine (NORVASC) 10 MG tablet TAKE 1 TABLET (10 MG TOTAL) BY MOUTH DAILY.   escitalopram (LEXAPRO) 10 MG tablet Take 1 tablet (10 mg total) by mouth daily.   escitalopram (LEXAPRO) 10 MG tablet TAKE 1 TABLET BY MOUTH ONCE DAILY.   lamoTRIgine (LAMICTAL) 150 MG tablet TAKE 1 TABLET BY MOUTH ONCE DAILY.   losartan (COZAAR) 25 MG tablet TAKE 1 TABLET (25 MG TOTAL) BY MOUTH DAILY.   propranolol (INDERAL) 40 MG tablet TAKE 1 TABLET BY MOUTH TWICE A DAY AS NEEDED   No facility-administered encounter medications on file as of 11/23/2021.    Allergies as of 11/23/2021 - Review Complete 11/23/2021  Allergen Reaction Noted   Benadryl [diphenhydramine hcl] Itching 11/06/2012   Milk-related compounds Diarrhea 09/22/2016   Oxycodone-acetaminophen Nausea And Vomiting 02/27/2007   Penicillins Nausea Only 02/27/2007    Past Medical History:  Diagnosis Date   AC (acromioclavicular) joint bone spurs    Acid reflux    Anxiety    Arthritis    knees   CTS (carpal tunnel syndrome)    Bilateral   Depression    Gastric ulcer    GERD (gastroesophageal reflux disease)    Headache    Heel spur    History of chicken pox    Hypertension    Morbid obesity (HCC)    Panic attacks    Plantar fasciitis    Sickle cell trait (HCC)    Sleep apnea    uses sometimes   Tendonitis    Feet    Past Surgical History:  Procedure  Laterality Date   CARPAL TUNNEL RELEASE Right 10/26/2018   Procedure: RIGHT CARPAL TUNNEL RELEASE;  Surgeon: Kathryne Hitch, MD;  Location: [facility];  Service: Orthopedics;  Laterality: Right;   CHOLECYSTECTOMY     COLONOSCOPY WITH PROPOFOL N/A 01/09/2018   Procedure: COLONOSCOPY WITH PROPOFOL;  Surgeon: Corbin Ade, MD;  [facility];  Service: Endoscopy;  Laterality: N/A;  8:45am   TUBAL LIGATION  08/29/04   WISDOM TOOTH EXTRACTION  Family History  Problem Relation Age of Onset   Hypertension Father 28       Deceased   Heart attack Father    Hypertension Mother        Living   Arthritis Mother    Diabetes Mother    Dementia Mother    Depression Mother    Arthritis Sister        x2   Lung cancer Sister        Deceased   Cancer Other        Paternal & Maternal Aunts   Heart attack Maternal Grandmother    Neuropathy Sister    Bipolar disorder Sister    Schizophrenia Sister    Heart disease Brother        x1   Alcohol abuse Brother    Alcoholism Brother        x2   Colon cancer Neg Hx     Social History   Socioeconomic History   Marital status: Single    Spouse name: Not on file   Number of children: 0   Years of education: Not on file   Highest education level: Some college, no degree  Occupational History    Comment: not employed   Tobacco Use   Smoking status: Never   Smokeless tobacco: Never  Vaping Use   Vaping Use: Never used  Substance and Sexual Activity   Alcohol use: No   Drug use: No   Sexual activity: Not Currently    Birth control/protection: Post-menopausal    Comment: BTL  Other Topics Concern   Not on file  Social History Narrative   Not on file   Social Determinants of Health   Financial Resource Strain: Not on file  Food Insecurity: Not on file  Transportation Needs: Not on file  Physical Activity: Not on file  Stress: Not on file  Social Connections: Not on file  Intimate Partner  Violence: Not on file   Review of Systems  Constitutional:  Negative for fatigue.  HENT: Negative.    Eyes: Negative.   Respiratory:  Positive for apnea.   Cardiovascular: Negative.   Gastrointestinal: Negative.   Endocrine: Negative.   Genitourinary: Negative.  Negative for vaginal bleeding.  Psychiatric/Behavioral:  Positive for sleep disturbance.   All other systems reviewed and are negative. Vitals:   11/23/21 1603  BP: 120/80  Pulse: 84  Temp: 97.6 F (36.4 C)  SpO2: 98%   Physical Exam Constitutional:      Appearance: She is well-developed. She is obese.     Comments: Obese  HENT:     Head: Normocephalic and atraumatic.     Mouth/Throat:     Mouth: Mucous membranes are moist.  Eyes:     Pupils: Pupils are equal, round, and reactive to light.  Neck:     Thyroid: No thyromegaly.     Trachea: No tracheal deviation.  Cardiovascular:     Rate and Rhythm: Normal rate and regular rhythm.     Pulses: Normal pulses.     Heart sounds: Normal heart sounds.  Pulmonary:     Effort: Pulmonary effort is normal. No respiratory distress.     Breath sounds: Normal breath sounds. No stridor. No wheezing or rhonchi.  Musculoskeletal:     Cervical back: No rigidity or tenderness.  Neurological:     Mental Status: She is alert.  Psychiatric:        Mood and Affect: Mood normal.   Sleep study titration  did reveal pressure requirement of 18  Results of the Epworth flowsheet 12/29/2018 09/28/2018  Sitting and reading 0 3  Watching TV 3 3  Sitting, inactive in a public place (e.g. a theatre or a meeting) 0 3  As a passenger in a car for an hour without a break 0 0  Lying down to rest in the afternoon when circumstances permit 0 3  Sitting and talking to someone 0 0  Sitting quietly after a lunch without alcohol 0 0  In a car, while stopped for a few minutes in traffic 0 0  Total score 3 12   Is taking at home and reviewed previous compliance  Compliance data shows 73%  compliance Set between 5 and 20 Occasional leaks 95 percentile pressure of 17.8 Residual AHI of 0.3  Assessment:    Obstructive sleep apnea -Encouraged to continue CPAP on a nightly basis  Obesity -Encouraged regarding weight loss efforts  Environmental allergies  Anxiety  Plan/Recommendations:  Continue CPAP -No changes need made at present  Needs CPAP supplies -New mask  Encouraged increasing physical activity as tolerated  Follow-up in 6 months   Virl DiamondAdewale Izzak Fries MD [facility] 11/23/2021, 4:09 PM  CC: Marcine MatarJohnson, Deborah B, MD

## 2021-11-23 NOTE — Patient Instructions (Signed)
Continue CPAP on a nightly basis  Graded exercise as tolerated  Continue weight loss efforts  Try and maintain a regular sleep routine  CPAP supplies to DME company  I will see you back in 6 months

## 2021-12-10 ENCOUNTER — Other Ambulatory Visit: Payer: Self-pay | Admitting: Internal Medicine

## 2021-12-10 DIAGNOSIS — Z1231 Encounter for screening mammogram for malignant neoplasm of breast: Secondary | ICD-10-CM

## 2021-12-22 ENCOUNTER — Other Ambulatory Visit: Payer: Self-pay

## 2021-12-23 ENCOUNTER — Other Ambulatory Visit: Payer: Self-pay

## 2021-12-28 ENCOUNTER — Other Ambulatory Visit: Payer: Self-pay

## 2021-12-31 ENCOUNTER — Ambulatory Visit: Payer: Medicare HMO

## 2022-01-05 ENCOUNTER — Ambulatory Visit: Payer: Medicare HMO

## 2022-01-18 ENCOUNTER — Other Ambulatory Visit (HOSPITAL_COMMUNITY): Payer: Self-pay

## 2022-01-18 ENCOUNTER — Other Ambulatory Visit: Payer: Self-pay

## 2022-01-27 ENCOUNTER — Other Ambulatory Visit (HOSPITAL_COMMUNITY): Payer: Self-pay

## 2022-01-27 MED ORDER — ESCITALOPRAM OXALATE 10 MG PO TABS
10.0000 mg | ORAL_TABLET | Freq: Every day | ORAL | 2 refills | Status: AC
Start: 2022-01-27 — End: ?
  Filled 2022-01-27: qty 30, 30d supply, fill #0

## 2022-01-27 MED ORDER — LAMOTRIGINE 150 MG PO TABS
150.0000 mg | ORAL_TABLET | Freq: Every day | ORAL | 2 refills | Status: DC
  Filled 2022-01-27: qty 30, 30d supply, fill #0

## 2022-03-08 ENCOUNTER — Telehealth: Payer: Self-pay | Admitting: Orthopaedic Surgery

## 2022-03-08 NOTE — Telephone Encounter (Signed)
Called and scheduled pt to see [facility] tomorrow  ?

## 2022-03-08 NOTE — Telephone Encounter (Signed)
Patient has appt for Lidocaine injection for 03/25/22 but is requesting a sooner appt possibly with another doctor just for the injection she is hurting in her right knee and would like to know if any ointment or medication can be prescribed for the pain until the appt. ?

## 2022-03-08 NOTE — Telephone Encounter (Signed)
Please advise on medication. Ill get her worked in with [place] next week ?

## 2022-03-09 ENCOUNTER — Ambulatory Visit: Payer: Medicare HMO | Admitting: Physician Assistant

## 2022-03-25 ENCOUNTER — Ambulatory Visit (INDEPENDENT_AMBULATORY_CARE_PROVIDER_SITE_OTHER): Payer: Medicare HMO | Admitting: Orthopaedic Surgery

## 2022-03-25 ENCOUNTER — Encounter: Payer: Self-pay | Admitting: Orthopaedic Surgery

## 2022-03-25 DIAGNOSIS — M25561 Pain in right knee: Secondary | ICD-10-CM | POA: Diagnosis not present

## 2022-03-25 DIAGNOSIS — G8929 Other chronic pain: Secondary | ICD-10-CM | POA: Diagnosis not present

## 2022-03-25 MED ORDER — LIDOCAINE HCL 1 % IJ SOLN
3.0000 mL | INTRAMUSCULAR | Status: AC | PRN
  Administered 2022-03-25: 3 mL

## 2022-03-25 MED ORDER — METHYLPREDNISOLONE ACETATE 40 MG/ML IJ SUSP
40.0000 mg | INTRAMUSCULAR | Status: AC | PRN
  Administered 2022-03-25: 40 mg via INTRA_ARTICULAR

## 2022-03-25 NOTE — Progress Notes (Signed)
The patient is someone I have not seen in about a year.  She has significant arthritis in both of her knees.  She has a BMI of 45.8 today.  Her last BMI was 47 says she really not lost much weight.  She is not a diabetic.  She never did inquire about any type of bariatric surgery.  I did suggest she talk to her primary care physician about different weight loss medications.  She is requesting a steroid injection in her right knee today.  She also want me to take a look at her right hand middle finger.  She has had no changes acutely in her medical status.  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  I did place a steroid injection in her right knee today without difficulty.  I did look at her right hand.  She does have a palpable cyst type of mass on the volar aspect near the MCP joint.  There is no active triggering but it is painful to her.  I would like her to see Dr. Frazier Butt for her right hand middle finger cystlike structure that is painful.  From a knee standpoint, I have still recommended weight loss and she will follow-up as needed for her knees.  All question concerns were answered and addressed.  Procedure Note  Patient: Amy Perez [facility]             Date of Birth: Aug 19, 1966           MRN: [id number]             Visit Date: 03/25/2022  Procedures: Visit Diagnoses:  1. Severe obesity (BMI >= 40) (HCC)   2. Chronic pain of right knee     Large Joint Inj: R knee on 03/25/2022 2:05 PM Indications: diagnostic evaluation and pain Details: 22 G 1.5 in needle, superolateral approach  Arthrogram: No  Medications: 3 mL lidocaine 1 %; 40 mg methylPREDNISolone acetate 40 MG/ML Outcome: tolerated well, no immediate complications Procedure, treatment alternatives, risks and benefits explained, specific risks discussed. Consent was given by the patient. Immediately prior to procedure a time out was called to verify the correct patient, procedure, equipment, support staff and site/side marked  as required. Patient was prepped and draped in the usual sterile fashion.

## 2022-03-26 ENCOUNTER — Other Ambulatory Visit: Payer: Self-pay

## 2022-04-01 ENCOUNTER — Ambulatory Visit: Payer: Medicare HMO | Admitting: Orthopedic Surgery

## 2022-05-10 ENCOUNTER — Other Ambulatory Visit (HOSPITAL_COMMUNITY): Payer: Self-pay

## 2022-05-10 MED ORDER — ESCITALOPRAM OXALATE 10 MG PO TABS
10.0000 mg | ORAL_TABLET | Freq: Every day | ORAL | 2 refills | Status: AC
  Filled 2022-05-10: qty 30, 30d supply, fill #0

## 2022-05-10 MED ORDER — LAMOTRIGINE 150 MG PO TABS
150.0000 mg | ORAL_TABLET | Freq: Every day | ORAL | 2 refills | Status: AC
  Filled 2022-05-10: qty 30, 30d supply, fill #0

## 2022-05-12 ENCOUNTER — Ambulatory Visit (INDEPENDENT_AMBULATORY_CARE_PROVIDER_SITE_OTHER): Payer: Medicare HMO | Admitting: Pulmonary Disease

## 2022-05-12 ENCOUNTER — Encounter: Payer: Self-pay | Admitting: Pulmonary Disease

## 2022-05-12 VITALS — BP 122/78 | HR 78 | Temp 98.5°F | Ht 66.0 in | Wt 276.0 lb

## 2022-05-12 DIAGNOSIS — Z9989 Dependence on other enabling machines and devices: Secondary | ICD-10-CM | POA: Diagnosis not present

## 2022-05-12 DIAGNOSIS — G4733 Obstructive sleep apnea (adult) (pediatric): Secondary | ICD-10-CM

## 2022-05-12 NOTE — Progress Notes (Signed)
Amy Perez [facility]    [id number]    [birth year]  Primary Care Physician:Johnson, Binnie Rail, MD  Referring Physician: Marcine Matar, MD [address] [place] [place],  [place] [address]  Chief complaint:   Patient with a history of obstructive sleep apnea She feels relatively well   HPI:  Compliant with CPAP use CPAP continues to help daytime sleepiness No issues tolerating CPAP  Having some issues with a rash of her face with the mask makes contact Occasional mask leaks  currently on CPAP of 15-tolerating it without difficulty  Diagnoses obstructive sleep apnea and titrated to a pressure of 18-pressure was reduced to 15 during previous visits Having some issues with her mask and she will require CPAP supplies  Stable bedtime, sometimes to go to bed at 3 PM Wakes up a few times during the night Wakes up different times in the mornings  Has managed to lose a little bit of weight since the last time she was here History of PTSD, history of depression History of morbid obesity Hypertension   Outpatient Encounter Medications as of 05/12/2022  Medication Sig   amLODipine (NORVASC) 10 MG tablet Take 1 tablet (10 mg total) by mouth daily.   atorvastatin (LIPITOR) 20 MG tablet Take 1 tablet (20 mg total) by mouth daily.   celecoxib (CELEBREX) 200 MG capsule Take 1 capsule (200 mg total) by mouth 2 (two) times daily as needed.   cyclobenzaprine (FLEXERIL) 5 MG tablet Take 1 tablet (5 mg total) by mouth daily as needed for muscle spasms.   escitalopram (LEXAPRO) 10 MG tablet TAKE 1 TABLET BY MOUTH ONCE A DAY   escitalopram (LEXAPRO) 10 MG tablet TAKE 1 TABLET BY MOUTH ONCE A DAY   escitalopram (LEXAPRO) 10 MG tablet Take 1 tablet (10 mg total) by mouth daily.   escitalopram (LEXAPRO) 10 MG tablet Take 1 tablet (10 mg total) by mouth daily.   escitalopram (LEXAPRO) 10 MG tablet Take 1 tablet (10 mg total) by mouth daily.   fluticasone (FLONASE) 50 MCG/ACT  nasal spray Place 1 spray into both nostrils daily.   hydrOXYzine (VISTARIL) 25 MG capsule Take 25 mg by mouth 3 (three) times daily as needed.   lamoTRIgine (LAMICTAL) 100 MG tablet Take 1 tablet (100 mg total) by mouth daily.   lamoTRIgine (LAMICTAL) 150 MG tablet Take 1 tablet (150 mg total) by mouth daily.   lamoTRIgine (LAMICTAL) 150 MG tablet Take 1 tablet (150 mg total) by mouth daily.   lamoTRIgine (LAMICTAL) 150 MG tablet Take 1 tablet (150 mg total) by mouth daily.   levocetirizine (XYZAL) 5 MG tablet Take 1 tablet (5 mg total) by mouth every evening.   loratadine (CLARITIN) 10 MG tablet Take 1 tablet (10 mg total) by mouth daily.   losartan (COZAAR) 25 MG tablet Take 1 tablet (25 mg total) by mouth daily.   meloxicam (MOBIC) 15 MG tablet Take 1 tablet (15 mg total) by mouth daily.   Multiple Vitamin (MULTIVITAMIN) tablet Take 1 tablet by mouth daily. Reported on 04/28/2016   pantoprazole (PROTONIX) 40 MG tablet Take 1 tablet (40 mg total) by mouth daily.   Plecanatide (TRULANCE) 3 MG TABS Take 3 mg by mouth daily.   propranolol (INDERAL) 40 MG tablet Take 40-80 mg by mouth 2 (two) times daily as needed.   Prucalopride Succinate (MOTEGRITY) 2 MG TABS Take 1 tablet (2 mg total) by mouth daily.   QUEtiapine (SEROQUEL) 25 MG  tablet Take 1 tablet (25 mg total) by mouth at bedtime.   [DISCONTINUED] amLODipine (NORVASC) 10 MG tablet TAKE 1 TABLET (10 MG TOTAL) BY MOUTH DAILY.   [DISCONTINUED] atorvastatin (LIPITOR) 20 MG tablet TAKE 1 TABLET (20 MG TOTAL) BY MOUTH DAILY.   [DISCONTINUED] atorvastatin (LIPITOR) 20 MG tablet TAKE 1 TABLET (20 MG TOTAL) BY MOUTH DAILY.   [DISCONTINUED] atorvastatin (LIPITOR) 20 MG tablet TAKE 1 TABLET (20 MG TOTAL) BY MOUTH DAILY.   [DISCONTINUED] ciclopirox (PENLAC) 8 % solution Apply topically at bedtime. Apply over nail and surrounding skin. Apply daily over previous coat. After seven (7) days, may remove with alcohol and continue cycle. (Patient not taking:  Reported on 05/12/2022)   [DISCONTINUED] escitalopram (LEXAPRO) 10 MG tablet Take 1 tablet (10 mg total) by mouth daily.   [DISCONTINUED] escitalopram (LEXAPRO) 10 MG tablet TAKE 1 TABLET BY MOUTH ONCE A DAY   [DISCONTINUED] escitalopram (LEXAPRO) 10 MG tablet TAKE 1 TABLET BY MOUTH ONCE A DAY   [DISCONTINUED] escitalopram (LEXAPRO) 10 MG tablet TAKE 1 TABLET BY MOUTH ONCE DAILY.   [DISCONTINUED] escitalopram (LEXAPRO) 10 MG tablet TAKE 1 TABLET BY MOUTH ONCE A DAY (Patient not taking: Reported on 05/12/2022)   [DISCONTINUED] escitalopram (LEXAPRO) 10 MG tablet TAKE 1 TABLET BY MOUTH ONCE A DAY (Patient not taking: Reported on 05/12/2022)   [DISCONTINUED] ketoconazole (NIZORAL) 2 % cream Apply 1 application topically daily.   [DISCONTINUED] lamoTRIgine (LAMICTAL) 150 MG tablet Take 150 mg by mouth daily.   [DISCONTINUED] lamoTRIgine (LAMICTAL) 150 MG tablet TAKE 1 TABLET BY MOUTH ONCE A DAY   [DISCONTINUED] lamoTRIgine (LAMICTAL) 150 MG tablet TAKE 1 TABLET BY MOUTH ONCE A DAY   [DISCONTINUED] lamoTRIgine (LAMICTAL) 150 MG tablet TAKE 1 TABLET BY MOUTH ONCE A DAY   [DISCONTINUED] lamoTRIgine (LAMICTAL) 150 MG tablet TAKE 1 TABLET BY MOUTH ONCE DAILY.   [DISCONTINUED] lamoTRIgine (LAMICTAL) 150 MG tablet TAKE 1 TABLET BY MOUTH ONCE A DAY   [DISCONTINUED] lamoTRIgine (LAMICTAL) 150 MG tablet TAKE 1 TABLET BY MOUTH ONCE A DAY   [DISCONTINUED] lamoTRIgine (LAMICTAL) 150 MG tablet TAKE 1 TABLET BY MOUTH ONCE A DAY   [DISCONTINUED] losartan (COZAAR) 25 MG tablet TAKE 1 TABLET (25 MG TOTAL) BY MOUTH DAILY.   [DISCONTINUED] pantoprazole (PROTONIX) 40 MG tablet TAKE 1 TABLET (40 MG TOTAL) BY MOUTH DAILY.   [DISCONTINUED] propranolol (INDERAL) 40 MG tablet TAKE 1 TABLET BY MOUTH TWICE A DAY AS NEEDED   [DISCONTINUED] traMADol (ULTRAM) 50 MG tablet Take 1-2 tablets (50-100 mg total) by mouth every 6 (six) hours as needed. (Patient not taking: Reported on 05/12/2022)   No facility-administered encounter  medications on file as of 05/12/2022.    Allergies as of 05/12/2022 - Review Complete 05/12/2022  Allergen Reaction Noted   Benadryl [diphenhydramine hcl] Itching 11/06/2012   Milk-related compounds Diarrhea 09/22/2016   Oxycodone-acetaminophen Nausea And Vomiting 02/27/2007   Penicillins Nausea Only 02/27/2007    Past Medical History:  Diagnosis Date   AC (acromioclavicular) joint bone spurs    Acid reflux    Anxiety    Arthritis    knees   CTS (carpal tunnel syndrome)    Bilateral   Depression    Gastric ulcer    GERD (gastroesophageal reflux disease)    Headache    Heel spur    History of chicken pox    Hypertension    Morbid obesity (HCC)    Panic attacks    Plantar fasciitis    Sickle cell trait (HCC)  Sleep apnea    uses sometimes   Tendonitis    Feet    Past Surgical History:  Procedure Laterality Date   CARPAL TUNNEL RELEASE Right 10/26/2018   Procedure: RIGHT CARPAL TUNNEL RELEASE;  Surgeon: Kathryne Hitch, MD;  Location: [facility];  Service: Orthopedics;  Laterality: Right;   CHOLECYSTECTOMY     COLONOSCOPY WITH PROPOFOL N/A 01/09/2018   Procedure: COLONOSCOPY WITH PROPOFOL;  Surgeon: Corbin Ade, MD;  Location: [facility];  Service: Endoscopy;  Laterality: N/A;  8:45am   TUBAL LIGATION  08/29/04   WISDOM TOOTH EXTRACTION      Family History  Problem Relation Age of Onset   Hypertension Father 16       Deceased   Heart attack Father    Hypertension Mother        Living   Arthritis Mother    Diabetes Mother    Dementia Mother    Depression Mother    Arthritis Sister        x2   Lung cancer Sister        Deceased   Cancer Other        Paternal & Maternal Aunts   Heart attack Maternal Grandmother    Neuropathy Sister    Bipolar disorder Sister    Schizophrenia Sister    Heart disease Brother        x1   Alcohol abuse Brother    Alcoholism Brother        x2   Colon cancer Neg Hx     Social History    Socioeconomic History   Marital status: Single    Spouse name: Not on file   Number of children: 0   Years of education: Not on file   Highest education level: Some college, no degree  Occupational History    Comment: not employed   Tobacco Use   Smoking status: Never   Smokeless tobacco: Never  Vaping Use   Vaping Use: Never used  Substance and Sexual Activity   Alcohol use: No   Drug use: No   Sexual activity: Not Currently    Birth control/protection: Post-menopausal    Comment: BTL  Other Topics Concern   Not on file  Social History Narrative   Not on file   Social Determinants of Health   Financial Resource Strain: High Risk (09/30/2018)   Overall Financial Resource Strain (CARDIA)    Difficulty of Paying Living Expenses: Hard  Food Insecurity: Food Insecurity Present (09/30/2018)   Hunger Vital Sign    Worried About Programme researcher, broadcasting/film/video in the Last Year: Often true    Ran Out of Food in the Last Year: Often true  Transportation Needs: Unmet Transportation Needs (09/30/2018)   PRAPARE - Administrator, Civil Service (Medical): Yes    Lack of Transportation (Non-Medical): Yes  Physical Activity: Inactive (09/30/2018)   Exercise Vital Sign    Days of Exercise per Week: 0 days    Minutes of Exercise per Session: 0 min  Stress: Not on file  Social Connections: Unknown (09/30/2018)   Social Connection and Isolation Panel [NHANES]    Frequency of Communication with Friends and Family: Not on file    Frequency of Social Gatherings with Friends and Family: Not on file    Attends Religious Services: More than 4 times per year    Active Member of [employer] or Organizations: No    Attends Banker Meetings: Never  Marital Status: Never married  Intimate Partner Violence: Not At Risk (09/30/2018)   Humiliation, Afraid, Rape, and Kick questionnaire    Fear of Current or Ex-Partner: No    Emotionally Abused: No    Physically Abused: No    Sexually  Abused: No   Review of Systems  Constitutional:  Negative for fatigue.  HENT: Negative.    Eyes: Negative.   Respiratory:  Positive for apnea.   Cardiovascular: Negative.   Gastrointestinal: Negative.   Endocrine: Negative.   Genitourinary: Negative.  Negative for vaginal bleeding.  Psychiatric/Behavioral:  Positive for sleep disturbance.   All other systems reviewed and are negative.  There were no vitals filed for this visit.  Physical Exam Constitutional:      Appearance: She is well-developed. She is obese.     Comments: Obese  HENT:     Head: Normocephalic and atraumatic.     Mouth/Throat:     Mouth: Mucous membranes are moist.  Eyes:     Pupils: Pupils are equal, round, and reactive to light.  Neck:     Thyroid: No thyromegaly.     Trachea: No tracheal deviation.  Cardiovascular:     Rate and Rhythm: Normal rate and regular rhythm.     Pulses: Normal pulses.     Heart sounds: Normal heart sounds.  Pulmonary:     Effort: Pulmonary effort is normal. No respiratory distress.     Breath sounds: Normal breath sounds. No stridor. No wheezing or rhonchi.  Musculoskeletal:     Cervical back: No rigidity or tenderness.  Neurological:     Mental Status: She is alert.  Psychiatric:        Mood and Affect: Mood normal.    Sleep study titration did reveal pressure requirement of 18     12/29/2018    2:00 PM 09/28/2018    4:00 PM  Results of the Epworth flowsheet  Sitting and reading 0 3  Watching TV 3 3  Sitting, inactive in a public place (e.g. a theatre or a meeting) 0 3  As a passenger in a car for an hour without a break 0 0  Lying down to rest in the afternoon when circumstances permit 0 3  Sitting and talking to someone 0 0  Sitting quietly after a lunch without alcohol 0 0  In a car, while stopped for a few minutes in traffic 0 0  Total score 3 12   Is taking at home and reviewed previous compliance  Compliance data reviewed showing 80% compliance Average  use of 5 hours 17 minutes Set between 5 and 20 95% a pressure of 9.8 AHI of 0.9  Assessment:    Obstructive sleep apnea -Encouraged to continue CPAP on a nightly basis  Obesity -Encouraged regarding weight loss efforts -Has become more active  Environmental allergies  Anxiety Significant knee pain   Plan/Recommendations:  Continue CPAP -No changes need made at present  CPAP supplies as needed  Follow-up in 6 months  May try hydrocortisone cream for face rash  Mask change may also be an option    Virl Diamond MD  Pulmonary and Critical Care 05/12/2022, 4:13 PM  CC: Marcine Matar, MD

## 2022-05-12 NOTE — Patient Instructions (Signed)
You may try a cortisone cream around your face where the rash is showing up  A different mask may be an option A cloth mask may be an option  I will see you back in 6 months  Call us with significant concerns

## 2022-06-07 ENCOUNTER — Other Ambulatory Visit (HOSPITAL_COMMUNITY): Payer: Self-pay

## 2022-06-29 ENCOUNTER — Other Ambulatory Visit: Payer: Self-pay | Admitting: Physician Assistant

## 2022-06-29 ENCOUNTER — Telehealth: Payer: Self-pay | Admitting: Orthopedic Surgery

## 2022-06-29 MED ORDER — TRAMADOL HCL 50 MG PO TABS: 50.0000 mg | ORAL_TABLET | Freq: Four times a day (QID) | ORAL | 0 refills | Status: AC | PRN

## 2022-06-29 NOTE — Telephone Encounter (Signed)
Ms. Amy Perez called in requesting pain medication for her knee.  She states that she is currently not taking anything, but needs something for the pain and inflammation. She denies any new injury to the knee. She uses [facility].  Her call back # is [phone].

## 2022-06-29 NOTE — Telephone Encounter (Signed)
Pt s/p injection 03/25/22 please see message below and advise.

## 2022-06-30 ENCOUNTER — Telehealth: Payer: Self-pay | Admitting: Physician Assistant

## 2022-06-30 NOTE — Telephone Encounter (Signed)
Pt called requesting that for now on all medications scripts be sent to [facility] in [place]

## 2022-08-23 ENCOUNTER — Other Ambulatory Visit: Payer: Self-pay | Admitting: Nurse Practitioner

## 2022-08-23 DIAGNOSIS — Z1231 Encounter for screening mammogram for malignant neoplasm of breast: Secondary | ICD-10-CM

## 2022-11-04 ENCOUNTER — Ambulatory Visit: Payer: Medicare HMO

## 2022-11-05 ENCOUNTER — Ambulatory Visit: Payer: Medicare HMO

## 2022-11-09 ENCOUNTER — Inpatient Hospital Stay: Admission: RE | Admit: 2022-11-09 | Payer: Medicare HMO | Source: Ambulatory Visit

## 2022-11-09 ENCOUNTER — Ambulatory Visit: Payer: Medicare HMO

## 2022-12-13 ENCOUNTER — Other Ambulatory Visit: Payer: Self-pay | Admitting: Nurse Practitioner

## 2022-12-13 DIAGNOSIS — Z1231 Encounter for screening mammogram for malignant neoplasm of breast: Secondary | ICD-10-CM

## 2022-12-14 ENCOUNTER — Ambulatory Visit: Payer: Medicare HMO

## 2022-12-22 ENCOUNTER — Ambulatory Visit: Payer: Medicare HMO | Admitting: Pulmonary Disease

## 2022-12-30 ENCOUNTER — Encounter: Payer: Self-pay | Admitting: Radiology

## 2023-01-27 ENCOUNTER — Ambulatory Visit
Admission: RE | Admit: 2023-01-27 | Discharge: 2023-01-27 | Disposition: A | Discharge status: Home / Self Care | Payer: Medicare PPO | Source: Ambulatory Visit | Attending: Nurse Practitioner | Admitting: Nurse Practitioner

## 2023-01-27 DIAGNOSIS — Z1231 Encounter for screening mammogram for malignant neoplasm of breast: Secondary | ICD-10-CM

## 2023-01-31 ENCOUNTER — Other Ambulatory Visit: Payer: Self-pay | Admitting: Nurse Practitioner

## 2023-01-31 DIAGNOSIS — R928 Other abnormal and inconclusive findings on diagnostic imaging of breast: Secondary | ICD-10-CM

## 2023-02-01 DIAGNOSIS — F4310 Post-traumatic stress disorder, unspecified: Secondary | ICD-10-CM | POA: Diagnosis not present

## 2023-02-10 ENCOUNTER — Ambulatory Visit
Admission: RE | Admit: 2023-02-10 | Discharge: 2023-02-10 | Disposition: A | Discharge status: Home / Self Care | Payer: Medicare PPO | Source: Ambulatory Visit | Attending: Nurse Practitioner | Admitting: Nurse Practitioner

## 2023-02-10 ENCOUNTER — Other Ambulatory Visit: Payer: Self-pay | Admitting: Nurse Practitioner

## 2023-02-10 ENCOUNTER — Telehealth: Payer: Self-pay | Admitting: Internal Medicine

## 2023-02-10 DIAGNOSIS — R921 Mammographic calcification found on diagnostic imaging of breast: Secondary | ICD-10-CM | POA: Diagnosis not present

## 2023-02-10 DIAGNOSIS — R928 Other abnormal and inconclusive findings on diagnostic imaging of breast: Secondary | ICD-10-CM

## 2023-02-10 NOTE — Telephone Encounter (Signed)
-----   Message from Weyman Pedro sent at 02/10/2023  1:41 PM EDT ----- Jeanene Erb pt explained that we called to sched a appt she stated that she has a new primary care doctor in [place] since she moved over there 2 years ago. I asked her if she would still want to sched the appt but she refused  ----- Message ----- From: Marcine Matar, MD Sent: 02/10/2023   1:11 PM EDT To: [facility] Admin Pool  Pt over due for f/u visit with me for chronic disease management.  Please schedule.

## 2023-02-11 NOTE — Telephone Encounter (Addendum)
Spoke Lakita  at [facility] requested that  breast biopsy ordered in Dr. Laural Benes name be cancelled . Pt is no longer under her care. Advised that the pt needs to be contacted to find out who her current PCP  is and those orders needed to be sent to them. Charlcie Cradle voiced that she had cancelled the appointment and the patient will be made aware.

## 2023-02-15 DIAGNOSIS — F4310 Post-traumatic stress disorder, unspecified: Secondary | ICD-10-CM | POA: Diagnosis not present

## 2023-02-16 DIAGNOSIS — F331 Major depressive disorder, recurrent, moderate: Secondary | ICD-10-CM | POA: Diagnosis not present

## 2023-02-16 DIAGNOSIS — F411 Generalized anxiety disorder: Secondary | ICD-10-CM | POA: Diagnosis not present

## 2023-02-21 ENCOUNTER — Ambulatory Visit
Admission: RE | Admit: 2023-02-21 | Discharge: 2023-02-21 | Disposition: A | Discharge status: Home / Self Care | Payer: Medicare PPO | Source: Ambulatory Visit | Attending: Nurse Practitioner | Admitting: Nurse Practitioner

## 2023-02-21 DIAGNOSIS — R921 Mammographic calcification found on diagnostic imaging of breast: Secondary | ICD-10-CM | POA: Diagnosis not present

## 2023-02-21 DIAGNOSIS — R928 Other abnormal and inconclusive findings on diagnostic imaging of breast: Secondary | ICD-10-CM

## 2023-02-21 DIAGNOSIS — N6011 Diffuse cystic mastopathy of right breast: Secondary | ICD-10-CM | POA: Diagnosis not present

## 2023-02-21 HISTORY — PX: BREAST BIOPSY: SHX20

## 2023-03-01 DIAGNOSIS — F4310 Post-traumatic stress disorder, unspecified: Secondary | ICD-10-CM | POA: Diagnosis not present

## 2023-03-15 DIAGNOSIS — F411 Generalized anxiety disorder: Secondary | ICD-10-CM | POA: Diagnosis not present

## 2023-03-29 DIAGNOSIS — F411 Generalized anxiety disorder: Secondary | ICD-10-CM | POA: Diagnosis not present

## 2023-03-30 DIAGNOSIS — L819 Disorder of pigmentation, unspecified: Secondary | ICD-10-CM | POA: Diagnosis not present

## 2023-03-30 DIAGNOSIS — E7800 Pure hypercholesterolemia, unspecified: Secondary | ICD-10-CM | POA: Diagnosis not present

## 2023-03-30 DIAGNOSIS — I10 Essential (primary) hypertension: Secondary | ICD-10-CM | POA: Diagnosis not present

## 2023-03-30 DIAGNOSIS — R921 Mammographic calcification found on diagnostic imaging of breast: Secondary | ICD-10-CM | POA: Diagnosis not present

## 2023-03-30 DIAGNOSIS — E559 Vitamin D deficiency, unspecified: Secondary | ICD-10-CM | POA: Diagnosis not present

## 2023-03-30 DIAGNOSIS — Z79899 Other long term (current) drug therapy: Secondary | ICD-10-CM | POA: Diagnosis not present

## 2023-03-30 DIAGNOSIS — Z6839 Body mass index (BMI) 39.0-39.9, adult: Secondary | ICD-10-CM | POA: Diagnosis not present

## 2023-04-19 DIAGNOSIS — F411 Generalized anxiety disorder: Secondary | ICD-10-CM | POA: Diagnosis not present

## 2023-05-05 DIAGNOSIS — F411 Generalized anxiety disorder: Secondary | ICD-10-CM | POA: Diagnosis not present

## 2023-05-12 DIAGNOSIS — F331 Major depressive disorder, recurrent, moderate: Secondary | ICD-10-CM | POA: Diagnosis not present

## 2023-05-12 DIAGNOSIS — F411 Generalized anxiety disorder: Secondary | ICD-10-CM | POA: Diagnosis not present

## 2023-05-17 DIAGNOSIS — F411 Generalized anxiety disorder: Secondary | ICD-10-CM | POA: Diagnosis not present

## 2023-05-31 DIAGNOSIS — F4310 Post-traumatic stress disorder, unspecified: Secondary | ICD-10-CM | POA: Diagnosis not present

## 2023-06-14 DIAGNOSIS — F4310 Post-traumatic stress disorder, unspecified: Secondary | ICD-10-CM | POA: Diagnosis not present

## 2023-06-14 DIAGNOSIS — K219 Gastro-esophageal reflux disease without esophagitis: Secondary | ICD-10-CM | POA: Diagnosis not present

## 2023-06-14 DIAGNOSIS — K5909 Other constipation: Secondary | ICD-10-CM | POA: Diagnosis not present

## 2023-07-05 DIAGNOSIS — F411 Generalized anxiety disorder: Secondary | ICD-10-CM | POA: Diagnosis not present

## 2023-07-18 DIAGNOSIS — Z760 Encounter for issue of repeat prescription: Secondary | ICD-10-CM | POA: Diagnosis not present

## 2023-07-18 DIAGNOSIS — U071 COVID-19: Secondary | ICD-10-CM | POA: Diagnosis not present

## 2023-07-19 DIAGNOSIS — F411 Generalized anxiety disorder: Secondary | ICD-10-CM | POA: Diagnosis not present

## 2023-07-19 DIAGNOSIS — F4310 Post-traumatic stress disorder, unspecified: Secondary | ICD-10-CM | POA: Diagnosis not present

## 2023-08-02 DIAGNOSIS — F411 Generalized anxiety disorder: Secondary | ICD-10-CM | POA: Diagnosis not present

## 2023-08-02 DIAGNOSIS — F4310 Post-traumatic stress disorder, unspecified: Secondary | ICD-10-CM | POA: Diagnosis not present

## 2023-08-16 DIAGNOSIS — F411 Generalized anxiety disorder: Secondary | ICD-10-CM | POA: Diagnosis not present

## 2023-08-16 DIAGNOSIS — F4310 Post-traumatic stress disorder, unspecified: Secondary | ICD-10-CM | POA: Diagnosis not present

## 2023-08-18 DIAGNOSIS — F5101 Primary insomnia: Secondary | ICD-10-CM | POA: Diagnosis not present

## 2023-08-18 DIAGNOSIS — F411 Generalized anxiety disorder: Secondary | ICD-10-CM | POA: Diagnosis not present

## 2023-08-18 DIAGNOSIS — F331 Major depressive disorder, recurrent, moderate: Secondary | ICD-10-CM | POA: Diagnosis not present

## 2023-08-30 DIAGNOSIS — F411 Generalized anxiety disorder: Secondary | ICD-10-CM | POA: Diagnosis not present

## 2023-08-30 DIAGNOSIS — F4310 Post-traumatic stress disorder, unspecified: Secondary | ICD-10-CM | POA: Diagnosis not present

## 2023-09-07 DIAGNOSIS — J358 Other chronic diseases of tonsils and adenoids: Secondary | ICD-10-CM | POA: Diagnosis not present

## 2023-09-07 DIAGNOSIS — R439 Unspecified disturbances of smell and taste: Secondary | ICD-10-CM | POA: Diagnosis not present

## 2023-09-07 DIAGNOSIS — J343 Hypertrophy of nasal turbinates: Secondary | ICD-10-CM | POA: Diagnosis not present

## 2023-09-07 DIAGNOSIS — H6122 Impacted cerumen, left ear: Secondary | ICD-10-CM | POA: Diagnosis not present

## 2023-09-07 DIAGNOSIS — R0982 Postnasal drip: Secondary | ICD-10-CM | POA: Diagnosis not present

## 2023-09-07 DIAGNOSIS — K2100 Gastro-esophageal reflux disease with esophagitis, without bleeding: Secondary | ICD-10-CM | POA: Diagnosis not present

## 2023-09-07 DIAGNOSIS — J309 Allergic rhinitis, unspecified: Secondary | ICD-10-CM | POA: Diagnosis not present

## 2023-09-13 DIAGNOSIS — F411 Generalized anxiety disorder: Secondary | ICD-10-CM | POA: Diagnosis not present

## 2023-09-13 DIAGNOSIS — F4310 Post-traumatic stress disorder, unspecified: Secondary | ICD-10-CM | POA: Diagnosis not present

## 2023-09-20 ENCOUNTER — Other Ambulatory Visit: Payer: Self-pay | Admitting: Nurse Practitioner

## 2023-09-20 DIAGNOSIS — R921 Mammographic calcification found on diagnostic imaging of breast: Secondary | ICD-10-CM

## 2023-09-21 ENCOUNTER — Ambulatory Visit: Payer: Medicare PPO | Admitting: Orthopaedic Surgery

## 2023-09-27 DIAGNOSIS — F411 Generalized anxiety disorder: Secondary | ICD-10-CM | POA: Diagnosis not present

## 2023-09-27 DIAGNOSIS — F4310 Post-traumatic stress disorder, unspecified: Secondary | ICD-10-CM | POA: Diagnosis not present

## 2023-10-06 DIAGNOSIS — K317 Polyp of stomach and duodenum: Secondary | ICD-10-CM | POA: Diagnosis not present

## 2023-10-06 DIAGNOSIS — D131 Benign neoplasm of stomach: Secondary | ICD-10-CM | POA: Diagnosis not present

## 2023-10-06 DIAGNOSIS — K219 Gastro-esophageal reflux disease without esophagitis: Secondary | ICD-10-CM | POA: Diagnosis not present

## 2023-10-11 DIAGNOSIS — F4310 Post-traumatic stress disorder, unspecified: Secondary | ICD-10-CM | POA: Diagnosis not present

## 2023-10-11 DIAGNOSIS — F411 Generalized anxiety disorder: Secondary | ICD-10-CM | POA: Diagnosis not present

## 2023-10-12 ENCOUNTER — Other Ambulatory Visit (INDEPENDENT_AMBULATORY_CARE_PROVIDER_SITE_OTHER): Payer: Self-pay

## 2023-10-12 ENCOUNTER — Encounter: Payer: Self-pay | Admitting: Orthopaedic Surgery

## 2023-10-12 ENCOUNTER — Ambulatory Visit (INDEPENDENT_AMBULATORY_CARE_PROVIDER_SITE_OTHER): Payer: Medicare PPO | Admitting: Orthopaedic Surgery

## 2023-10-12 ENCOUNTER — Other Ambulatory Visit (INDEPENDENT_AMBULATORY_CARE_PROVIDER_SITE_OTHER): Payer: Medicare PPO

## 2023-10-12 VITALS — Ht 66.0 in | Wt 240.0 lb

## 2023-10-12 DIAGNOSIS — G8929 Other chronic pain: Secondary | ICD-10-CM

## 2023-10-12 DIAGNOSIS — M1711 Unilateral primary osteoarthritis, right knee: Secondary | ICD-10-CM

## 2023-10-12 DIAGNOSIS — M25562 Pain in left knee: Secondary | ICD-10-CM

## 2023-10-12 DIAGNOSIS — M170 Bilateral primary osteoarthritis of knee: Secondary | ICD-10-CM

## 2023-10-12 DIAGNOSIS — M25561 Pain in right knee: Secondary | ICD-10-CM | POA: Diagnosis not present

## 2023-10-12 DIAGNOSIS — M1712 Unilateral primary osteoarthritis, left knee: Secondary | ICD-10-CM | POA: Diagnosis not present

## 2023-10-12 NOTE — Progress Notes (Signed)
The patient is someone we have not seen in a while.  She is only 57 years old but has severe arthritis in her right knee and now has developed significant left knee pain.  She has been on a weight loss journey.  When we first started seeing her her BMI was 47.  Today her BMI in the office is down to 38.74.  She has worked on activity modification.  She continues to have worsening bilateral knee pain and now her left knee is were hurting her significantly.  Examination of both knees shows slight varus malalignment.  There is significant patellofemoral crepitation and pain throughout the arc of motion of both knees.  Both knees are ligamentously stable.  X-rays of both knees today show tricompartment arthritis which is worse on the right than the left but both knees have osteophytes in all 3 compartments and significant wear to warrant knee replacement surgery at some point.  We talked in length in detail about her knee x-rays and a better knee exam.  She would still like to lose more weight she states before proceeding with knee replacement surgery.  She is going to defer any injections from now and we will wait to see how she does with further weight loss until working on considering scheduling knee replacement surgery.

## 2023-10-25 DIAGNOSIS — F4310 Post-traumatic stress disorder, unspecified: Secondary | ICD-10-CM | POA: Diagnosis not present

## 2023-10-25 DIAGNOSIS — F411 Generalized anxiety disorder: Secondary | ICD-10-CM | POA: Diagnosis not present

## 2023-12-18 ENCOUNTER — Encounter: Payer: Self-pay | Admitting: Pulmonary Disease

## 2024-03-06 ENCOUNTER — Other Ambulatory Visit: Payer: Self-pay | Admitting: Internal Medicine

## 2024-03-06 DIAGNOSIS — R921 Mammographic calcification found on diagnostic imaging of breast: Secondary | ICD-10-CM

## 2024-04-24 ENCOUNTER — Ambulatory Visit
Admission: RE | Admit: 2024-04-24 | Discharge: 2024-04-24 | Disposition: A | Discharge status: Home / Self Care | Source: Ambulatory Visit | Attending: Internal Medicine | Admitting: Internal Medicine

## 2024-04-24 DIAGNOSIS — R921 Mammographic calcification found on diagnostic imaging of breast: Secondary | ICD-10-CM

## 2024-05-10 ENCOUNTER — Ambulatory Visit: Admitting: Pulmonary Disease

## 2024-06-15 ENCOUNTER — Emergency Department (HOSPITAL_BASED_OUTPATIENT_CLINIC_OR_DEPARTMENT_OTHER)
Admission: EM | Admit: 2024-06-15 | Discharge: 2024-06-15 | Disposition: A | Discharge status: Home / Self Care | Attending: Emergency Medicine | Admitting: Emergency Medicine

## 2024-06-15 ENCOUNTER — Ambulatory Visit: Payer: Self-pay | Admitting: Pulmonary Disease

## 2024-06-15 ENCOUNTER — Encounter (HOSPITAL_BASED_OUTPATIENT_CLINIC_OR_DEPARTMENT_OTHER): Payer: Self-pay | Admitting: Emergency Medicine

## 2024-06-15 ENCOUNTER — Ambulatory Visit: Admitting: Adult Health

## 2024-06-15 ENCOUNTER — Other Ambulatory Visit: Payer: Self-pay

## 2024-06-15 ENCOUNTER — Emergency Department (HOSPITAL_BASED_OUTPATIENT_CLINIC_OR_DEPARTMENT_OTHER)

## 2024-06-15 DIAGNOSIS — R1013 Epigastric pain: Secondary | ICD-10-CM | POA: Insufficient documentation

## 2024-06-15 DIAGNOSIS — R0602 Shortness of breath: Secondary | ICD-10-CM | POA: Diagnosis present

## 2024-06-15 DIAGNOSIS — R0789 Other chest pain: Secondary | ICD-10-CM | POA: Diagnosis present

## 2024-06-15 DIAGNOSIS — K219 Gastro-esophageal reflux disease without esophagitis: Secondary | ICD-10-CM | POA: Insufficient documentation

## 2024-06-15 LAB — CBC
HCT: 34.6 % — ABNORMAL LOW (ref 36.0–46.0)
Hemoglobin: 11.8 g/dL — ABNORMAL LOW (ref 12.0–15.0)
MCH: 26.9 pg (ref 26.0–34.0)
MCHC: 34.1 g/dL (ref 30.0–36.0)
MCV: 78.8 fL — ABNORMAL LOW (ref 80.0–100.0)
Platelets: 292 K/uL (ref 150–400)
RBC: 4.39 MIL/uL (ref 3.87–5.11)
RDW: 14.8 % (ref 11.5–15.5)
WBC: 5.6 K/uL (ref 4.0–10.5)
nRBC: 0 % (ref 0.0–0.2)

## 2024-06-15 LAB — BASIC METABOLIC PANEL WITH GFR
Anion gap: 12 (ref 5–15)
BUN: <5 mg/dL — ABNORMAL LOW (ref 6–20)
CO2: 25 mmol/L (ref 22–32)
Calcium: 9.3 mg/dL (ref 8.9–10.3)
Chloride: 105 mmol/L (ref 98–111)
Creatinine, Ser: 1.08 mg/dL — ABNORMAL HIGH (ref 0.44–1.00)
GFR, Estimated: 60 mL/min — ABNORMAL LOW (ref >60)
Glucose, Bld: 120 mg/dL — ABNORMAL HIGH (ref 70–99)
Potassium: 2.9 mmol/L — ABNORMAL LOW (ref 3.5–5.1)
Sodium: 142 mmol/L (ref 135–145)

## 2024-06-15 LAB — PRO BRAIN NATRIURETIC PEPTIDE: Pro Brain Natriuretic Peptide: 70.9 pg/mL (ref <300.0)

## 2024-06-15 LAB — TROPONIN T, HIGH SENSITIVITY: Troponin T High Sensitivity: <15 ng/L (ref 0–19)

## 2024-06-15 LAB — MAGNESIUM: Magnesium: 1.5 mg/dL — ABNORMAL LOW (ref 1.7–2.4)

## 2024-06-15 MED ORDER — MAGNESIUM SULFATE IN D5W 1-5 GM/100ML-% IV SOLN
1.0000 g | Freq: Once | INTRAVENOUS | Status: DC
  Filled 2024-06-15: qty 100

## 2024-06-15 MED ORDER — MAGNESIUM SULFATE 50 % IJ SOLN
1.0000 g | Freq: Once | INTRAMUSCULAR | Status: DC
  Filled 2024-06-15: qty 2

## 2024-06-15 MED ORDER — SODIUM CHLORIDE 0.9 % IV SOLN: INTRAVENOUS | Status: DC | PRN

## 2024-06-15 MED ORDER — POTASSIUM CHLORIDE 10 MEQ/100ML IV SOLN
10.0000 meq | Freq: Once | INTRAVENOUS | Status: AC
  Administered 2024-06-15: 10 meq via INTRAVENOUS
  Filled 2024-06-15: qty 100

## 2024-06-15 MED ORDER — POTASSIUM CHLORIDE CRYS ER 20 MEQ PO TBCR
40.0000 meq | EXTENDED_RELEASE_TABLET | Freq: Once | ORAL | Status: AC
  Administered 2024-06-15: 40 meq via ORAL
  Filled 2024-06-15: qty 2

## 2024-06-15 MED ORDER — MAGNESIUM SULFATE 2 GM/50ML IV SOLN
2.0000 g | Freq: Once | INTRAVENOUS | Status: AC
  Administered 2024-06-15: 2 g via INTRAVENOUS
  Filled 2024-06-15: qty 50

## 2024-06-15 NOTE — ED Triage Notes (Signed)
 Pt c/o intermittent dyspnea on exertion x 3 days. Reports chest tightness associated with ShOB.  Denies cough, fever.   Reports increased GERD sx as well.

## 2024-06-15 NOTE — ED Provider Notes (Signed)
 Amy Perez   CSN: [id number] Arrival date & time: 06/15/24  1534  Patient presents with: Chest Pain  Amy Perez [facility] is a 58 y.o. female.  -Intermittent chest tightness/pressure over past month, none currently  -Exertional  -No orthopnea -No extremity swelling  -No fever, cough, N/V/D -Endorses poorly controlled reflux symptoms -No known hx asthma, COPD, or heart issues    Chest Pain Associated symptoms: no cough, no fever, no nausea and no vomiting     Prior to Admission medications   Medication Sig Start Date End Date Taking? Authorizing Provider  amLODipine  (NORVASC ) 10 MG tablet Take 1 tablet (10 mg total) by mouth daily. 08/19/20   Amy Barnie NOVAK, MD  atorvastatin  (LIPITOR) 20 MG tablet Take 1 tablet (20 mg total) by mouth daily. 08/19/20   Amy Barnie NOVAK, MD  celecoxib  (CELEBREX ) 200 MG capsule Take 1 capsule (200 mg total) by mouth 2 (two) times daily as needed. 03/25/21   Amy Lonni GRADE, MD  cyclobenzaprine  (FLEXERIL ) 5 MG tablet Take 1 tablet (5 mg total) by mouth daily as needed for muscle spasms. 07/19/19   Amy Barnie NOVAK, MD  escitalopram  (LEXAPRO ) 10 MG tablet TAKE 1 TABLET BY MOUTH ONCE A DAY 03/11/21     escitalopram  (LEXAPRO ) 10 MG tablet Take 1 tablet (10 mg total) by mouth daily. 11/04/21     escitalopram  (LEXAPRO ) 10 MG tablet Take 1 tablet (10 mg total) by mouth daily. 01/27/22     escitalopram  (LEXAPRO ) 10 MG tablet Take 1 tablet (10 mg total) by mouth daily. 05/10/22     fluticasone  (FLONASE ) 50 MCG/ACT nasal spray Place 1 spray into both nostrils daily. 06/26/20   Amy Barnie NOVAK, MD  hydrOXYzine  (VISTARIL ) 25 MG capsule Take 25 mg by mouth 3 (three) times daily as needed. 06/26/20   [provider]  lamoTRIgine  (LAMICTAL ) 150 MG tablet Take 1 tablet (150 mg total) by mouth daily. 11/04/21     lamoTRIgine  (LAMICTAL ) 150 MG tablet Take 1 tablet (150 mg total) by mouth  daily. 05/10/22     levocetirizine (XYZAL ) 5 MG tablet Take 1 tablet (5 mg total) by mouth every evening. 05/27/21   Amy Jennet LABOR, MD  loratadine  (CLARITIN ) 10 MG tablet Take 1 tablet (10 mg total) by mouth daily. 06/26/20   Amy Barnie NOVAK, MD  losartan  (COZAAR ) 25 MG tablet Take 1 tablet (25 mg total) by mouth daily. 08/19/20   Amy Barnie NOVAK, MD  meloxicam  (MOBIC ) 15 MG tablet Take 1 tablet (15 mg total) by mouth daily. 08/19/20   Amy Barnie NOVAK, MD  Multiple Vitamin (MULTIVITAMIN) tablet Take 1 tablet by mouth daily. Reported on 04/28/2016    [provider]  pantoprazole  (PROTONIX ) 40 MG tablet Take 1 tablet (40 mg total) by mouth daily. 08/19/20   Amy Barnie NOVAK, MD  Plecanatide  (TRULANCE ) 3 MG TABS Take 3 mg by mouth daily. 12/11/19   Amy Amy LABOR, NP  propranolol (INDERAL) 40 MG tablet Take 40-80 mg by mouth 2 (two) times daily as needed. 06/26/20   [provider]  Prucalopride Succinate  (MOTEGRITY ) 2 MG TABS Take 1 tablet (2 mg total) by mouth daily. 12/04/19   Amy Amy LABOR, NP  QUEtiapine  (SEROQUEL ) 25 MG tablet Take 1 tablet (25 mg total) by mouth at bedtime. 11/04/21       Allergies: Benadryl [diphenhydramine hcl], Milk-related compounds, Oxycodone-acetaminophen , and Penicillins    Review of Systems  Constitutional:  Negative  for fever.  Respiratory:  Positive for chest tightness. Negative for cough.   Cardiovascular:  Positive for chest pain. Negative for leg swelling.  Gastrointestinal:  Negative for diarrhea, nausea and vomiting.    Updated Vital Signs BP 125/83 (BP Location: Left Arm)   Pulse 85   Temp 98.1 F (36.7 C) (Oral)   Resp (!) 21   Ht 5' 6 (1.676 m)   Wt 113.4 kg   LMP 12/14/2017 Comment: BTL  SpO2 100%   BMI 40.35 kg/m   Physical Exam Constitutional:      General: She is not in acute distress. Cardiovascular:     Rate and Rhythm: Normal rate and regular rhythm.     Heart sounds: Normal heart sounds.  Pulmonary:      Effort: Pulmonary effort is normal.     Breath sounds: Normal breath sounds.  Abdominal:     Palpations: Abdomen is soft.     Tenderness: There is abdominal tenderness (epigastric). There is no guarding or rebound.  Musculoskeletal:     Right lower leg: No edema.     Left lower leg: No edema.  Skin:    General: Skin is warm and dry.  Neurological:     Mental Status: She is alert.     (all labs ordered are listed, but only abnormal results are displayed) Labs Reviewed  BASIC METABOLIC PANEL WITH GFR - Abnormal; Notable for the following components:      Result Value   Potassium 2.9 (*)    Glucose, Bld 120 (*)    BUN <5 (*)    Creatinine, Ser 1.08 (*)    GFR, Estimated 60 (*)    All other components within normal limits  CBC - Abnormal; Notable for the following components:   Hemoglobin 11.8 (*)    HCT 34.6 (*)    MCV 78.8 (*)    All other components within normal limits  MAGNESIUM  - Abnormal; Notable for the following components:   Magnesium  1.5 (*)    All other components within normal limits  PRO BRAIN NATRIURETIC PEPTIDE  TROPONIN T, HIGH SENSITIVITY  TROPONIN T, HIGH SENSITIVITY    EKG: No acute ischemic findings.  None  Radiology: DG Chest 2 View Result Date: 06/15/2024 CLINICAL DATA:  Chest pain and shortness of breath for several days. Nonsmoker. EXAM: CHEST - 2 VIEW COMPARISON:  04/08/2016 FINDINGS: Normal heart size and pulmonary vascularity. No focal airspace disease or consolidation in the lungs. No blunting of costophrenic angles. No pneumothorax. Mediastinal contours appear intact. Mild degenerative changes in the spine and shoulders. IMPRESSION: No active cardiopulmonary disease. Electronically Signed   By: Elsie Gravely M.D.   On: 06/15/2024 16:54     Medications Ordered in the ED  0.9 %  sodium chloride  infusion ( Intravenous New Bag/Given 06/15/24 1715)  potassium chloride  SA (KLOR-CON  M) CR tablet 40 mEq (40 mEq Oral Given 06/15/24 1648)  magnesium   sulfate IVPB 2 g 50 mL (2 g Intravenous New Bag/Given 06/15/24 1718)  potassium chloride  10 mEq in 100 mL IVPB (10 mEq Intravenous New Bag/Given 06/15/24 1717)    Medical Decision Making Amount and/or Complexity of Data Reviewed Labs: ordered. Radiology: ordered.  Risk Prescription drug management.   Carliyah Cotterman is a 58 y.o. female is here with intermittent chest pressure.    Lab Tests:  CBC: Hgb 11.8, MCV  BMP: K 2.9  Mg: 1.5 BNP: 70.9 Troponin: <15   EKG: No acute ischemic findings.   Imaging Studies  ordered:  CXR: No acute findings  Medicines ordered:  K 40mEq oral + K 10mEq IV Mg 2g   Plan Presenting with exertional chest pressure over last month. Workup unremarkable for acute cardiac or pulmonary etiology. Vitals remained stable throughout ED course. Repleted K and Mg. Overall suspect atypical chest pain, possible exertional angina. Recommend PCP follow up for further evaluation. Additionally, workup also suggestive of mild microcytic anemia, plan for PCP follow up. Patient remained stable for discharge.   Final diagnoses:  Chest pressure    ED Discharge Orders     None          Diona Perkins, MD 06/15/24 1820    Pamella Ozell LABOR, DO 06/20/24 [phone]

## 2024-06-15 NOTE — Telephone Encounter (Signed)
 FYI Only or Action Required?: Action required by provider: Refusing hospital at this time, needs call back, requesting to resume machine/care.  Patient is followed in Pulmonology for OSA, last seen on 05/12/2022 by Neda Jennet LABOR, MD.  Called Nurse Triage reporting machine question, Shortness of Breath, pressure in chest, Palpitations, and Dizziness.  Symptoms began a week ago.  Interventions attempted: Nothing.  Symptoms are: sporadic and severe.  Triage Disposition: Go to ED Now (Notify PCP)  Patient/caregiver understands and will follow disposition?: No, refuses disposition     Copied from CRM #[id number]. Topic: Clinical - Red Word Triage >> Jun 15, 2024  2:11 PM Celestine FALCON wrote: Red Word that prompted transfer to Nurse Triage: Pt stated she has shortness of breath and some pressure in her chest especially when exercising or taking a bath etc. Pt chose to stop using CPAP shortly after her visit on 05/12/2022 stopping in April 2024. She is looking for an alternative since she has difficulty with the mask.   Pt sees Dr. Neda in [place] no appt made. Reason for Disposition  [1] MODERATE difficulty breathing (e.g., speaks in phrases, SOB even at rest, pulse 100-120) AND [2] NEW-onset or WORSE than normal  Answer Assessment - Initial Assessment Questions Pt is poor historian  E2C2 Pulmonary Triage - Initial Assessment Questions Chief Complaint (e.g., cough, sob, wheezing, fever, chills, sweat or additional symptoms) *Go to specific symptom protocol after initial questions. Worse SOB than usual Some pressure in chest like panic attack, fluttering like Lasts probably 5-8 min, goes away when calm down sit down With activity or with positioning laying down, taking shower Walking or trying to do activities Just a little bit feel like might get dizzy but not lot of times Not too weak to stand No cough, runny nose, fever Fatigue Denies excessive sweating, nausea, or  headache Confirms no symptoms right now, comes and goes Want to get back on machine for OSA, stopped using over year ago  How long have symptoms been present? Started last weekend but this week just wanted to do something about it, want to get back on machine, took EKG in April and everything said heart was alright  Have you used your inhalers/maintenance medication? No  OXYGEN: Do you wear supplemental oxygen? No  Do you monitor your oxygen levels? No  6. CARDIAC HISTORY: Do you have any history of heart disease? (e.g., heart attack, angina, bypass surgery, angioplasty)      HTN not checked BP recently but says it's fine per past doc appts  7. LUNG HISTORY: Do you have any history of lung disease?  (e.g., pulmonary embolus, asthma, emphysema)     OSA    Advised pt go to hospital right away, pt refusing, states she'll go to [facility] this weekend since has to run errands today. Advised pt be examined right away, call 911 especially if worsening symptoms. Sending message to pulm for call back to schedule follow up, pt requesting to get back on machine for OSA.  Protocols used: Breathing Difficulty-A-AH

## 2024-06-15 NOTE — Discharge Instructions (Addendum)
 Your workup today was overall reassuring. Please follow up with your PCP soon about your symptoms, electrolyte levels, and possible anemia.

## 2024-06-15 NOTE — Telephone Encounter (Signed)
 Pt to ED she will call to schedule to re-establish back for CPAP

## 2024-07-12 ENCOUNTER — Ambulatory Visit (HOSPITAL_BASED_OUTPATIENT_CLINIC_OR_DEPARTMENT_OTHER): Admitting: Pulmonary Disease

## 2024-08-03 ENCOUNTER — Other Ambulatory Visit: Payer: Self-pay

## 2024-08-03 ENCOUNTER — Encounter (HOSPITAL_BASED_OUTPATIENT_CLINIC_OR_DEPARTMENT_OTHER): Payer: Self-pay

## 2024-08-03 ENCOUNTER — Emergency Department (HOSPITAL_BASED_OUTPATIENT_CLINIC_OR_DEPARTMENT_OTHER)
Admission: EM | Admit: 2024-08-03 | Discharge: 2024-08-03 | Disposition: A | Discharge status: Home / Self Care | Attending: Emergency Medicine | Admitting: Emergency Medicine

## 2024-08-03 ENCOUNTER — Emergency Department (HOSPITAL_BASED_OUTPATIENT_CLINIC_OR_DEPARTMENT_OTHER)

## 2024-08-03 DIAGNOSIS — R5383 Other fatigue: Secondary | ICD-10-CM | POA: Diagnosis present

## 2024-08-03 DIAGNOSIS — I10 Essential (primary) hypertension: Secondary | ICD-10-CM | POA: Diagnosis not present

## 2024-08-03 DIAGNOSIS — R0602 Shortness of breath: Secondary | ICD-10-CM | POA: Diagnosis not present

## 2024-08-03 DIAGNOSIS — Z79899 Other long term (current) drug therapy: Secondary | ICD-10-CM | POA: Insufficient documentation

## 2024-08-03 DIAGNOSIS — R42 Dizziness and giddiness: Secondary | ICD-10-CM | POA: Insufficient documentation

## 2024-08-03 LAB — CBC
HCT: 35.9 % — ABNORMAL LOW (ref 36.0–46.0)
Hemoglobin: 12.2 g/dL (ref 12.0–15.0)
MCH: 26.8 pg (ref 26.0–34.0)
MCHC: 34 g/dL (ref 30.0–36.0)
MCV: 78.7 fL — ABNORMAL LOW (ref 80.0–100.0)
Platelets: 336 K/uL (ref 150–400)
RBC: 4.56 MIL/uL (ref 3.87–5.11)
RDW: 15.4 % (ref 11.5–15.5)
WBC: 6.1 K/uL (ref 4.0–10.5)
nRBC: 0 % (ref 0.0–0.2)

## 2024-08-03 LAB — BASIC METABOLIC PANEL WITH GFR
Anion gap: 12 (ref 5–15)
BUN: 8 mg/dL (ref 6–20)
CO2: 25 mmol/L (ref 22–32)
Calcium: 9.4 mg/dL (ref 8.9–10.3)
Chloride: 104 mmol/L (ref 98–111)
Creatinine, Ser: 1.19 mg/dL — ABNORMAL HIGH (ref 0.44–1.00)
GFR, Estimated: 53 mL/min — ABNORMAL LOW (ref >60)
Glucose, Bld: 94 mg/dL (ref 70–99)
Potassium: 3.8 mmol/L (ref 3.5–5.1)
Sodium: 141 mmol/L (ref 135–145)

## 2024-08-03 LAB — GROUP A STREP BY PCR: Group A Strep by PCR: NOT DETECTED

## 2024-08-03 LAB — RESP PANEL BY RT-PCR (RSV, FLU A&B, COVID)  RVPGX2
Influenza A by PCR: NEGATIVE
Influenza B by PCR: NEGATIVE
Resp Syncytial Virus by PCR: NEGATIVE
SARS Coronavirus 2 by RT PCR: NEGATIVE

## 2024-08-03 NOTE — ED Triage Notes (Signed)
 Pt reports having episode of SOB on exertion and dizziness 1 month ago, was giving IV Mg and K and pt reports improved symptoms afterward. Having same symptoms starting a few days ago. Symptoms are intermittent. Associated fatigue. No acute respiratory distress noted.   Pt also reports pain in right shoulder intermittently over past week. Worse with movement. Hx of arthritis in hands. No obvious swelling/deformity.

## 2024-08-03 NOTE — Discharge Instructions (Signed)
 Your laboratories alts are within normal limits today.  Please follow-up with your primary care physician.  A copy of your laboratories alts were provided to you today.

## 2024-08-03 NOTE — ED Provider Notes (Signed)
 [facility] [facility] Provider Note   CSN: [id number] Arrival date & time: 08/03/24  1946     Patient presents with: Shortness of Breath and Dizziness   Amy Perez is a 58 y.o. female.   58 year old female with a past medical history of hypertension, stroke ulcer presents to the ED with a chief complaint of feeling fatigued.  Patient reports that for the past 2 days she has kind of felt fatigued, feels that she makes her bed and she wants to sit down and take a rest.  She reports the last time she felt like this her potassium along with her magnesium  were low.  She reports not feeling short of breath, she has not had any increase in work of breathing or chest pain.  She reports she is feeling more so like she has run down.  He does have an appointment scheduled with her PCP on Tuesday.  There is no alleviating or exacerbating factors.  She has been eating well, no problems with bowel movements.  No fever, no nausea, no vomiting.  The history is provided by the patient.  Shortness of Breath Associated symptoms: no abdominal pain, no chest pain, no fever and no vomiting   Dizziness Associated symptoms: no chest pain, no nausea, no shortness of breath and no vomiting        Prior to Admission medications   Medication Sig Start Date End Date Taking? Authorizing Provider  amLODipine  (NORVASC ) 10 MG tablet Take 1 tablet (10 mg total) by mouth daily. 08/19/20   Vicci Barnie NOVAK, MD  atorvastatin  (LIPITOR) 20 MG tablet Take 1 tablet (20 mg total) by mouth daily. 08/19/20   Vicci Barnie NOVAK, MD  celecoxib  (CELEBREX ) 200 MG capsule Take 1 capsule (200 mg total) by mouth 2 (two) times daily as needed. 03/25/21   Vernetta Lonni GRADE, MD  cyclobenzaprine  (FLEXERIL ) 5 MG tablet Take 1 tablet (5 mg total) by mouth daily as needed for muscle spasms. 07/19/19   Vicci Barnie NOVAK, MD  escitalopram  (LEXAPRO ) 10 MG tablet TAKE 1 TABLET BY MOUTH  ONCE A DAY 03/11/21     escitalopram  (LEXAPRO ) 10 MG tablet Take 1 tablet (10 mg total) by mouth daily. 11/04/21     escitalopram  (LEXAPRO ) 10 MG tablet Take 1 tablet (10 mg total) by mouth daily. 01/27/22     escitalopram  (LEXAPRO ) 10 MG tablet Take 1 tablet (10 mg total) by mouth daily. 05/10/22     fluticasone  (FLONASE ) 50 MCG/ACT nasal spray Place 1 spray into both nostrils daily. 06/26/20   Vicci Barnie NOVAK, MD  hydrOXYzine  (VISTARIL ) 25 MG capsule Take 25 mg by mouth 3 (three) times daily as needed. 06/26/20   [provider]  lamoTRIgine  (LAMICTAL ) 150 MG tablet Take 1 tablet (150 mg total) by mouth daily. 11/04/21     lamoTRIgine  (LAMICTAL ) 150 MG tablet Take 1 tablet (150 mg total) by mouth daily. 05/10/22     levocetirizine (XYZAL ) 5 MG tablet Take 1 tablet (5 mg total) by mouth every evening. 05/27/21   Neda Jennet LABOR, MD  loratadine  (CLARITIN ) 10 MG tablet Take 1 tablet (10 mg total) by mouth daily. 06/26/20   Vicci Barnie NOVAK, MD  losartan  (COZAAR ) 25 MG tablet Take 1 tablet (25 mg total) by mouth daily. 08/19/20   Vicci Barnie NOVAK, MD  meloxicam  (MOBIC ) 15 MG tablet Take 1 tablet (15 mg total) by mouth daily. 08/19/20   Vicci Barnie NOVAK, MD  Multiple Vitamin (  MULTIVITAMIN) tablet Take 1 tablet by mouth daily. Reported on 04/28/2016    [provider]  pantoprazole  (PROTONIX ) 40 MG tablet Take 1 tablet (40 mg total) by mouth daily. 08/19/20   Vicci Barnie NOVAK, MD  Plecanatide  (TRULANCE ) 3 MG TABS Take 3 mg by mouth daily. 12/11/19   Marvis Camellia LABOR, NP  propranolol (INDERAL) 40 MG tablet Take 40-80 mg by mouth 2 (two) times daily as needed. 06/26/20   [provider]  Prucalopride Succinate  (MOTEGRITY ) 2 MG TABS Take 1 tablet (2 mg total) by mouth daily. 12/04/19   Marvis Camellia LABOR, NP  QUEtiapine  (SEROQUEL ) 25 MG tablet Take 1 tablet (25 mg total) by mouth at bedtime. 11/04/21       Allergies: Benadryl [diphenhydramine hcl], Milk-related compounds,  Oxycodone-acetaminophen , and Penicillins    Review of Systems  Constitutional:  Positive for fatigue. Negative for fever.  Respiratory:  Negative for shortness of breath.   Cardiovascular:  Negative for chest pain.  Gastrointestinal:  Negative for abdominal pain, nausea and vomiting.  Neurological:  Positive for dizziness.  All other systems reviewed and are negative.   Updated Vital Signs BP 123/77   Pulse 80   Temp 98.6 F (37 C) (Oral)   Resp (!) 22   Ht 5' 6 (1.676 m)   Wt 115.7 kg   LMP 12/14/2017 Comment: BTL  SpO2 99%   BMI 41.16 kg/m   Physical Exam Vitals and nursing note reviewed.  Constitutional:      General: She is not in acute distress.    Appearance: She is well-developed.  HENT:     Head: Normocephalic and atraumatic.     Mouth/Throat:     Pharynx: No oropharyngeal exudate.  Eyes:     Pupils: Pupils are equal, round, and reactive to light.  Cardiovascular:     Rate and Rhythm: Regular rhythm.     Heart sounds: Normal heart sounds.  Pulmonary:     Effort: Pulmonary effort is normal. No respiratory distress.     Breath sounds: Normal breath sounds.  Abdominal:     General: Bowel sounds are normal. There is no distension.     Palpations: Abdomen is soft.     Tenderness: There is no abdominal tenderness.  Musculoskeletal:        General: No tenderness or deformity.     Cervical back: Normal range of motion.     Right lower leg: No edema.     Left lower leg: No edema.  Skin:    General: Skin is warm and dry.  Neurological:     Mental Status: She is alert and oriented to person, place, and time.     (all labs ordered are listed, but only abnormal results are displayed) Labs Reviewed  BASIC METABOLIC PANEL WITH GFR - Abnormal; Notable for the following components:      Result Value   Creatinine, Ser 1.19 (*)    GFR, Estimated 53 (*)    All other components within normal limits  CBC - Abnormal; Notable for the following components:   HCT 35.9  (*)    MCV 78.7 (*)    All other components within normal limits  RESP PANEL BY RT-PCR (RSV, FLU A&B, COVID)  RVPGX2  GROUP A STREP BY PCR    EKG: None  Radiology: No results found.    Procedures   Medications Ordered in the ED - No data to display  Medical Decision Making Amount and/or Complexity of Data Reviewed Labs: ordered. Radiology: ordered.   This patient presents to the ED for concern of fatigue, this involves a number of treatment options, and is a complaint that carries with it a high risk of complications and morbidity.  The differential diagnosis includes electrolyte derangement, infection, blood pressure contorl   Co morbidities: Discussed in HPI   Brief History:  See HPI.   EMR reviewed including pt PMHx, past surgical history and past visits to ER.   See HPI for more details   Lab Tests:  I ordered and independently interpreted labs.  The pertinent results include:    I personally reviewed all laboratory work and imaging. Metabolic panel without any acute abnormality specifically kidney function within normal limits and no significant electrolyte abnormalities. CBC without leukocytosis or significant anemia.   Imaging Studies:  NAD. I personally reviewed all imaging studies and no acute abnormality found. I agree with radiology interpretation.  Cardiac Monitoring:  The patient was maintained on a cardiac monitor.  I personally viewed and interpreted the cardiac monitored which showed an underlying rhythm of: Normal sinus rhythm EKG non-ischemic   Medicines ordered:  N/A  Reevaluation:  After the interventions noted above I re-evaluated patient and found that they have :stayed the same  Social Determinants of Health:  The patient's social determinants of health were a factor in the care of this patient  Problem List / ED Course:  Patient present to the ED with multiple complaints, states that she  has been feeling very fatigued over the past 2 days.  She feels like every time she is making her bed she needs to take a rest.  She reports she does not feel short of breath, she feels like her potassium and magnesium  are low which is expected how she is feeling right now.  She does report close follow-up with PCP on her schedule appointment on Tuesday.  There is no alleviating or exacerbating factors.  On primary valuation patient is overall hemodynamically stable.  There is no hypoxia, no tachycardia or tachypnea. Blood work here is unremarkable.  CBC with no leukocytosis, hemoglobin is within normal limits.  BMP is unremarkable.  Creatinine is slightly increased, BUN is within normal limits.  Respiratory panel is negative for influenza, COVID-19, RSV.  X-ray of her chest did not show any acute pathology. I did discuss the results with patient, she reports that she is feeling overall better and has a close follow-up with PCP.  She would not like any further workup at this time.  She is hemodynamically stable for discharge.  Dispostion:  After consideration of the diagnostic results and the patients response to treatment, I feel that the patent would benefit from close follow-up with PCP at her scheduled appointment.    Portions of this note were generated with Scientist, clinical (histocompatibility and immunogenetics). Dictation errors may occur despite best attempts at proofreading.   Final diagnoses:  Other fatigue    ED Discharge Orders     None          Manila Rommel, PA-C 08/05/24 2257    Emil Share, DO 08/05/24 2259

## 2024-08-03 NOTE — ED Triage Notes (Signed)
Pt also reporting sore throat.

## 2024-08-03 NOTE — ED Notes (Signed)
 Dc instructions given, pt verbalized understanding. Out of ED with steady gait, not in visible distress.

## 2024-08-27 ENCOUNTER — Encounter: Payer: Self-pay | Admitting: Radiology

## 2024-10-01 ENCOUNTER — Ambulatory Visit: Admitting: Orthopaedic Surgery

## 2024-10-24 ENCOUNTER — Ambulatory Visit: Admitting: Orthopaedic Surgery

## 2024-10-24 ENCOUNTER — Other Ambulatory Visit: Payer: Self-pay

## 2024-10-24 VITALS — Wt 271.0 lb

## 2024-10-24 DIAGNOSIS — G8929 Other chronic pain: Secondary | ICD-10-CM

## 2024-10-24 DIAGNOSIS — M25561 Pain in right knee: Secondary | ICD-10-CM

## 2024-10-24 DIAGNOSIS — M1711 Unilateral primary osteoarthritis, right knee: Secondary | ICD-10-CM | POA: Diagnosis not present

## 2024-10-24 MED ORDER — LIDOCAINE HCL 1 % IJ SOLN
3.0000 mL | INTRAMUSCULAR | Status: AC | PRN
  Administered 2024-10-24: 3 mL

## 2024-10-24 MED ORDER — METHYLPREDNISOLONE ACETATE 40 MG/ML IJ SUSP
40.0000 mg | INTRAMUSCULAR | Status: AC | PRN
  Administered 2024-10-24: 40 mg via INTRA_ARTICULAR

## 2024-10-24 NOTE — Progress Notes (Signed)
 The patient is someone we last saw a year ago.  She has chronic right knee pain.  She is only 58 years old.  A year ago we did place a steroid injection in her right knee and she is requesting 1 today.  We did feel it was appropriate to x-ray her knee today and she requested this appropriately as well.  She has a lot of problems going up and down stairs.  She has someone who is morbidly obese.  When we delayed her last year her BMI was 38.  It has gone up to 43.  On exam her right knee has varus malalignment that is correctable.  There is no effusion.  There is significant medial tenderness and patellofemoral crepitation and pain throughout the arc of motion of the right knee.  The knee is ligamentously stable.  X-rays of the right knee show tricompartment arthritis that is near bone-on-bone wear the medial compartment and patellofemoral joint with varus malalignment and osteophytes in all 3 compartments.  She does need to work on quad strengthening exercises and weight loss.  Per her request I did place a steroid injection in her right knee today and I think this is certainly reasonable.  At some point if this worsens she may consider knee replacement surgery.  She understands a weight loss is certainly appropriate as well.    Procedure Note  Patient: [facility]             Date of Birth: October 14, 1966           MRN: [id number]             Visit Date: 10/24/2024  Procedures: Visit Diagnoses:  1. Chronic pain of right knee   2. Unilateral primary osteoarthritis, right knee     Large Joint Inj: R knee on 10/24/2024 2:31 PM Indications: diagnostic evaluation and pain Details: 22 G 1.5 in needle, superolateral approach  Arthrogram: No  Medications: 3 mL lidocaine  1 %; 40 mg methylPREDNISolone  acetate 40 MG/ML Outcome: tolerated well, no immediate complications Procedure, treatment alternatives, risks and benefits explained, specific risks discussed. Consent was given by the  patient. Immediately prior to procedure a time out was called to verify the correct patient, procedure, equipment, support staff and site/side marked as required. Patient was prepped and draped in the usual sterile fashion.
